# Patient Record
Sex: Male | Born: 1945 | Race: White | Hispanic: No | Marital: Married | Smoking: Never smoker
Health system: Southern US, Community
[De-identification: ages and names within clinical notes are randomized; demographics above are authoritative.]

## PROBLEM LIST (undated history)

## (undated) DIAGNOSIS — N486 Induration penis plastica: Secondary | ICD-10-CM

## (undated) DIAGNOSIS — Z8679 Personal history of other diseases of the circulatory system: Secondary | ICD-10-CM

## (undated) DIAGNOSIS — Z9889 Other specified postprocedural states: Secondary | ICD-10-CM

## (undated) DIAGNOSIS — Z79899 Other long term (current) drug therapy: Secondary | ICD-10-CM

## (undated) DIAGNOSIS — I4819 Other persistent atrial fibrillation: Secondary | ICD-10-CM

## (undated) DIAGNOSIS — M109 Gout, unspecified: Secondary | ICD-10-CM

## (undated) DIAGNOSIS — Z5181 Encounter for therapeutic drug level monitoring: Secondary | ICD-10-CM

## (undated) DIAGNOSIS — R079 Chest pain, unspecified: Secondary | ICD-10-CM

## (undated) DIAGNOSIS — E785 Hyperlipidemia, unspecified: Secondary | ICD-10-CM

## (undated) DIAGNOSIS — Z951 Presence of aortocoronary bypass graft: Secondary | ICD-10-CM

## (undated) HISTORY — DX: Hyperlipidemia, unspecified: E78.5

## (undated) HISTORY — DX: Chest pain, unspecified: R07.9

## (undated) HISTORY — PX: TOE SURGERY: SHX1073

## (undated) HISTORY — DX: Gout, unspecified: M10.9

## (undated) HISTORY — PX: OTHER SURGICAL HISTORY: SHX169

## (undated) HISTORY — DX: Induration penis plastica: N48.6

## (undated) HISTORY — DX: Other persistent atrial fibrillation: I48.19

---

## 2013-01-08 DIAGNOSIS — H2510 Age-related nuclear cataract, unspecified eye: Secondary | ICD-10-CM | POA: Diagnosis not present

## 2014-02-11 DIAGNOSIS — H2510 Age-related nuclear cataract, unspecified eye: Secondary | ICD-10-CM | POA: Diagnosis not present

## 2014-10-16 ENCOUNTER — Ambulatory Visit (INDEPENDENT_AMBULATORY_CARE_PROVIDER_SITE_OTHER): Payer: Medicare Other | Admitting: Urgent Care

## 2014-10-16 ENCOUNTER — Encounter: Payer: Self-pay | Admitting: Urgent Care

## 2014-10-16 VITALS — BP 136/60 | HR 59 | Temp 98.3°F | Ht 72.25 in | Wt 221.8 lb

## 2014-10-16 DIAGNOSIS — S70362A Insect bite (nonvenomous), left thigh, initial encounter: Secondary | ICD-10-CM | POA: Diagnosis not present

## 2014-10-16 DIAGNOSIS — W57XXXA Bitten or stung by nonvenomous insect and other nonvenomous arthropods, initial encounter: Secondary | ICD-10-CM

## 2014-10-16 NOTE — Progress Notes (Signed)
    MRN: [id number] DOB: 1945-12-30  Subjective:   Hector Clark is a 69 y.o. male presenting for chief complaint of Insect Bite  Reports tick bite he noticed last night. 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 Has not tried any medications for relief. Denies fevers, cough, conjunctivitis, rash over arms or legs, bulls eye rash, nausea, vomiting, abdominal pain, headache. Denies any other aggravating or relieving factors, no other questions or concerns.  Hector Clark currently has no medications in their medication list. He has No Known Allergies.  [name]  has no past medical history on file. Also  has no past surgical history on file.  ROS As in subjective.  Objective:   Vitals: BP 136/60 mmHg  Pulse 59  Temp(Src) 98.3 F (36.8 C) (Oral)  Ht 6' 0.25" (1.835 m)  Wt 221 lb 12.8 oz (100.608 kg)  BMI 29.88 kg/m2  SpO2 98%  Physical Exam  Constitutional: He appears well-developed and well-nourished.  HENT:  Mouth/Throat: Oropharynx is clear and moist.  Eyes: Conjunctivae are normal. Right eye exhibits no discharge. Left eye exhibits no discharge.  Cardiovascular: Normal rate, regular rhythm and intact distal pulses.  Exam reveals no gallop and no friction rub.   No murmur heard. Pulmonary/Chest: No respiratory distress. He has no wheezes. He has no rales. He exhibits no tenderness.  Lymphadenopathy:    He has no cervical adenopathy.  Neurological: He is alert.  Skin: Skin is warm and dry. No rash (except lesions as in diagram) noted. No erythema (except as in diagram). No pallor.     Psychiatric: He has a normal mood and affect.   Assessment and Plan :   1. Tick bite of left thigh, initial encounter - Stable, counseled patient on RMSF and Lyme Disease. Patient agreed to rtc if these symptoms developed. Also counseled patient on developing cellulitis secondary to break in skin from tick bite. Patient will call if this happens. Otherwise, f/u as  needed.  Jaynee Eagles, PA-C [facility] [phone] 10/16/2014 8:43 AM

## 2014-10-16 NOTE — Patient Instructions (Signed)
[place] Spotted Fever [place] Spotted Fever (RMSF) is the oldest known tick-borne disease of people in the [place]. This disease was named because it was first described among people in the [facility] area who had an illness characterized by a rash with red-purple-black spots. This disease is caused by a rickettsia (Rickettsia rickettsii), a bacteria carried by the tick. The [facility] wood tick and the American dog tick acquire and transmit the RMSF bacteria (pictures NOT actual size). When a larval, nymphal, or adult tick feeds on an infected rodent or larger animal, the tick can become infected. Infected adult ticks then feed on people who may then get RMSF. The tick transmits the disease to humans during a prolonged period of feeding that lasts many hours, days, or even a couple weeks. The bite is painless and frequently goes unnoticed. An infected male tick may also pass the rickettsial bacteria to her eggs that then may mature to be infected adult ticks. The rickettsia that causes RMSF can also get into a person's body through damaged skin. A tick bite is not necessary. People can get RMSF if they crush a tick and get its blood or body fluids on their skin through a small cut or sore.  DIAGNOSIS Diagnosis is made by laboratory tests.  TREATMENT Treatment is with antibiotics (medications that kill rickettsia and other bacteria). Immediate treatment usually prevents death. GEOGRAPHIC RANGE This disease was reported only in the [facility] until 1931. RMSF has more recently been described among individuals in all states except [place], [place], and [place]. The highest reported incidences of RMSF now occur among residents of [place], [place], [place], and the [place]. TIME OF YEAR  Most cases are diagnosed during late spring and summer when ticks are most active. However, especially in the warmer [place] states, a few cases occur during the winter. SYMPTOMS    Symptoms of RMSF begin from 2 to 14 days after a tick bite. The most common early symptoms are fever, muscle aches, and headache followed by nausea (feeling sick to your stomach) or vomiting.  The RMSF rash is typically delayed until 3 or more days after symptom onset, and eventually develops in 9 of 10 infected patients by the fifth day of illness. If the disease is not treated it can cause death. If you get a fever, headache, muscle aches, rash, nausea, or vomiting within 2 weeks of a possible tick bite or exposure, you should see your caregiver immediately. PREVENTION Ticks prefer to hide in shady, moist ground litter. They can often be found above the ground clinging to tall grass, brush, shrubs and low tree branches. They also inhabit lawns and gardens, especially at the edges of woodlands and around old stone walls. Within the areas where ticks generally live, no naturally vegetated area can be considered completely free of infected ticks. The best precaution against RMSF is to avoid contact with soil, leaf litter, and vegetation as much as possible in tick-infested areas. For those who enjoy gardening or walking in their yards, clear brush and mow tall grass around houses and at the edges of gardens. This may help reduce the tick population in the immediate area. Applications of chemical insecticides by a licensed professional in the spring (late May) and fall (September) will also control ticks, especially in heavily infested areas. Treatment will never get rid of all the ticks. Getting rid of small animal populations that host ticks will also decrease the tick population. When working in the garden, [employer]  shrubs, or handling soil and vegetation, wear light-colored protective clothing and gloves. Spot-check often to prevent ticks from reaching the skin. Ticks cannot jump or fly. They will not drop from an above-ground perch onto a passing animal. Once a tick gains access to human skin it climbs  upward until it reaches a more protected area. For example, the back of the knee, groin, navel, armpit, ears, or nape of the neck. It then begins the slow process of embedding itself in the skin. Campers, hikers, field workers, and others who spend time in wooded, brushy, or tall grassy areas can avoid exposure to ticks by using the following precautions:  Wear light-colored clothing with a tight weave to spot ticks more easily and prevent contact with the skin.  Wear long pants tucked into socks, long-sleeved shirts tucked into pants and enclosed shoes or boots along with insect repellent.  Spray clothes with insect repellent containing either DEET or Permethrin. Only DEET can be used on exposed skin. Follow the manufacturer's directions carefully.  Wear a hat and keep long hair pulled back.  Stay on cleared, well-worn trails whenever possible.  Spot-check yourself and others often for the presence of ticks on clothes. If you find one, there are likely to be others. Check thoroughly.  Remove clothes after leaving tick-infested areas. If possible, wash them to eliminate any unseen ticks. Check yourself, your children and any pets from head to toe for the presence of ticks.  Shower and shampoo. You can greatly reduce your chances of contracting RMSF if you remove attached ticks as soon as possible. Regular checks of the body, including all body sites covered by hair (head, armpits, genitals), allow removal of the tick before rickettsial transmission. To remove an attached tick, use a forceps or tweezers to detach the intact tick without leaving mouth parts in the skin. The tick bite wound should be cleansed after tick removal. Remember the most common symptoms of RMSF are fever, muscle aches, headache, and nausea or vomiting with a later onset of rash. If you get these symptoms after a tick bite and while living in an area where RMSF is found, RMSF should be suspected. If the disease is not  treated, it can cause death. See your caregiver immediately if you get these symptoms. Do this even if not aware of a tick bite. Document Released: 08/19/2000 Document Revised: 09/21/2013 Document Reviewed: 04/11/2009 [facility] Patient Information 2015 [place], [place]. This information is not intended to replace advice given to you by your health care provider. Make sure you discuss any questions you have with your health care provider.    Lyme Disease You may have been bitten by a tick and are to watch for the development of Lyme Disease. Lyme Disease is an infection that is caused by a bacteria The bacteria causing this disease is named Borreilia burgdorferi. If a tick is infected with this bacteria and then bites you, then Lyme Disease may occur. These ticks are carried by deer and rodents such as rabbits and mice and infest grassy as well as forested areas. Fortunately most tick bites do not cause Lyme Disease.  Lyme Disease is easier to prevent than to treat. First, covering your legs with clothing when walking in areas where ticks are possibly abundant will prevent their attachment because ticks tend to stay within inches of the ground. Second, using insecticides containing DEET can be applied on skin or clothing. Last, because it takes about 12 to 24 hours for the tick to  transmit the disease after attachment to the human host, you should inspect your body for ticks twice a day when you are in areas where Lyme Disease is common. You must look thoroughly when searching for ticks. The Ixodes tick that carries Lyme Disease is very small. It is around the size of a sesame seed (picture of tick is not actual size). Removal is best done by grasping the tick by the head and pulling it out. Do not to squeeze the body of the tick. This could inject the infecting bacteria into the bite site. Wash the area of the bite with an antiseptic solution after removal.  Lyme Disease is a disease that may affect many body  systems. Because of the small size of the biting tick, most people do not notice being bitten. The first sign of an infection is usually a round red rash that extends out from the center of the tick bite. The center of the lesion may be blood colored (hemorrhagic) or have tiny blisters (vesicular). Most lesions have bright red outer borders and partial central clearing. This rash may extend out many inches in diameter, and multiple lesions may be present. Other symptoms such as fatigue, headaches, chills and fever, general achiness and swelling of lymph glands may also occur. If this first stage of the disease is left untreated, these symptoms may gradually resolve by themselves, or progressive symptoms may occur because of spread of infection to other areas of the body.  Follow up with your caregiver to have testing and treatment if you have a tick bite and you develop any of the above complaints. Your caregiver may recommend preventative (prophylactic) medications which kill bacteria (antibiotics). Once a diagnosis of Lyme Disease is made, antibiotic treatment is highly likely to cure the disease. Effective treatment of late stage Lyme Disease may require longer courses of antibiotic therapy.  MAKE SURE YOU:   Understand these instructions.  Will watch your condition.  Will get help right away if you are not doing well or get worse. Document Released: 08/13/2000 Document Revised: 07/30/2011 Document Reviewed: 10/15/2008 [facility] Patient Information 2015 [place], [place]. This information is not intended to replace advice given to you by your health care provider. Make sure you discuss any questions you have with your health care provider.

## 2014-10-28 NOTE — Progress Notes (Signed)
  Medical screening examination/treatment/procedure(s) were performed by non-physician practitioner and as supervising physician I was immediately available for consultation/collaboration.     

## 2015-11-17 DIAGNOSIS — H5213 Myopia, bilateral: Secondary | ICD-10-CM | POA: Diagnosis not present

## 2015-11-17 DIAGNOSIS — H52223 Regular astigmatism, bilateral: Secondary | ICD-10-CM | POA: Diagnosis not present

## 2015-11-17 DIAGNOSIS — D4981 Neoplasm of unspecified behavior of retina and choroid: Secondary | ICD-10-CM | POA: Diagnosis not present

## 2015-11-17 DIAGNOSIS — H2513 Age-related nuclear cataract, bilateral: Secondary | ICD-10-CM | POA: Diagnosis not present

## 2015-11-17 DIAGNOSIS — H524 Presbyopia: Secondary | ICD-10-CM | POA: Diagnosis not present

## 2016-09-17 DIAGNOSIS — R05 Cough: Secondary | ICD-10-CM | POA: Diagnosis not present

## 2016-09-17 DIAGNOSIS — R21 Rash and other nonspecific skin eruption: Secondary | ICD-10-CM | POA: Diagnosis not present

## 2016-09-25 DIAGNOSIS — B370 Candidal stomatitis: Secondary | ICD-10-CM | POA: Diagnosis not present

## 2016-10-01 ENCOUNTER — Ambulatory Visit
Admission: RE | Admit: 2016-10-01 | Discharge: 2016-10-01 | Disposition: A | Discharge status: Home / Self Care | Payer: Medicare Other | Source: Ambulatory Visit | Attending: Internal Medicine | Admitting: Internal Medicine

## 2016-10-01 ENCOUNTER — Other Ambulatory Visit: Payer: Self-pay | Admitting: Internal Medicine

## 2016-10-01 DIAGNOSIS — R059 Cough, unspecified: Secondary | ICD-10-CM

## 2016-10-01 DIAGNOSIS — R05 Cough: Secondary | ICD-10-CM

## 2016-10-01 DIAGNOSIS — R938 Abnormal findings on diagnostic imaging of other specified body structures: Secondary | ICD-10-CM | POA: Diagnosis not present

## 2016-10-01 DIAGNOSIS — J189 Pneumonia, unspecified organism: Secondary | ICD-10-CM | POA: Diagnosis not present

## 2016-10-22 ENCOUNTER — Other Ambulatory Visit: Payer: Self-pay | Admitting: Internal Medicine

## 2016-10-22 ENCOUNTER — Ambulatory Visit
Admission: RE | Admit: 2016-10-22 | Discharge: 2016-10-22 | Disposition: A | Discharge status: Home / Self Care | Payer: Medicare Other | Source: Ambulatory Visit | Attending: Internal Medicine | Admitting: Internal Medicine

## 2016-10-22 DIAGNOSIS — J189 Pneumonia, unspecified organism: Secondary | ICD-10-CM

## 2016-11-09 DIAGNOSIS — R079 Chest pain, unspecified: Secondary | ICD-10-CM | POA: Diagnosis not present

## 2016-12-03 ENCOUNTER — Encounter: Payer: Self-pay | Admitting: *Deleted

## 2016-12-14 ENCOUNTER — Ambulatory Visit (INDEPENDENT_AMBULATORY_CARE_PROVIDER_SITE_OTHER): Payer: Medicare Other | Admitting: Internal Medicine

## 2016-12-14 ENCOUNTER — Encounter: Payer: Self-pay | Admitting: *Deleted

## 2016-12-14 VITALS — BP 118/62 | HR 125 | Ht 72.25 in | Wt 226.0 lb

## 2016-12-14 DIAGNOSIS — I480 Paroxysmal atrial fibrillation: Secondary | ICD-10-CM

## 2016-12-14 DIAGNOSIS — R0789 Other chest pain: Secondary | ICD-10-CM | POA: Diagnosis not present

## 2016-12-14 MED ORDER — METOPROLOL TARTRATE 25 MG PO TABS: ORAL_TABLET | ORAL | 1 refills | Status: DC

## 2016-12-14 NOTE — Progress Notes (Signed)
New Outpatient Visit Date: 12/14/2016  Referring Provider: Lavone Orn, MD [address] [place] [address], [place] [address]  Chief Complaint: Chest pain  HPI:  Hector Clark is a 71 y.o. male who is being seen today for the evaluation of chest pain at the request of Dr. Laurann Montana. He has a history of hyperlipidemia (not currently on therapy) and gout. For approximately 10 years, Hector Clark has had episodic chest pains. The first episode occurred while playing golf. He developed a cramping sensation in the chest rating to both arms and shoulders. The pain would come on spontaneously without relation to exertion or other activities. It is maximal intensity has been 4/10, with the pain typically resolving on its own after about 45 seconds. However, for the next 5-10 minutes, Hector Clark reports feeling "cruddy." Shortly after the symptoms began, he underwent myocardial perfusion stress test that was normal per his report. He also reports a left bundle branch block in the past. Since the symptoms started about 10 years ago, he has periodically experienced the same chest pain up to 2-3 times per year. Most recent episode occurred in June and was witnessed by his wife. She encouraged him to be evaluated by Dr. Laurann Montana, who has referred Hector Clark to [place]. At the time of Dr. Delene Clark assessment, evaluation was normal, including EKG demonstrating sinus bradycardia without significant abnormalities.  Over the last month, Hector Clark has not had any further chest pain. He also denies shortness of breath, palpitations, lightheadedness, edema, orthopnea, and PND. 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 At times, he has exertional dyspnea when climbing up stairs carrying 3 laptop's. Up until about 8-10 years ago, he was very active, including training for marathons and triathlons.  Hector Clark denies a history of arrhythmia, though he  notes in the past when exercising, his heart monitor would occasionally go "berserk," showing fast and irregular heartbeats.  --------------------------------------------------------------------------------------------------  Cardiovascular History & Procedures: Cardiovascular Problems:  Atypical chest pain  Atrial fibrillation  Risk Factors:  Male gender, hyperlipidemia, and age greater than 15  Cath/PCI:  None  CV Surgery:  None  EP Procedures and Devices:  None  Non-Invasive Evaluation(s):  Myocardial perfusion stress test approximately 10 years ago: Normal per patient report.  Recent CV Pertinent Labs: No results found for: CHOL, HDL, LDLCALC, LDLDIRECT, TRIG, CHOLHDL, INR, BNP, K, MG, BUN, CREATININE  --------------------------------------------------------------------------------------------------  Past Medical History:  Diagnosis Date  . Chest pain    with nagative cardiolite  . Gout    occational flare  . Hyperlipemia   . Peyronie disease     Past Surgical History:  Procedure Laterality Date  . bone graph    . TOE SURGERY Left    4th toe  . tophus      No outpatient prescriptions have been marked as taking for the 12/14/16 encounter (Office Visit) with Sherley Mckenney, Harrell Gave, MD.    Allergies: Patient has no known allergies.  Social History   Social History  . Marital status: Married    Spouse name: N/A  . Number of children: N/A  . Years of education: N/A   Occupational History  . Not on file.   Social History Main Topics  . Smoking status: Never Smoker  . Smokeless tobacco: Never Used  . Alcohol use Yes     Comment: 2-3 weekly  . Drug use: No  . Sexual activity: Not on file   Other  Topics Concern  . Not on file   Social History Narrative  . No narrative on file    Family History  Problem Relation Age of Onset  . Liver cancer Mother 42  . Esophageal cancer Father 53  . Healthy Daughter   . Heart disease Neg Hx     Review  of Systems: A 12-system review of systems was performed and was negative except as noted in the HPI.  --------------------------------------------------------------------------------------------------  Physical Exam: BP 118/62   Pulse (!) 125   Ht 6' 0.25" (1.835 m)   Wt 226 lb (102.5 kg)   SpO2 98%   BMI 30.44 kg/m   General:  Overweight man, seated comfortably in the exam room. HEENT: No conjunctival pallor or scleral icterus. Moist mucous membranes. OP clear. Neck: Supple without lymphadenopathy, thyromegaly, JVD, or HJR. No carotid bruit. Lungs: Normal work of breathing. Clear to auscultation bilaterally without wheezes or crackles. Heart: Tachycardic and irregularly irregular. No murmurs or rubs.. Non-displaced PMI. Abd: Bowel sounds present. Soft, NT/ND without hepatosplenomegaly Ext: No lower extremity edema. Radial, PT, and DP pulses are 2+ bilaterally Skin: Warm and dry without rash. Neuro: CNIII-XII intact. Strength and fine-touch sensation intact in upper and lower extremities bilaterally. Psych: Normal mood and affect.  EKG:  Atrial fibrillation with rapid ventricular response (ventricular rate 120 bpm). Compared with prior tracing from 11/09/16, atrial fibrillation has replaced sinus bradycardia (heart rate 57 bpm).  No results found for: WBC, HGB, HCT, MCV, PLT  No results found for: NA, K, CL, CO2, BUN, CREATININE, GLUCOSE, ALT  No results found for: CHOL, HDL, LDLCALC, LDLDIRECT, TRIG, CHOLHDL   --------------------------------------------------------------------------------------------------  ASSESSMENT AND PLAN: Atypical chest pain Chest pain has been long-standing and very infrequent, happening about 2-3 times a year. The fact that the pain typically occurs at rest and resolves within 45 seconds, argues against coronary insufficiency. However, Hector Clark has several cardiac risk factors, including age, male gender, and history of hyperlipidemia (though he  does not recall a history of elevated cholesterol). Given finding of new atrial fibrillation today, I would like to improve his heart rate control and obtain a transthoracic echocardiogram first. If his LVEF is normal, we will proceed with a myocardial perfusion stress test to exclude ischemia. If his LVEF is depressed, we will proceed with cardiac catheterization instead. I have asked Hector Clark to start taking aspirin 81 mg daily. We will also add low-dose metoprolol.  New onset atrial fibrillation Hector Clark does not have any symptoms despite being in atrial fibrillation with rapid ventricular response. This has not been documented in the past, though the report of his heart monitor going "berserk" in the past suggests that he may have had paroxysmal atrial fibrillation for years. We have agreed to start low-dose aspirin given his CHADSVASC score of 1 (age). We will also start metoprolol tartrate 12.5 mg twice a day for heart rate control. I will have him return to the clinic early next week for a repeat EKG to reassess his heart rate control. We will check a CBC, CMP, and TSH today.  Follow-up: Return to clinic in 1 month.  Nelva Bush, MD 12/14/2016 4:46 PM

## 2016-12-14 NOTE — Patient Instructions (Signed)
Medication Instructions:  Start aspirin 81 mg daily  Start metoprolol tartrate 12.5 mg two times a day This will be 1/2 of a 25 mg tablet two times a day  Labwork: CMET/CBCd/TSH/Magnesium level today  Testing/Procedures: Your physician has requested that you have an echocardiogram. Echocardiography is a painless test that uses sound waves to create images of your heart. It provides your doctor with information about the size and shape of your heart and how well your heart's chambers and valves are working. This procedure takes approximately one hour. There are no restrictions for this procedure.    Follow-Up: Your physician recommends that you schedule a follow-up appointment the first of the week for an EKG.  Your physician recommends that you schedule a follow-up appointment in: 1 month with Dr End.         If you need a refill on your cardiac medications before your next appointment, please call your pharmacy.

## 2016-12-15 DIAGNOSIS — I4819 Other persistent atrial fibrillation: Secondary | ICD-10-CM | POA: Insufficient documentation

## 2016-12-15 DIAGNOSIS — R0789 Other chest pain: Secondary | ICD-10-CM | POA: Insufficient documentation

## 2016-12-15 LAB — CBC WITH DIFFERENTIAL/PLATELET
BASOS ABS: 0.1 10*3/uL (ref 0.0–0.2)
Basos: 1 %
EOS (ABSOLUTE): 0.1 10*3/uL (ref 0.0–0.4)
Eos: 2 %
HEMOGLOBIN: 16.7 g/dL (ref 13.0–17.7)
Hematocrit: 50.2 % (ref 37.5–51.0)
Immature Grans (Abs): 0 10*3/uL (ref 0.0–0.1)
Immature Granulocytes: 0 %
LYMPHS ABS: 3.3 10*3/uL — AB (ref 0.7–3.1)
LYMPHS: 35 %
MCH: 30 pg (ref 26.6–33.0)
MCHC: 33.3 g/dL (ref 31.5–35.7)
MCV: 90 fL (ref 79–97)
Monocytes Absolute: 0.6 10*3/uL (ref 0.1–0.9)
Monocytes: 7 %
Neutrophils Absolute: 5.3 10*3/uL (ref 1.4–7.0)
Neutrophils: 55 %
PLATELETS: 211 10*3/uL (ref 150–379)
RBC: 5.57 x10E6/uL (ref 4.14–5.80)
RDW: 13.7 % (ref 12.3–15.4)
WBC: 9.4 10*3/uL (ref 3.4–10.8)

## 2016-12-15 LAB — COMPREHENSIVE METABOLIC PANEL
ALT: 23 IU/L (ref 0–44)
AST: 21 IU/L (ref 0–40)
Albumin/Globulin Ratio: 1.5 (ref 1.2–2.2)
Albumin: 4.7 g/dL (ref 3.6–4.8)
Alkaline Phosphatase: 119 IU/L — ABNORMAL HIGH (ref 39–117)
BUN/Creatinine Ratio: 15 (ref 10–24)
BUN: 14 mg/dL (ref 8–27)
Bilirubin Total: 0.5 mg/dL (ref 0.0–1.2)
CALCIUM: 9.8 mg/dL (ref 8.6–10.2)
CO2: 19 mmol/L — AB (ref 20–29)
CREATININE: 0.95 mg/dL (ref 0.76–1.27)
Chloride: 102 mmol/L (ref 96–106)
GFR calc Af Amer: 94 mL/min/{1.73_m2} (ref >59)
GFR, EST NON AFRICAN AMERICAN: 81 mL/min/{1.73_m2} (ref >59)
GLOBULIN, TOTAL: 3.1 g/dL (ref 1.5–4.5)
Glucose: 98 mg/dL (ref 65–99)
Potassium: 4.6 mmol/L (ref 3.5–5.2)
SODIUM: 141 mmol/L (ref 134–144)
Total Protein: 7.8 g/dL (ref 6.0–8.5)

## 2016-12-15 LAB — MAGNESIUM: MAGNESIUM: 2.1 mg/dL (ref 1.6–2.3)

## 2016-12-15 LAB — TSH: TSH: 2.85 u[IU]/mL (ref 0.450–4.500)

## 2016-12-20 ENCOUNTER — Ambulatory Visit (HOSPITAL_COMMUNITY): Payer: Medicare Other | Attending: Cardiovascular Disease

## 2016-12-20 ENCOUNTER — Other Ambulatory Visit: Payer: Self-pay

## 2016-12-20 ENCOUNTER — Ambulatory Visit (INDEPENDENT_AMBULATORY_CARE_PROVIDER_SITE_OTHER): Payer: Medicare Other | Admitting: *Deleted

## 2016-12-20 VITALS — BP 90/64 | HR 105

## 2016-12-20 DIAGNOSIS — I340 Nonrheumatic mitral (valve) insufficiency: Secondary | ICD-10-CM | POA: Diagnosis not present

## 2016-12-20 DIAGNOSIS — I480 Paroxysmal atrial fibrillation: Secondary | ICD-10-CM

## 2016-12-20 DIAGNOSIS — R0789 Other chest pain: Secondary | ICD-10-CM

## 2016-12-20 LAB — ECHOCARDIOGRAM COMPLETE

## 2016-12-20 MED ORDER — METOPROLOL TARTRATE 25 MG PO TABS: ORAL_TABLET | ORAL | 1 refills | Status: DC

## 2016-12-20 NOTE — Patient Instructions (Signed)
Pt to increased the Metoprolol medication to 25 mg twice a day. Pt has an appointment with the A-Fib clinic with [name] on August 9 th at 11:30 AM  A-Fib clinic phone [phone].

## 2016-12-20 NOTE — Progress Notes (Signed)
1.) Reason for visit: 12 lead EKG  2.) Name of MD requesting visit: Dr.Christopher End  3.) H&P: Pt has a history of chest pain.  4.) ROS related to problem:Atrial fibrillation  5.) Assessment and plan per MD:EKG done per CMA read per DR Marlou Porch MD. A-fib rate 107 beats/ minute, BP left arm 91/63. Dr Marlou Porch recommends to increased Metoprolol to 25 mg twice a day and to be F/U with [name] in the A-Fib clinic in  Week.. Pt has an appointment  At the A-Fib clinic on August 9 th at 11:30 AM. Pt is aware.Hector Clark

## 2016-12-24 ENCOUNTER — Telehealth: Payer: Self-pay | Admitting: *Deleted

## 2016-12-24 DIAGNOSIS — I480 Paroxysmal atrial fibrillation: Secondary | ICD-10-CM

## 2016-12-24 DIAGNOSIS — R0789 Other chest pain: Secondary | ICD-10-CM

## 2016-12-24 NOTE — Telephone Encounter (Signed)
Notes recorded by End, Harrell Gave, MD on 12/23/2016 at 8:35 PM EDT Please let Hector Clark know that his echo shows normal pumping function of his heart. Mild leakage of the mitral valve was noted, which is unlikely to explain his symptoms. I recommend that we proceed with a pharmacologic myocardial perfusion stress test. He should follow-up with the a-fib clinic and me as previously arranged. Thanks.

## 2016-12-27 ENCOUNTER — Ambulatory Visit (HOSPITAL_COMMUNITY)
Admission: RE | Admit: 2016-12-27 | Discharge: 2016-12-27 | Disposition: A | Discharge status: Home / Self Care | Payer: Medicare Other | Source: Ambulatory Visit | Attending: Nurse Practitioner | Admitting: Nurse Practitioner

## 2016-12-27 ENCOUNTER — Encounter (HOSPITAL_COMMUNITY): Payer: Self-pay | Admitting: Nurse Practitioner

## 2016-12-27 VITALS — BP 112/70 | HR 117 | Ht 72.25 in | Wt 226.2 lb

## 2016-12-27 DIAGNOSIS — I4819 Other persistent atrial fibrillation: Secondary | ICD-10-CM

## 2016-12-27 DIAGNOSIS — Z79899 Other long term (current) drug therapy: Secondary | ICD-10-CM | POA: Diagnosis not present

## 2016-12-27 DIAGNOSIS — Z7982 Long term (current) use of aspirin: Secondary | ICD-10-CM | POA: Diagnosis not present

## 2016-12-27 DIAGNOSIS — I4891 Unspecified atrial fibrillation: Secondary | ICD-10-CM | POA: Diagnosis present

## 2016-12-27 DIAGNOSIS — E785 Hyperlipidemia, unspecified: Secondary | ICD-10-CM | POA: Diagnosis not present

## 2016-12-27 DIAGNOSIS — I481 Persistent atrial fibrillation: Secondary | ICD-10-CM | POA: Diagnosis not present

## 2016-12-27 NOTE — Progress Notes (Signed)
22

## 2016-12-28 NOTE — Progress Notes (Addendum)
Primary Care Physician: Hector Orn, MD Referring Physician: Dr. Saunders Clark Cardiologist: Dr. Shirlyn Clark Hector Clark is a 71 y.o. male with a h/o hyperlipidemia that recently has been diagnosed with afib, although pt has been having episodes of irregular heart beat for years but short lived. Twice he can remember in the last year that he had fast heart beat following playing golf and drinking a beer. However, he is does not consume a lot of alcohol, caffeine. No tobacco. Does not think he snores. He has a chadsvasc score of , not on anticoagulation. He has noted smore fatigue and dyspnea with activities. HR is 117 today but misses am metoprolol. He is not watching BP or HR at home. He usually runs a low blood pressure. He has also reported to Dr. Saunders Clark that he has chest, neck, shoulder discomfort when he moves the yard and is relieved with rest. He is pending a atress test. Echo with normal EF, mild MR.   Today, he denies symptoms of palpitations, chest pain, shortness of breath, orthopnea, PND, lower extremity edema, dizziness, presyncope, syncope, or neurologic sequela. The patient is tolerating medications without difficulties and is otherwise without complaint today.   Past Medical History:  Diagnosis Date  . Chest pain    with nagative cardiolite  . Gout    occational flare  . Hyperlipemia   . Peyronie disease    Past Surgical History:  Procedure Laterality Date  . bone graph    . TOE SURGERY Left    4th toe  . tophus      Current Outpatient Prescriptions  Medication Sig Dispense Refill  . aspirin EC 81 MG tablet Take 1 tablet (81 mg total) by mouth daily.    . metoprolol tartrate (LOPRESSOR) 25 MG tablet 1 tablet  by mouth two times a day 90 tablet 1   No current facility-administered medications for this encounter.     No Known Allergies  Social History   Social History  . Marital status: Married    Spouse name: N/A  . Number of children: N/A  . Years of education: N/A    Occupational History  . Not on file.   Social History Main Topics  . Smoking status: Never Smoker  . Smokeless tobacco: Never Used  . Alcohol use Yes     Comment: 1 beer per month  . Drug use: No  . Sexual activity: Not on file   Other Topics Concern  . Not on file   Social History Narrative  . No narrative on file    Family History  Problem Relation Age of Onset  . Liver cancer Mother 45  . Esophageal cancer Father 74  . Healthy Daughter   . Heart disease Neg Hx     ROS- All systems are reviewed and negative except as per the HPI above  Physical Exam: Vitals:   12/27/16 1129  BP: 112/70  Pulse: (!) 117  Weight: 226 lb 3.2 oz (102.6 kg)  Height: 6' 0.25" (1.835 m)   Wt Readings from Last 3 Encounters:  12/27/16 226 lb 3.2 oz (102.6 kg)  12/14/16 226 lb (102.5 kg)  10/16/14 221 lb 12.8 oz (100.6 kg)    Labs: Lab Results  Component Value Date   NA 141 12/14/2016   K 4.6 12/14/2016   CL 102 12/14/2016   CO2 19 (L) 12/14/2016   GLUCOSE 98 12/14/2016   BUN 14 12/14/2016   CREATININE 0.95 12/14/2016   CALCIUM 9.8 12/14/2016  MG 2.1 12/14/2016   No results found for: INR No results found for: CHOL, HDL, LDLCALC, TRIG   GEN- The patient is well appearing, alert and oriented x 3 today.   Head- normocephalic, atraumatic Eyes-  Sclera clear, conjunctiva pink Ears- hearing intact Oropharynx- clear Neck- supple, no JVP Lymph- no cervical lymphadenopathy Lungs- Clear to ausculation bilaterally, normal work of breathing Heart- irregular rate and rhythm, no murmurs, rubs or gallops, PMI not laterally displaced GI- soft, NT, ND, + BS Extremities- no clubbing, cyanosis, or edema MS- no significant deformity or atrophy Skin- no rash or lesion Psych- euthymic mood, full affect Neuro- strength and sensation are intact  EKG- afib at 117 bpm, Qrs int 88 ms, qtc 460 Echo- Study Conclusions  - Left ventricle: The cavity size was normal. Wall thickness was    normal. Systolic function was normal. The estimated ejection   fraction was in the range of 50% to 55%. The study is not   technically sufficient to allow evaluation of LV diastolic   function. - Mitral valve: There was mild regurgitation. - Atrial septum: No defect or patent foramen ovale was identified.   Assessment and Plan: 1. New onset persistent afib General education re afib management Poorly rate controlled today but missed am metoprolol dose Pt would prefer not to increase med or add new med at this time He will check his HR and BP over the next few days and inform office if his HR is consistently over 100 bpm, for consideration for increase in metoprolol, if BP is adequate Chadsvasc score is 1, not on anticoagulation, on ASA Explained to pt since he is proving to be persistent, he would require anticoagulation with a DOAC for 3 weeks before cardioversion or AAD could be attempted He defers for now He would like to get thru stress test scheduled for next Friday and then he will see if he wants to  go on anticoagulation  F/u with Hector Clark, Hector Clark 8/31 afib clinic as needed   Hector Penny C. Mekai Clark, [facility] [address] [place], [place] [address] [phone]

## 2016-12-31 ENCOUNTER — Encounter: Payer: Self-pay | Admitting: Physician Assistant

## 2017-01-01 ENCOUNTER — Telehealth (HOSPITAL_COMMUNITY): Payer: Self-pay | Admitting: *Deleted

## 2017-01-01 NOTE — Telephone Encounter (Signed)
Patient given detailed instructions per Myocardial Perfusion Study Information Sheet for the test on 01/01/17 Patient notified to arrive 15 minutes early and that it is imperative to arrive on time for appointment to keep from having the test rescheduled.  If you need to cancel or reschedule your appointment, please call the office within 24 hours of your appointment. . Patient verbalized understanding. Kirstie Peri

## 2017-01-04 ENCOUNTER — Ambulatory Visit (HOSPITAL_COMMUNITY): Payer: Medicare Other | Attending: Internal Medicine

## 2017-01-04 DIAGNOSIS — I480 Paroxysmal atrial fibrillation: Secondary | ICD-10-CM | POA: Insufficient documentation

## 2017-01-04 DIAGNOSIS — R0789 Other chest pain: Secondary | ICD-10-CM | POA: Diagnosis not present

## 2017-01-04 MED ORDER — REGADENOSON 0.4 MG/5ML IV SOLN
0.4000 mg | Freq: Once | INTRAVENOUS | Status: AC
  Administered 2017-01-04: 0.4 mg via INTRAVENOUS

## 2017-01-04 MED ORDER — TECHNETIUM TC 99M TETROFOSMIN IV KIT
32.4000 | PACK | Freq: Once | INTRAVENOUS | Status: AC | PRN
  Administered 2017-01-04: 32.4 via INTRAVENOUS
  Filled 2017-01-04: qty 33

## 2017-01-04 MED ORDER — TECHNETIUM TC 99M TETROFOSMIN IV KIT
10.1000 | PACK | Freq: Once | INTRAVENOUS | Status: AC | PRN
  Administered 2017-01-04: 10.1 via INTRAVENOUS
  Filled 2017-01-04: qty 11

## 2017-01-07 LAB — MYOCARDIAL PERFUSION IMAGING
CHL CUP NUCLEAR SDS: 6
CHL CUP NUCLEAR SRS: 3
CHL CUP NUCLEAR SSS: 9
CSEPPHR: 111 {beats}/min
RATE: 0.31
Rest HR: 88 {beats}/min
TID: 1.16

## 2017-01-18 ENCOUNTER — Encounter: Payer: Self-pay | Admitting: Physician Assistant

## 2017-01-18 ENCOUNTER — Ambulatory Visit (INDEPENDENT_AMBULATORY_CARE_PROVIDER_SITE_OTHER): Payer: Medicare Other | Admitting: Physician Assistant

## 2017-01-18 VITALS — BP 98/60 | HR 98 | Ht 73.5 in | Wt 229.8 lb

## 2017-01-18 DIAGNOSIS — I481 Persistent atrial fibrillation: Secondary | ICD-10-CM | POA: Diagnosis not present

## 2017-01-18 DIAGNOSIS — R0789 Other chest pain: Secondary | ICD-10-CM

## 2017-01-18 DIAGNOSIS — I4819 Other persistent atrial fibrillation: Secondary | ICD-10-CM

## 2017-01-18 NOTE — Patient Instructions (Signed)
Medication Instructions:  Your physician recommends that you continue on your current medications as directed. Please refer to the Current Medication list given to you today.   Labwork: NONE ORDERED   Testing/Procedures: 1. Your physician has recommended that you wear a 24 HOUR holter monitor. Holter monitors are medical devices that record the heart's electrical activity. Doctors most often use these monitors to diagnose arrhythmias. Arrhythmias are problems with the speed or rhythm of the heartbeat. The monitor is a small, portable device. You can wear one while you do your normal daily activities. This is usually used to diagnose what is causing palpitations/syncope (passing out).    Follow-Up: DR. END IN 6-8 WEEKS  Any Other Special Instructions Will Be Listed Below (If Applicable).     If you need a refill on your cardiac medications before your next appointment, please call your pharmacy.

## 2017-01-18 NOTE — Progress Notes (Signed)
Cardiology Office Note:    Date:  01/18/2017   ID:  Hector Clark, DOB [birth year], MRN [id number]  PCP:  Lavone Orn, MD  Cardiologist:  Dr. Nelva Bush    Referring MD: Lavone Orn, MD   Chief Complaint  Patient presents with  . Atrial Fibrillation    follow up    History of Present Illness:    Hector Clark is a 71 y.o. male who was evaluated by Dr. Saunders Revel 12/14/16 for chest discomfort. At that visit, he was noted to be in AF with RVR.  CHADS2-VASc=1 (age).  He was placed on ASA and beta-blocker for rate control.   He was evaluated in the [facility] 12/27/16. Heart rate was still uncontrolled at that time. However, the patient deferred adjusting his rate controlling medications. He also deferred consideration for short-term anticoagulation and elective cardioversion. Echo 12/20/16 demonstrated normal LV function and mild MR. Stress testing 01/04/17 was low risk and negative for ischemia.  Hector Clark returns for follow-up. He is here alone. He notes a long history of dyspnea with exertion. This has been going on for about 4-5 years. He denies any significant change. He denies a significant change in his occasional chest discomfort. He denies syncope, orthopnea, PND or edema. He denies any bleeding issues.  Prior CV studies:   The following studies were reviewed today:  Nuclear stress test 01/04/17 Low risk stress nuclear study with apical thinning vs small prior infarct; no significant ischemia; study not gated due to atrial fibrillation.  Echo 12/20/16 EF 50-55, mild MR  Past Medical History:  Diagnosis Date  . Chest pain    with nagative cardiolite  . Gout    occational flare  . Hyperlipemia   . Peyronie disease     Past Surgical History:  Procedure Laterality Date  . bone graph    . TOE SURGERY Left    4th toe  . tophus      Current Medications: Current Meds  Medication Sig  . aspirin EC 81 MG tablet Take 1 tablet (81 mg total) by mouth  daily.  . metoprolol tartrate (LOPRESSOR) 25 MG tablet 1 tablet  by mouth two times a day     Allergies:   Patient has no known allergies.   Social History   Social History  . Marital status: Married    Spouse name: N/A  . Number of children: N/A  . Years of education: N/A   Social History Main Topics  . Smoking status: Never Smoker  . Smokeless tobacco: Never Used  . Alcohol use Yes     Comment: 1 beer per month  . Drug use: No  . Sexual activity: Not Asked   Other Topics Concern  . None   Social History Narrative  . None     Family Hx: The patient's family history includes Esophageal cancer (age of onset: 69) in his father; Healthy in his daughter; Liver cancer (age of onset: 33) in his mother. There is no history of Heart disease.  ROS:   Please see the history of present illness.    Review of Systems  Cardiovascular: Positive for irregular heartbeat.   All other systems reviewed and are negative.   EKGs/Labs/Other Test Reviewed:    EKG:  EKG is  ordered today.  The ekg ordered today demonstrates Atrial fibrillation, HR 99  Recent Labs: 12/14/2016: ALT 23; BUN 14; Creatinine, Ser 0.95; Hemoglobin 16.7; Magnesium 2.1; Platelets 211; Potassium 4.6; Sodium 141; TSH 2.850  Recent Lipid Panel No results found for: CHOL, TRIG, HDL, CHOLHDL, LDLCALC, LDLDIRECT  Physical Exam:    VS:  BP 98/60   Pulse 98   Ht 6' 1.5" (1.867 m)   Wt 229 lb 12.8 oz (104.2 kg)   SpO2 97%   BMI 29.91 kg/m     Wt Readings from Last 3 Encounters:  01/18/17 229 lb 12.8 oz (104.2 kg)  12/27/16 226 lb 3.2 oz (102.6 kg)  12/14/16 226 lb (102.5 kg)     Physical Exam  Constitutional: He is oriented to person, place, and time. He appears well-developed and well-nourished. No distress.  HENT:  Head: Normocephalic and atraumatic.  Eyes: No scleral icterus.  Neck: No JVD present.  Cardiovascular: Normal rate.  An irregularly irregular rhythm present.  No murmur  heard. Pulmonary/Chest: Effort normal. He has no rales.  Abdominal: Soft. There is no tenderness.  Musculoskeletal: He exhibits no edema.  Neurological: He is alert and oriented to person, place, and time.  Skin: Skin is warm and dry.  Psychiatric: He has a normal mood and affect.    ASSESSMENT:    1. Persistent atrial fibrillation ([place])   2. Atypical chest pain    PLAN:    In order of problems listed above:  1. Persistent atrial fibrillation [facility]) Mr. Golob remains in atrial fibrillation. Heart rate is somewhat better controlled today. He remains on Metoprolol 25 mg twice a day.  CHADS2-VASc=1.  He remains on aspirin. I question if his dyspnea with exertion is a symptom related to atrial fibrillation. He was in normal sinus rhythm in June when he saw his PCP. However, he has been in atrial fibrillation since that time. We had a long discussion regarding whether or not to proceed with elective cardioversion. He would require short-term anticoagulation around this. I also discussed the possibility of placing him on flecainide to maintain rhythm control. Overall, at this point, he would prefer to continue his current therapy and consider options. I have asked him to undergo 24-hour Holter monitor to assess heart rate control. If his heart rate is uncontrolled, we may need to consider proceeding with cardioversion sooner or adding something like digoxin. However, I would prefer to not use digoxin in someone with normal LV function. I will have him follow-up with Dr. Saunders Revel in 2 months.   2. Atypical chest pain Chronic occ chest pain. Recent Nuclear stress test low risk.    Total time spent with patient today 30 minutes. This includes reviewing records, evaluating the patient and coordinating care. Face-to-face time >50%.   Dispo:  Return in about 8 weeks (around 03/15/2017) for Routine Follow Up, w/ Dr. Saunders Revel.   Medication Adjustments/Labs and Tests Ordered: Current medicines are reviewed  at length with the patient today.  Concerns regarding medicines are outlined above.  Tests Ordered: Orders Placed This Encounter  Procedures  . Holter monitor - 24 hour  . EKG 12-Lead   Medication Changes: No orders of the defined types were placed in this encounter.   Signed, [name], PA-C  01/18/2017 9:50 AM    [facility] [place], [place], [place]  [address] Phone: [phone]; Fax: [phone]

## 2017-01-22 ENCOUNTER — Ambulatory Visit (INDEPENDENT_AMBULATORY_CARE_PROVIDER_SITE_OTHER): Payer: Medicare Other

## 2017-01-22 DIAGNOSIS — I481 Persistent atrial fibrillation: Secondary | ICD-10-CM

## 2017-01-22 DIAGNOSIS — I4819 Other persistent atrial fibrillation: Secondary | ICD-10-CM

## 2017-01-28 ENCOUNTER — Telehealth: Payer: Self-pay | Admitting: Physician Assistant

## 2017-01-28 NOTE — Telephone Encounter (Signed)
Please call patient.  Dr. Harrell Gave End reviewed his Holter monitor. Hector Clark has a lot of heart rate variability and some fast beats that may arise from his L ventricle. We think that we should try to go ahead and attempt cardioversion to get him back in rhythm. We will need to start anticoagulation and then see him back in 4 weeks to schedule the DCCV. Please set him up to see me in the next week or 2.  It would be good to put him on with me a day that Dr. Saunders Revel is in the office.  We can decide on which anticoagulant to take and discuss the procedure in detail at that visit. [name], PA-C    01/28/2017 4:14 PM

## 2017-01-29 NOTE — Telephone Encounter (Signed)
PER SCOTT W. PT TO BE SEEN SAME DAY DR. END IS IN THE OFFICE TO DISCUSS POSSIBLE DCCV, NEED TO SET UP ON ANTICOAG. PT AGREEABLE TO PLAN OF CARE.Marland KitchenMarland KitchenCMF

## 2017-01-29 NOTE — Telephone Encounter (Signed)
Follow Up:; ° ° °Returning your call. °

## 2017-02-08 ENCOUNTER — Encounter: Payer: Self-pay | Admitting: Physician Assistant

## 2017-02-08 ENCOUNTER — Encounter: Payer: Self-pay | Admitting: *Deleted

## 2017-02-08 ENCOUNTER — Ambulatory Visit (INDEPENDENT_AMBULATORY_CARE_PROVIDER_SITE_OTHER): Payer: Medicare Other | Admitting: Physician Assistant

## 2017-02-08 VITALS — BP 102/60 | HR 101 | Ht 73.5 in | Wt 226.8 lb

## 2017-02-08 DIAGNOSIS — I4819 Other persistent atrial fibrillation: Secondary | ICD-10-CM

## 2017-02-08 DIAGNOSIS — R0789 Other chest pain: Secondary | ICD-10-CM | POA: Diagnosis not present

## 2017-02-08 DIAGNOSIS — I481 Persistent atrial fibrillation: Secondary | ICD-10-CM | POA: Diagnosis not present

## 2017-02-08 MED ORDER — RIVAROXABAN 20 MG PO TABS: 20.0000 mg | ORAL_TABLET | Freq: Every day | ORAL | 6 refills | Status: DC

## 2017-02-08 MED ORDER — METOPROLOL SUCCINATE ER 25 MG PO TB24: 25.0000 mg | ORAL_TABLET | Freq: Every evening | ORAL | 3 refills | Status: DC

## 2017-02-08 NOTE — Progress Notes (Signed)
Cardiology Office Note:    Date:  02/08/2017   ID:  Hector Clark, DOB 09/23/1945, MRN [id number]  PCP:  Lavone Orn, MD  Cardiologist:  Dr. Nelva Bush    Referring MD: Lavone Orn, MD   Chief Complaint  Patient presents with  . Atrial Fibrillation    follow up    History of Present Illness:    Hector Clark is a 71 y.o. male with a hx of chest pain, persistent atrial fibrillation.  CHADS2-VASc=1 (age).  He has been maintained on beta blocker and aspirin only. He has deferred consideration of short-term anticoagulation and elective cardioversion. Echocardiogram in 8/18 demonstrated normal LV function with mild MR. Stress testing in 8/18 was negative for ischemia. Last seen 01/18/17. Holter monitor was arranged to assess for rate control. This demonstrated a lot of heart rate ability and wide-complex tachycardia (NSVT versus AF with aberrancy).  This was reviewed by Dr. Saunders Revel who felt that the patient would benefit from restoration of normal sinus rhythm.  Hector Clark returns to discuss anticoagulation and subsequent DCCV.  He is here with his wife.  His symptoms have remained the same.  He did have another episode of chest pain 2 weeks ago. This was prolonged.  He did not take anything for it.  He denies exertional chest pain. He notes dyspnea on exertion that is unchanged.  He denies synocope but did get lightheaded today when getting up from the exam table.  He denies paroxysmal nocturnal dyspnea, edema, bleeding issues.   Prior CV studies:   The following studies were reviewed today:  Holter monitor 01/22/17  The patient was monitored for 29 hours, 35 minutes.  The predominant rhythm was atrial fibrillation with an average rate of 94 bpm (range 51-164 bpm). The longest RR interval was 2.1 seconds.  Occasional PVCs versus aberrancy were identified. 132 wide-complex runs were noted, lasting up to 17 beats, with a maximal rate of 179 bpm.  Atrial fibrillation burden  was 100%.  Diary events corresponded to atrial fibrillation with ventricular rates in the 90s. Atrial fibrillation with episodes of PVCs and nonsustained ventricular tachycardia versus aberrancy.  Nuclear stress test 01/04/17 Low risk stress nuclear study with apical thinning vs small prior infarct; no significant ischemia; study not gated due to atrial fibrillation.  Echo 12/20/16 EF 50-55, mild MR  Past Medical History:  Diagnosis Date  . Chest pain    with nagative cardiolite  . Gout    occational flare  . Hyperlipemia   . Peyronie disease     Past Surgical History:  Procedure Laterality Date  . bone graph    . TOE SURGERY Left    4th toe  . tophus      Current Medications: Current Meds  Medication Sig  . [DISCONTINUED] aspirin EC 81 MG tablet Take 1 tablet (81 mg total) by mouth daily.  . [DISCONTINUED] metoprolol tartrate (LOPRESSOR) 25 MG tablet 1 tablet  by mouth two times a day     Allergies:   Patient has no known allergies.   Social History   Social History  . Marital status: Married    Spouse name: N/A  . Number of children: N/A  . Years of education: N/A   Social History Main Topics  . Smoking status: Never Smoker  . Smokeless tobacco: Never Used  . Alcohol use Yes     Comment: 1 beer per month  . Drug use: No  . Sexual activity: Not Asked   Other Topics Concern  .  None   Social History Narrative  . None     Family Hx: The patient's family history includes Esophageal cancer (age of onset: 54) in his father; Healthy in his daughter; Liver cancer (age of onset: 34) in his mother. There is no history of Heart disease.  ROS:   Please see the history of present illness.    Review of Systems  Cardiovascular: Positive for chest pain and dyspnea on exertion.   All other systems reviewed and are negative.   EKGs/Labs/Other Test Reviewed:    EKG:  EKG is  ordered today.  The ekg ordered today demonstrates Atrial fibrillation, HR 101  Recent  Labs: 12/14/2016: ALT 23; BUN 14; Creatinine, Ser 0.95; Hemoglobin 16.7; Magnesium 2.1; Platelets 211; Potassium 4.6; Sodium 141; TSH 2.850   Recent Lipid Panel No results found for: CHOL, TRIG, HDL, CHOLHDL, LDLCALC, LDLDIRECT  Physical Exam:    VS:  BP 102/60   Pulse (!) 101   Ht 6' 1.5" (1.867 m)   Wt 226 lb 12.8 oz (102.9 kg)   SpO2 97%   BMI 29.52 kg/m     Wt Readings from Last 3 Encounters:  02/08/17 226 lb 12.8 oz (102.9 kg)  01/18/17 229 lb 12.8 oz (104.2 kg)  12/27/16 226 lb 3.2 oz (102.6 kg)     Physical Exam  Constitutional: He is oriented to person, place, and time. He appears well-developed and well-nourished. No distress.  HENT:  Head: Normocephalic and atraumatic.  Eyes: No scleral icterus.  Neck: Normal range of motion. No JVD present.  Cardiovascular: Normal rate, S1 normal and S2 normal.  An irregularly irregular rhythm present.  No murmur heard. Pulmonary/Chest: Effort normal and breath sounds normal. He has no wheezes. He has no rhonchi. He has no rales.  Abdominal: Soft. There is no hepatomegaly.  Musculoskeletal: He exhibits no edema.  Neurological: He is alert and oriented to person, place, and time.  Skin: Skin is warm and dry.  Psychiatric: He has a normal mood and affect.    ASSESSMENT:    1. Persistent atrial fibrillation ([facility])   2. Other chest pain    PLAN:    In order of problems listed above:  1. Persistent atrial fibrillation ([facility])  CHADS2-VASc=1.  As noted, recent Holter monitor demonstrated wide variability in his heart rate as well as wide complex tachycardia suspicious for an SVT versus atrial fibrillation with aberrancy. Recommendation has been to proceed with cardioversion. The patient does not require long-term anticoagulation given his low risk factor profile. However, he will need short-term anticoagulation to proceed with cardioversion. Risks and benefits of the procedure were explained to the patient and his wife today. Risks  include but are not limited to high-grade heart block requiring pacemaker implantation, skin irritation and untoward side effects/reactions to anesthesia. He is willing to proceed. He has had some dizziness and his blood pressure tends to run low. I will change his metoprolol to metoprolol succinate 25 mg every afternoon. He will stop aspirin. I will start him on Xarelto 20 mg daily. We will arrange elective cardioversion in 3-4 weeks. A BMET will be obtained a week if his procedure. Follow-up will be arranged 2 weeks after his procedure.  2. Chest pain Chest pain remains atypical. He had a recent low risk Myoview. I have asked him to try antiacid therapy to see if this helps.   Dispo:  Return in about 2 weeks (around 02/22/2017) for Post Procedure Follow Up, w/ Dr. Saunders Revel.   Medication  Adjustments/Labs and Tests Ordered: Current medicines are reviewed at length with the patient today.  Concerns regarding medicines are outlined above.  Tests Ordered: Orders Placed This Encounter  Procedures  . Basic Metabolic Panel (BMET)  . EKG 12-Lead   Medication Changes: Meds ordered this encounter  Medications  . metoprolol succinate (TOPROL XL) 25 MG 24 hr tablet    Sig: Take 1 tablet (25 mg total) by mouth every evening.    Dispense:  90 tablet    Refill:  3  . rivaroxaban (XARELTO) 20 MG TABS tablet    Sig: Take 1 tablet (20 mg total) by mouth daily with supper.    Dispense:  30 tablet    Refill:  6    Signed, [name], PA-C  02/08/2017 2:31 PM    [facility] [place], [place], [place]  [address] Phone: [phone]; Fax: [phone]

## 2017-02-08 NOTE — Patient Instructions (Addendum)
Medication Instructions:  1. STOP ASPIRIN 2. STOP METOPROLOL TARTRATE (LOPRESSOR) 3. START XARELTO 20 MG 1 TABLET AT DINNER TIME; START TONIGHT 4. START TOPROL XL 25 MG 1 TABLET EVERY NIGHT  Labwork: BMET 03/06/17 TO BE DONE 2 DAYS BEFORE CARDIOVERSION  Testing/Procedures: Your physician has recommended that you have a Cardioversion (DCCV). Electrical Cardioversion uses a jolt of electricity to your heart either through paddles or wired patches attached to your chest. This is a controlled, usually prescheduled, procedure. Defibrillation is done under light anesthesia in the hospital, and you usually go home the day of the procedure. This is done to get your heart back into a normal rhythm. You are not awake for the procedure. Please see the instruction sheet given to you today. 03/08/17 @ 2 PM    Follow-Up: 03/25/17 @ 8:20 WITH DR. END ; POST CARDIOVERSION FOLLOW UP  Any Other Special Instructions Will Be Listed Below (If Applicable).  PER PA YOU CAN TRY SOME OTC MYLANTA OR PEPCID FOR CHEST PAIN THE NEXT TIME IT OCCURS    If you need a refill on your cardiac medications before your next appointment, please call your pharmacy.

## 2017-02-20 ENCOUNTER — Encounter: Payer: Self-pay | Admitting: Internal Medicine

## 2017-03-02 ENCOUNTER — Telehealth: Payer: Self-pay | Admitting: Physician Assistant

## 2017-03-02 NOTE — Telephone Encounter (Signed)
I would favor delaying the cardioversion unless he strongly wishes to have it done sooner (in that case he would need to undergo TEE prior to cardioversion).  Nelva Bush, MD [facility] Pager: [phone]

## 2017-03-02 NOTE — Telephone Encounter (Signed)
Hector Clark is scheduled for cardioversion 03/08/17.  He missed his dose of Xarelto last night. I asked him to resume it today. Will forward to Dr. Harrell Gave End to see if we need to postpone the cardioversion for 3 more weeks. If we do postpone, will need to change lab work this week to draw it the week of his procedure. [name], PA-C    03/02/2017 8:58 AM

## 2017-03-04 NOTE — Telephone Encounter (Signed)
Please call Mr. Hector Clark. I reviewed with Dr. Harrell Gave End.  Since he missed one dose of Xarelto over the weekend, we need to delay his cardioversion for 3 weeks to be safe.  If this is problematic for him, we can arrange a TEE guided cardioversion.  # If he is ok to wait, please reschedule cardioversion for 3 weeks from now and reschedule lab due this week to the week of his cardioversion.  # If he prefers to have the TEE guided cardioversion, let me know and I can call him to go over risks and benefits.  Hector Dopp, PA-C    03/04/2017 8:36 AM

## 2017-03-04 NOTE — Telephone Encounter (Signed)
Follow up      Patient returning call back to nurse from last week

## 2017-03-04 NOTE — Telephone Encounter (Signed)
S/w pt in regards to needing to reschedule DCCV in regards to a missed dose of Xarelto. Pt was given the option to reschedule 3 weeks from today or doa TEE guided DCCV. Pt has opted to move DCCV out 3 weeks from today which will be 03/25/17. Pt agreeable to this new date. We will move his lab appt to 03/20/17, move Dr. Saunders Revel appt to 04/08/17 @ 8:20. I advised pt I will call back once I speak with the hospital to find out what time we are rescheduling too. Pt thanked me for my call and our help today. Pt is now scheduled for DCCV 03/25/17 @ 10 am with Dr. Radford Pax, pt to arrive at [facility] by 8:30. Pt is aware and agreeable.

## 2017-03-04 NOTE — Progress Notes (Signed)
03/04/2017 -The patient called in after missing a dose of Xarelto 03/01/17.  His cardioversion has been rescheduled for March 25, 2017. [name], PA-C    03/04/2017 4:09 PM

## 2017-03-04 NOTE — Telephone Encounter (Signed)
I have left message for ptcb per [name], PA and Dr. Saunders Revel needing to reschedule DCCV due to pt has missed 1 dose over the weekend.   Kathlen Mody, Scott T, PA-C  You 3 hours ago (8:36 AM)      Please call Mr. Eckerman. I reviewed with Dr. Harrell Gave End.  Since he missed one dose of Xarelto over the weekend, we need to delay his cardioversion for 3 weeks to be safe.  If this is problematic for him, we can arrange a TEE guided cardioversion.  # If he is ok to wait, please reschedule cardioversion for 3 weeks from now and reschedule lab due this week to the week of his cardioversion.  # If he prefers to have the TEE guided cardioversion, let me know and I can call him to go over risks and benefits.  [name], PA-C    03/04/2017 8:36 AM

## 2017-03-04 NOTE — Telephone Encounter (Signed)
Follow up    Pt is returning call. He said that he is only going to be available until 2:30pm. And then after 4pm.

## 2017-03-05 NOTE — Telephone Encounter (Signed)
Thank you for there update.  Nelva Bush, MD [facility] Pager: [phone]

## 2017-03-06 ENCOUNTER — Other Ambulatory Visit: Payer: Medicare Other

## 2017-03-08 ENCOUNTER — Telehealth: Payer: Self-pay | Admitting: Physician Assistant

## 2017-03-08 NOTE — Telephone Encounter (Signed)
.  Note not Needed

## 2017-03-15 ENCOUNTER — Ambulatory Visit: Payer: Medicare Other | Admitting: Internal Medicine

## 2017-03-20 ENCOUNTER — Other Ambulatory Visit: Payer: Medicare Other | Admitting: *Deleted

## 2017-03-20 DIAGNOSIS — I4819 Other persistent atrial fibrillation: Secondary | ICD-10-CM

## 2017-03-20 DIAGNOSIS — I481 Persistent atrial fibrillation: Secondary | ICD-10-CM | POA: Diagnosis not present

## 2017-03-21 LAB — BASIC METABOLIC PANEL
BUN / CREAT RATIO: 13 (ref 10–24)
BUN: 13 mg/dL (ref 8–27)
CO2: 23 mmol/L (ref 20–29)
CREATININE: 1.01 mg/dL (ref 0.76–1.27)
Calcium: 9.5 mg/dL (ref 8.6–10.2)
Chloride: 104 mmol/L (ref 96–106)
GFR, EST AFRICAN AMERICAN: 87 mL/min/{1.73_m2} (ref >59)
GFR, EST NON AFRICAN AMERICAN: 76 mL/min/{1.73_m2} (ref >59)
Glucose: 125 mg/dL — ABNORMAL HIGH (ref 65–99)
Potassium: 4.4 mmol/L (ref 3.5–5.2)
Sodium: 142 mmol/L (ref 134–144)

## 2017-03-25 ENCOUNTER — Other Ambulatory Visit: Payer: Self-pay

## 2017-03-25 ENCOUNTER — Encounter (HOSPITAL_COMMUNITY)
Admission: RE | Disposition: A | Discharge status: Home / Self Care | Payer: Self-pay | Source: Ambulatory Visit | Attending: Cardiology

## 2017-03-25 ENCOUNTER — Ambulatory Visit (HOSPITAL_COMMUNITY): Payer: Medicare Other | Admitting: Anesthesiology

## 2017-03-25 ENCOUNTER — Ambulatory Visit (HOSPITAL_COMMUNITY)
Admission: RE | Admit: 2017-03-25 | Discharge: 2017-03-25 | Disposition: A | Discharge status: Home / Self Care | Payer: Medicare Other | Source: Ambulatory Visit | Attending: Cardiology | Admitting: Cardiology

## 2017-03-25 ENCOUNTER — Encounter (HOSPITAL_COMMUNITY): Payer: Self-pay | Admitting: *Deleted

## 2017-03-25 ENCOUNTER — Ambulatory Visit: Payer: Medicare Other | Admitting: Internal Medicine

## 2017-03-25 DIAGNOSIS — Z7901 Long term (current) use of anticoagulants: Secondary | ICD-10-CM | POA: Diagnosis not present

## 2017-03-25 DIAGNOSIS — E785 Hyperlipidemia, unspecified: Secondary | ICD-10-CM | POA: Insufficient documentation

## 2017-03-25 DIAGNOSIS — M109 Gout, unspecified: Secondary | ICD-10-CM | POA: Diagnosis not present

## 2017-03-25 DIAGNOSIS — I4819 Other persistent atrial fibrillation: Secondary | ICD-10-CM

## 2017-03-25 DIAGNOSIS — I481 Persistent atrial fibrillation: Secondary | ICD-10-CM | POA: Insufficient documentation

## 2017-03-25 DIAGNOSIS — R0789 Other chest pain: Secondary | ICD-10-CM | POA: Diagnosis not present

## 2017-03-25 HISTORY — PX: CARDIOVERSION: SHX1299

## 2017-03-25 SURGERY — CARDIOVERSION
Anesthesia: General

## 2017-03-25 MED ORDER — PROPOFOL 10 MG/ML IV BOLUS
INTRAVENOUS | Status: DC | PRN
  Administered 2017-03-25: 60 mg via INTRAVENOUS

## 2017-03-25 MED ORDER — SODIUM CHLORIDE 0.9% FLUSH: 3.0000 mL | Freq: Two times a day (BID) | INTRAVENOUS | Status: DC

## 2017-03-25 MED ORDER — SODIUM CHLORIDE 0.9 % IV SOLN: 250.0000 mL | INTRAVENOUS | Status: DC

## 2017-03-25 MED ORDER — SODIUM CHLORIDE 0.9 % IV SOLN
INTRAVENOUS | Status: DC
  Administered 2017-03-25: 09:00:00 via INTRAVENOUS

## 2017-03-25 MED ORDER — SODIUM CHLORIDE 0.9% FLUSH: 3.0000 mL | INTRAVENOUS | Status: DC | PRN

## 2017-03-25 MED ORDER — LIDOCAINE HCL (CARDIAC) 20 MG/ML IV SOLN
INTRAVENOUS | Status: DC | PRN
  Administered 2017-03-25: 60 mg via INTRAVENOUS

## 2017-03-25 NOTE — Anesthesia Postprocedure Evaluation (Signed)
Anesthesia Post Note  Patient: Hector Clark  Procedure(s) Performed: CARDIOVERSION (N/A )     Patient location during evaluation: PACU Anesthesia Type: General Level of consciousness: awake and alert and oriented Pain management: pain level controlled Vital Signs Assessment: post-procedure vital signs reviewed and stable Respiratory status: spontaneous breathing, nonlabored ventilation and respiratory function stable Cardiovascular status: blood pressure returned to baseline and stable Postop Assessment: no apparent nausea or vomiting Anesthetic complications: no    Last Vitals:  Vitals:   03/25/17 1040 03/25/17 1050  BP: 108/75 105/77  Pulse: 64 65  Resp: (!) 21 (!) 21  Temp:    SpO2: 98% 99%    Last Pain:  Vitals:   03/25/17 1017  TempSrc: Oral                 Shuronda Santino A.

## 2017-03-25 NOTE — CV Procedure (Signed)
   Electrical Cardioversion Procedure Note Hector Clark [id number] Mar 02, 1946  Procedure: Electrical Cardioversion Indications:  Atrial Fibrillation  Time Out: Verified patient identification, verified procedure,medications/allergies/relevent history reviewed, required imaging and test results available.  Performed  Procedure Details  The patient was NPO after midnight. Anesthesia was administered at the beside  by Dr.Foster with 60mg  of propofol and 60mg  of Lidocaine.  Cardioversion was done with synchronized biphasic defibrillation with AP pads with 150watts.  The patient failed to convert to normal sinus rhythm. Cardioversion was done with synchronized biphasic defibrillation with AP pads with 200watts.The patient successfully converted to NSR.  The patient tolerated the procedure well   IMPRESSION:  Successful cardioversion of atrial fibrillation/Flutter    Traci Turner 03/25/2017, 9:41 AM

## 2017-03-25 NOTE — H&P (Addendum)
Cardiology Admission History and Physical:   Patient ID: Hector Clark; MRN: [id number]; DOB: [birth year]   Admission date: 03/25/2017  Primary Care Provider: Lavone Orn, MD Primary Cardiologist: Dr. Saunders Revel Primary Electrophysiologist:  none  Chief Complaint:  Atrial fibrillation   History of Present Illness:   Hector Clark  is a 71 y.o. male with a hx of chest pain, persistent atrial fibrillation.  CHADS2-VASc=1 (age). He has been maintained on beta blocker and aspirin only. He  deferred consideration of short-term anticoagulation and elective cardioversion. Echocardiogram in 8/18 demonstrated normal LV function with mild MR. Stress testing in 8/18 was negative for ischemia. Last seen 01/18/17. Holter monitor was arranged to assess for rate control. This demonstrated a lot of heart rate ability and wide-complex tachycardia (NSVT versus AF with aberrancy).  This was reviewed by Dr. Saunders Revel who felt that the patient would benefit from restoration of normal sinus rhythm.  Hector Clark returned to discuss anticoagulation and subsequent DCCV with [name], PA on 02/08/2017.  He apparently missed a dose of Xarelto and his DCCV had to be rescheduled to today.  He is doing well today.  He denies any chest pain, pressure, SOB, DOE, LE edema, dizziness or syncope.    Past Medical History:  Diagnosis Date  . Chest pain    with nagative cardiolite  . Gout    occational flare  . Hyperlipemia   . Peyronie disease     Past Surgical History:  Procedure Laterality Date  . bone graph    . TOE SURGERY Left    4th toe  . tophus       Medications Prior to Admission: Prior to Admission medications   Medication Sig Start Date End Date Taking? Authorizing Provider  metoprolol succinate (TOPROL XL) 25 MG 24 hr tablet Take 1 tablet (25 mg total) by mouth every evening. 02/08/17  Yes Weaver, Scott T, PA-C  rivaroxaban (XARELTO) 20 MG TABS tablet Take 1 tablet (20 mg total) by mouth daily with  supper. 02/08/17  Yes Weaver, Scott T, PA-C  vitamin C (ASCORBIC ACID) 500 MG tablet Take 500 mg by mouth 2 (two) times daily.   Yes [provider]  vitamin E 400 UNIT capsule Take 400 Units by mouth daily.   Yes [provider]     Allergies:   No Known Allergies  Social History:   Social History   Socioeconomic History  . Marital status: Married    Spouse name: Not on file  . Number of children: Not on file  . Years of education: Not on file  . Highest education level: Not on file  Social Needs  . Financial resource strain: Not on file  . Food insecurity - worry: Not on file  . Food insecurity - inability: Not on file  . Transportation needs - medical: Not on file  . Transportation needs - non-medical: Not on file  Occupational History  . Not on file  Tobacco Use  . Smoking status: Never Smoker  . Smokeless tobacco: Never Used  Substance and Sexual Activity  . Alcohol use: Yes    Comment: 1 beer per month  . Drug use: No  . Sexual activity: Not on file  Other Topics Concern  . Not on file  Social History Narrative  . Not on file    Family History:   The patient's family history includes Esophageal cancer (age of onset: 1) in his father; Healthy in his daughter; Liver cancer (age of onset: 69)  in his mother. There is no history of Heart disease.    ROS:  Please see the history of present illness.  All other ROS reviewed and negative.     Physical Exam/Data:   Vitals:   03/25/17 0840  BP: (!) 112/91  Pulse: (!) 108  Resp: 16  Temp: 97.8 F (36.6 C)  TempSrc: Oral  SpO2: 99%  Weight: 226 lb (102.5 kg)  Height: 6' 1.5" (1.867 m)   No intake or output data in the 24 hours ending 03/25/17 0904 Filed Weights   03/25/17 0840  Weight: 226 lb (102.5 kg)   Body mass index is 29.41 kg/m.  General:  Well nourished, well developed, in no acute distress HEENT: normal Lymph: no adenopathy Neck: no2 JVD Endocrine:  No thryomegaly Vascular: No  carotid bruits; FA pulses 2+ bilaterally without bruits  Cardiac:  normal S1, S2; irregularly irregular; no murmur  Lungs:  clear to auscultation bilaterally, no wheezing, rhonchi or rales  Abd: soft, nontender, no hepatomegaly  Ext: no edema Musculoskeletal:  No deformities, BUE and BLE strength normal and equal Skin: warm and dry  Neuro:  CNs 2-12 intact, no focal abnormalities noted Psych:  Normal affect    EKG:  The ECG that was done and was personally reviewed and demonstrates atrial fibrillation  Relevant CV Studies: 2D echo Study Conclusions  - Left ventricle: The cavity size was normal. Wall thickness was   normal. Systolic function was normal. The estimated ejection   fraction was in the range of 50% to 55%. The study is not   technically sufficient to allow evaluation of LV diastolic   function. - Mitral valve: There was mild regurgitation. - Atrial septum: No defect or patent foramen ovale was identifie  Laboratory Data:  Chemistry Recent Labs  Lab 03/20/17 0749  NA 142  K 4.4  CL 104  CO2 23  GLUCOSE 125*  BUN 13  CREATININE 1.01  CALCIUM 9.5  GFRNONAA 76  GFRAA 87    No results for input(s): PROT, ALBUMIN, AST, ALT, ALKPHOS, BILITOT in the last 168 hours. HematologyNo results for input(s): WBC, RBC, HGB, HCT, MCV, MCH, MCHC, RDW, PLT in the last 168 hours. Cardiac EnzymesNo results for input(s): TROPONINI in the last 168 hours. No results for input(s): TROPIPOC in the last 168 hours.  BNPNo results for input(s): BNP, PROBNP in the last 168 hours.  DDimer No results for input(s): DDIMER in the last 168 hours.  Radiology/Studies:  No results found.  Assessment and Plan:   1. Persistent atrial fibrillation (HCC)  CHADS2-VASc=1.  As noted, recent Holter monitor demonstrated wide variability in his heart rate as well as wide complex tachycardia suspicious for an SVT versus atrial fibrillation with aberrancy. Recommendation has been to proceed with  cardioversion. The patient does not require long-term anticoagulation given his low risk factor profile. However, he will need short-term anticoagulation to proceed with cardioversion.  - he has been on Xarelto for 3 weeks with no missed doses - proceed with DCCV today   Severity of Illness: The appropriate patient status for this patient is OBSERVATION. Observation status is judged to be reasonable and necessary in order to provide the required intensity of service to ensure the patient's safety. The patient's presenting symptoms, physical exam findings, and initial radiographic and laboratory data in the context of their medical condition is felt to place them at decreased risk for further clinical deterioration. Furthermore, it is anticipated that the patient will be medically stable for  discharge from the hospital within 2 midnights of admission. The following factors support the patient status of observation.   " The patient's presenting symptoms include atrial fibrillation. " The physical exam findings include irregularly heart rate. " The initial radiographic and laboratory data are none.     For questions or updates, please contact [place] Please consult [email or web address] for contact info under Cardiology/STEMI.    Signed, Fransico Him, MD  03/25/2017 9:04 AM

## 2017-03-25 NOTE — Anesthesia Preprocedure Evaluation (Signed)
Anesthesia Evaluation  Patient identified by MRN, date of birth, ID band Patient awake    Reviewed: Allergy & Precautions, NPO status , Patient's Chart, lab work & pertinent test results, reviewed documented beta blocker date and time   Airway Mallampati: II  TM Distance: >3 FB Neck ROM: Full    Dental no notable dental hx. (+) Teeth Intact   Pulmonary neg pulmonary ROS,    Pulmonary exam normal breath sounds clear to auscultation       Cardiovascular Normal cardiovascular exam+ dysrhythmias Atrial Fibrillation  Rhythm:Regular Rate:Normal  Echo 12/20/16: Left ventricle: The cavity size was normal. Wall thickness was normal. Systolic function was normal. The estimated ejection fraction was in the range of 50% to 55%. The study is not technically sufficient to allow evaluation of LV diastolic function. - Mitral valve: There was mild regurgitation. - Atrial septum: No defect or patent foramen ovale was identified.   Neuro/Psych negative neurological ROS  negative psych ROS   GI/Hepatic negative GI ROS,   Endo/Other  Hyperlipidemia Gout  Renal/GU negative Renal ROS  negative genitourinary   Musculoskeletal   Abdominal   Peds  Hematology Xarelto- last dose 11/4   Anesthesia Other Findings   Reproductive/Obstetrics Peyronie's Disease                             Anesthesia Physical Anesthesia Plan  ASA: III  Anesthesia Plan: General   Post-op Pain Management:    Induction: Intravenous  PONV Risk Score and Plan: 2  Airway Management Planned: Natural Airway and Mask  Additional Equipment:   Intra-op Plan:   Post-operative Plan:   Informed Consent: I have reviewed the patients History and Physical, chart, labs and discussed the procedure including the risks, benefits and alternatives for the proposed anesthesia with the patient or authorized representative who has indicated his/her  understanding and acceptance.   Dental advisory given  Plan Discussed with: CRNA, Anesthesiologist and Surgeon  Anesthesia Plan Comments:         Anesthesia Quick Evaluation

## 2017-03-25 NOTE — Discharge Instructions (Signed)
Electrical Cardioversion, Care After °This sheet gives you information about how to care for yourself after your procedure. Your health care provider may also give you more specific instructions. If you have problems or questions, contact your health care provider. °What can I expect after the procedure? °After the procedure, it is common to have: °· Some redness on the skin where the shocks were given. ° °Follow these instructions at home: °· Do not drive for 24 hours if you were given a medicine to help you relax (sedative). °· Take over-the-counter and prescription medicines only as told by your health care provider. °· Ask your health care provider how to check your pulse. Check it often. °· Rest for 48 hours after the procedure or as told by your health care provider. °· Avoid or limit your caffeine use as told by your health care provider. °Contact a health care provider if: °· You feel like your heart is beating too quickly or your pulse is not regular. °· You have a serious muscle cramp that does not go away. °Get help right away if: °· You have discomfort in your chest. °· You are dizzy or you feel faint. °· You have trouble breathing or you are short of breath. °· Your speech is slurred. °· You have trouble moving an arm or leg on one side of your body. °· Your fingers or toes turn cold or blue. °This information is not intended to replace advice given to you by your health care provider. Make sure you discuss any questions you have with your health care provider. °Document Released: 02/25/2013 Document Revised: 12/09/2015 Document Reviewed: 11/11/2015 °Elsevier Interactive Patient Education © 2018 Elsevier Inc. ° °

## 2017-03-25 NOTE — Transfer of Care (Signed)
Immediate Anesthesia Transfer of Care Note  Patient: Hector Clark  Procedure(s) Performed: CARDIOVERSION (N/A )  Patient Location: Endoscopy Unit  Anesthesia Type:General  Level of Consciousness: awake, alert  and sedated  Airway & Oxygen Therapy: Patient connected to nasal cannula oxygen  Post-op Assessment: Post -op Vital signs reviewed and stable  Post vital signs: stable  Last Vitals:  Vitals:   03/25/17 0840  BP: (!) 112/91  Pulse: (!) 108  Resp: 16  Temp: 36.6 C  SpO2: 99%    Last Pain:  Vitals:   03/25/17 0840  TempSrc: Oral         Complications: No apparent anesthesia complications

## 2017-03-27 ENCOUNTER — Encounter: Payer: Self-pay | Admitting: Physician Assistant

## 2017-04-08 ENCOUNTER — Ambulatory Visit (INDEPENDENT_AMBULATORY_CARE_PROVIDER_SITE_OTHER): Payer: Medicare Other | Admitting: Internal Medicine

## 2017-04-08 ENCOUNTER — Encounter: Payer: Self-pay | Admitting: Internal Medicine

## 2017-04-08 VITALS — BP 100/64 | HR 67 | Ht 73.5 in | Wt 228.2 lb

## 2017-04-08 DIAGNOSIS — I481 Persistent atrial fibrillation: Secondary | ICD-10-CM

## 2017-04-08 DIAGNOSIS — I4819 Other persistent atrial fibrillation: Secondary | ICD-10-CM

## 2017-04-08 DIAGNOSIS — R0789 Other chest pain: Secondary | ICD-10-CM | POA: Diagnosis not present

## 2017-04-08 NOTE — Progress Notes (Signed)
Follow-up Outpatient Visit Date: 04/08/2017  Primary Care Provider: Lavone Orn, MD [place] [address] [place] [address]  Chief Complaint: Follow-up atrial fibrillation  HPI:  Mr. Bonfield is a 71 y.o. year-old male with history of persistent atrial fibrillation, hyperlipidemia, and gout, who presents for follow-up of atrial fibrillation. I met Mr. Handshoe in July for evaluation of chest pain. At the time of our visit, Mr. Crombie was found to be in atrial fibrillation with a rapid ventricular response. We subsequently attempted rate control, though Holter monitor showed variable heart rate response including some wide complex runs (question of aberrancy versus nonsustained ventricular tachycardia). After completing one month of anticoagulation with rivaroxaban, Mr. Dolley underwent successful cardioversion on 03/25/17. About 2 days after his cardioversion, Mr. Sequeira reports feeling his heart pounding; he suspected that he reverted back to atrial fibrillation at that time. He notes continued exertional dyspnea and fatigue, though better than at our initial visit in July. It is most noticeable when walking up a hill on his way to work.  Mr. Budney has not had any further episodes of chest pain. He also denies lightheadedness, palpitations, edema, and orthopnea. Mr. Lax is tolerating metoprolol and rivaroxaban well, though he would ideally like to come of all medications. He brings up the possibility antiarrhythmic therapy, as a friend of his has been treated with sotalol with good results.  --------------------------------------------------------------------------------------------------  Cardiovascular History & Procedures: Cardiovascular Problems:  Atypical chest pain  Atrial fibrillation  Risk Factors:  Male gender, hyperlipidemia, and age greater than 68  Cath/PCI:  None  CV Surgery:  None  EP Procedures and Devices:  DCCV  (03/25/17): Successful cardioversion to NSR  24-hour Holter monitor (01/22/17): Atrial fibrillation with episodes of PVCs and nonsustained ventricular tachycardia versus aberrancy.  Non-Invasive Evaluation(s):  Pharmacologic MPI (01/04/17): Low risk study with apical thinning versus small infarct. No ischemia. Study not gated due to atrial fibrillation.  TTE (12/20/16): Normal LV size and wall thickness. LVEF 50-55%. Unable to assess diastolic function. Mild mitral regurgitation. Normal RV size and function.  Myocardial perfusion stress test approximately 10 years ago: Normal per patient report.  Recent CV Pertinent Labs: Lab Results  Component Value Date   K 4.4 03/20/2017   MG 2.1 12/14/2016   BUN 13 03/20/2017   CREATININE 1.01 03/20/2017    Past medical and surgical history were reviewed and updated in EPIC.  Current Meds  Medication Sig  . metoprolol succinate (TOPROL XL) 25 MG 24 hr tablet Take 1 tablet (25 mg total) by mouth every evening.  . rivaroxaban (XARELTO) 20 MG TABS tablet Take 1 tablet (20 mg total) by mouth daily with supper.  . vitamin C (ASCORBIC ACID) 500 MG tablet Take 500 mg by mouth 2 (two) times daily.  . vitamin E 400 UNIT capsule Take 400 Units by mouth daily.    Allergies: Patient has no known allergies.  Social History   Socioeconomic History  . Marital status: Married    Spouse name: Not on file  . Number of children: Not on file  . Years of education: Not on file  . Highest education level: Not on file  Social Needs  . Financial resource strain: Not on file  . Food insecurity - worry: Not on file  . Food insecurity - inability: Not on file  . Transportation needs - medical: Not on file  . Transportation needs - non-medical: Not on file  Occupational History  . Not on file  Tobacco  Use  . Smoking status: Never Smoker  . Smokeless tobacco: Never Used  Substance and Sexual Activity  . Alcohol use: Yes    Comment: 1 beer per month  . Drug  use: No  . Sexual activity: Not on file  Other Topics Concern  . Not on file  Social History Narrative  . Not on file    Family History  Problem Relation Age of Onset  . Liver cancer Mother 10  . Esophageal cancer Father 42  . Healthy Daughter   . Heart disease Neg Hx     Review of Systems: A 12-system review of systems was performed and was negative except as noted in the HPI.  --------------------------------------------------------------------------------------------------  Physical Exam: BP 100/64   Pulse 67   Ht 6' 1.5" (1.867 m)   Wt 228 lb 3.2 oz (103.5 kg)   SpO2 97%   BMI 29.70 kg/m   General:  Overweight man, seated comfortably in the exam room. HEENT: No conjunctival pallor or scleral icterus. Moist mucous membranes.  OP clear. Neck: Supple without lymphadenopathy, thyromegaly, JVD, or HJR. No carotid bruit. Lungs: Normal work of breathing. Clear to auscultation bilaterally without wheezes or crackles. Heart: Irregularly irregular without murmurs or rubs. Non-displaced PMI. Abd: Bowel sounds present. Soft, NT/ND without hepatosplenomegaly Ext: No lower extremity edema. Radial, PT, and DP pulses are 2+ bilaterally. Skin: Warm and dry without rash.  EKG:  Atrial fibrillation (ventricular rate 97 bpm). Otherwise, no significant abnormalities.  Lab Results  Component Value Date   WBC 9.4 12/14/2016   HGB 16.7 12/14/2016   HCT 50.2 12/14/2016   MCV 90 12/14/2016   PLT 211 12/14/2016    Lab Results  Component Value Date   NA 142 03/20/2017   K 4.4 03/20/2017   CL 104 03/20/2017   CO2 23 03/20/2017   BUN 13 03/20/2017   CREATININE 1.01 03/20/2017   GLUCOSE 125 (H) 03/20/2017   ALT 23 12/14/2016    No results found for: CHOL, HDL, LDLCALC, LDLDIRECT, TRIG, CHOLHDL  --------------------------------------------------------------------------------------------------  ASSESSMENT AND PLAN: Persistent atrial fibrillation Mr. Zilka underwent  successful cardioversion earlier this month but unfortunately has reverted back to atrial fibrillation. Ventricular rate is reasonably well controlled and further uptitration of metoprolol is precluded by borderline low blood pressure. As Mr. Laufer continues to have mild symptoms (predominantly exertional dyspnea) and wishes to discuss options for rhythm control, we have agreed to refer him to Dr. Rayann Heman for further evaluation and management of his atrial fibrillation. We will continue his current doses of metoprolol and rivaroxaban (given DCCV within the last month). Long-term, low dose aspirin could be considered for stroke prophylaxis given CHADSVASC score of 1 (age > 35).  Chest pain No further episodes since his last visit in our office. Myocardial perfusion stress test was low risk without ischemia. No further workup at this time. I will defer continues primary prevention (including lipid monitoring) to Mr. Rupe's PCP.  Follow-up: Return to clinic in 6 months.  Nelva Bush, MD 04/08/2017 2:56 PM

## 2017-04-08 NOTE — Patient Instructions (Signed)
Medication Instructions:  Your physician recommends that you continue on your current medications as directed. Please refer to the Current Medication list given to you today.   Labwork: None  Testing/Procedures: None   Follow-Up: You have been referred to Dr Thompson Grayer for EP evaluation of atrial firbrillation  Your physician wants you to follow-up in: 6 months with Dr End. (May 2019).  You will receive a reminder letter in the mail two months in advance. If you don't receive a letter, please call our office to schedule the follow-up appointment.       If you need a refill on your cardiac medications before your next appointment, please call your pharmacy.

## 2017-04-09 ENCOUNTER — Encounter: Payer: Self-pay | Admitting: Internal Medicine

## 2017-04-10 ENCOUNTER — Telehealth: Payer: Self-pay

## 2017-04-10 ENCOUNTER — Encounter: Payer: Self-pay | Admitting: Internal Medicine

## 2017-04-10 NOTE — Telephone Encounter (Signed)
It is reasonable to investigate cost of rivaroxaban and dabigatran as alternatives to apixaban.  However, the patient's CHADSVASC score is 1.  Therefore, once he has completed 1 month of anticoagulation following his cardioversion (DCCV was on 03/25/17), it would also be acceptable to switch to aspirin 81 mg daily.  He can then readdress ongoing anticoagulation strategies with during his consultation with Dr. Rayann Heman next month.  Nelva Bush, MD [facility] Pager: [phone]

## 2017-04-10 NOTE — Telephone Encounter (Signed)
Patient states that with his [employer], his Xarelto 20mg  is $407.00 per 30 days.  States that he can not afford this and is unsure about Patient Assistance Program.  Would like to know if there are any alternatives to him taking Xarelto, and what would happen if he just stopped taking it?   We have set aside 2 weeks worth of Xarelto samples as requested per patient and states that he will come today to pick up.  Patient was supposed to check with his pharmacy and call us back after 30 minutes, it has been more than two hours now.  Please advise.

## 2017-04-10 NOTE — Telephone Encounter (Signed)
Pt states he found out there is a $400 deductible on his insurance plan  for a Tier 3 medication, Xarelto is a Tier 3. Pt is asking for alternatives, he does not want to consider warfarin but is going to check to see if Eliquis or Pradaxa would be a lower Tier and less expensive.  Pt is going to go ahead and pick up Xarelto samples this afternoon, will call me back the first of the week to let me know if Eliquis or Pradaxa would be a less expensive alternative to Xarelto.  I will forward to Dr End for review.

## 2017-04-15 NOTE — Telephone Encounter (Signed)
Pt states Eliquis, Pradaxa, and Xarelto are all Tier 3 medications so the cost would be the same for any one of them.   I discussed Dr Darnelle Bos recommendations with patient, he verbalized understanding. He will plan to pick up 10 more days of Xarelto, that will be enough to get him to his appointment with Dr Rayann Heman 05/06/17 when he will discuss ongoing anticoagulation.

## 2017-05-06 ENCOUNTER — Encounter: Payer: Self-pay | Admitting: Internal Medicine

## 2017-05-06 ENCOUNTER — Ambulatory Visit (INDEPENDENT_AMBULATORY_CARE_PROVIDER_SITE_OTHER): Payer: Medicare Other | Admitting: Internal Medicine

## 2017-05-06 VITALS — BP 92/66 | HR 139 | Ht 73.0 in | Wt 228.4 lb

## 2017-05-06 DIAGNOSIS — I481 Persistent atrial fibrillation: Secondary | ICD-10-CM

## 2017-05-06 DIAGNOSIS — I4819 Other persistent atrial fibrillation: Secondary | ICD-10-CM

## 2017-05-06 NOTE — Patient Instructions (Addendum)
Medication Instructions:  Your physician recommends that you continue on your current medications as directed. Please refer to the Current Medication list given to you today.   Labwork: None ordered   Testing/Procedures: None ordered   Follow-Up: Your physician wants you to follow-up in: 3rd week of January with [name], NP in the AFIB Clinic  Any Other Special Instructions Will Be Listed Below (If Applicable).     If you need a refill on your cardiac medications before your next appointment, please call your pharmacy.

## 2017-05-06 NOTE — Progress Notes (Signed)
Electrophysiology Office Note   Date:  05/06/2017   ID:  Hector Clark, DOB 04-07-1946, MRN [id number]  PCP:  Lavone Orn, MD  Cardiologist:  Dr End Primary Electrophysiologist: Thompson Grayer, MD    Chief Complaint  Patient presents with  . Atrial Fibrillation     History of Present Illness: Hector Clark is a 71 y.o. male who presents today for electrophysiology evaluation.   He is referred by Dr End for EP consultation regarding afib management options.  The patient was diagnosed with afib after presenting with RVR in July.  In retrospect, he thinks he has had symptoms of afib since 2009.  He has fatigue, palpitations, and atypical CP.  He also has SOB with limited activity when in afib.   He has recently undergone cardioversion which lasted only about 2days.  He did feel better with sinus rhythm. Unfortunately, he has not been compliant with xarelto due to costs.  Presently, he has decided to "avoid stress" and stop anticoagulation and thinks that with this approach he will do "just fine".  Today, he denies symptoms of chest pain,  orthopnea, PND, lower extremity edema, claudication, dizziness, presyncope, syncope, bleeding, or neurologic sequela. The patient is tolerating medications without difficulties and is otherwise without complaint today.    Past Medical History:  Diagnosis Date  . Chest pain    with nagative cardiolite  . Gout    occational flare  . Hyperlipemia   . Persistent atrial fibrillation ([place])   . Peyronie disease    Past Surgical History:  Procedure Laterality Date  . bone graph    . CARDIOVERSION N/A 03/25/2017   Procedure: CARDIOVERSION;  Surgeon: Sueanne Margarita, MD;  Location: [facility] ENDOSCOPY;  Service: Cardiovascular;  Laterality: N/A;  . 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 ECHINACEA PO Take by mouth as directed. As needed for cold symptoms    . ELDERBERRY PO  Take by mouth as directed. As needed for cold symptoms    . metoprolol succinate (TOPROL XL) 25 MG 24 hr tablet Take 1 tablet (25 mg total) by mouth every evening. 90 tablet 3  . NON FORMULARY Take by mouth as directed. Ivy Leaf as needed for cough     . rivaroxaban (XARELTO) 20 MG TABS tablet Take 1 tablet (20 mg total) by mouth daily with supper. 30 tablet 6  . vitamin C (ASCORBIC ACID) 500 MG tablet Take 500 mg by mouth 2 (two) times daily.    . vitamin E 400 UNIT capsule Take 400 Units by mouth daily.     No current facility-administered medications for this visit.     Allergies:   Patient has no known allergies.   Social History:  The patient  reports that  has never smoked. he has never used smokeless tobacco. He reports that he drinks alcohol. He reports that he does not use drugs.   Family History:  The patient's  family history includes Esophageal cancer (age of onset: 24) in his father; Healthy in his daughter; Liver cancer (age of onset: 79) in his mother.    ROS:  Please see the history of present illness.   All other systems are personally reviewed and negative.    PHYSICAL EXAM: VS:  BP 92/66   Pulse (!) 139   Ht 6\' 1"  (1.854 m)   Wt 228 lb 6.4 oz (103.6 kg)  SpO2 97%   BMI 30.13 kg/m  , BMI Body mass index is 30.13 kg/m. GEN: Well nourished, well developed, in no acute distress  HEENT: normal  Neck: no JVD, carotid bruits, or masses Cardiac: iRRR; no murmurs, rubs, or gallops,no edema  Respiratory:  clear to auscultation bilaterally, normal work of breathing GI: soft, nontender, nondistended, + BS MS: no deformity or atrophy  Skin: warm and dry  Neuro:  Strength and sensation are intact Psych: euthymic mood, full affect  EKG:  EKG is ordered today. The ekg ordered today is personally reviewed and shows afib, V rate 139 bpm, nonspecific ST/T changes   Recent Labs: 12/14/2016: ALT 23; Hemoglobin 16.7; Magnesium 2.1; Platelets 211; TSH 2.850 03/20/2017: BUN  13; Creatinine, Ser 1.01; Potassium 4.4; Sodium 142  personally reviewed   Lipid Panel  No results found for: CHOL, TRIG, HDL, CHOLHDL, VLDL, LDLCALC, LDLDIRECT personally reviewed   Wt Readings from Last 3 Encounters:  05/06/17 228 lb 6.4 oz (103.6 kg)  04/08/17 228 lb 3.2 oz (103.5 kg)  03/25/17 226 lb (102.5 kg)      Other studies personally reviewed: Additional studies/ records that were reviewed today include: Dr Darnelle Bos notes, recent echo  Review of the above records today demonstrates: as above, EF is preserved, no significant valvular disease, LA normal size   ASSESSMENT AND PLAN:  1.  Persistent afib The patient has symptomatic afib.  V rates are difficult to control due to low BP.  His chads2vasc score is 1.  He has not recently been compliant with xarelto due to cost concerns.   I had a long discussions today with the patient about his afib.  He wants to stop anticoagulation and not pursue sinus rhythm.  I have cautioned that I think that this is a very bad idea given our inability to control his V rates. Therapeutic strategies for afib including flecainide, tikosyn, sotalol, and amiodarone were discussed in detail with the patient today. Risk, benefits, and alternatives to each were discussed.  I have advised initiation of flecainide 100mg  BID after he has been appropriately anticoagulated for 3 weeks.  At this time, he says that he wishes to wait until after the 1st of the year to start xarelto as costs will be lower then. I have encouraged him to start xarelto the first of January and then follow-up in the AF clinic for initiation of flecainide once compliant with anticoagulation.  He will likely require cardioversion 3-5 days after initiation of flecainide. I would defer ablation for AAD therapy at this time as per guidelines.  Follow-up:  In the AF clinic in January as above  Current medicines are reviewed at length with the patient today.   The patient does not have  concerns regarding his medicines.  The following changes were made today:  none  Labs/ tests ordered today include:  Orders Placed This Encounter  Procedures  . EKG 12-Lead      Signed, Thompson Grayer, MD  05/06/2017 9:32 AM     [facility] [address] [place] [address] [phone] (office) [phone] (fax)

## 2017-05-10 DIAGNOSIS — I481 Persistent atrial fibrillation: Secondary | ICD-10-CM | POA: Diagnosis not present

## 2017-05-10 DIAGNOSIS — R739 Hyperglycemia, unspecified: Secondary | ICD-10-CM | POA: Diagnosis not present

## 2017-05-10 DIAGNOSIS — Z1389 Encounter for screening for other disorder: Secondary | ICD-10-CM | POA: Diagnosis not present

## 2017-05-10 DIAGNOSIS — Z136 Encounter for screening for cardiovascular disorders: Secondary | ICD-10-CM | POA: Diagnosis not present

## 2017-06-06 ENCOUNTER — Encounter (HOSPITAL_COMMUNITY): Payer: Self-pay | Admitting: Nurse Practitioner

## 2017-06-06 ENCOUNTER — Ambulatory Visit (HOSPITAL_COMMUNITY)
Admission: RE | Admit: 2017-06-06 | Discharge: 2017-06-06 | Disposition: A | Discharge status: Home / Self Care | Payer: Medicare Other | Source: Ambulatory Visit | Attending: Nurse Practitioner | Admitting: Nurse Practitioner

## 2017-06-06 VITALS — BP 94/62 | HR 119 | Ht 73.0 in | Wt 231.0 lb

## 2017-06-06 DIAGNOSIS — Z7901 Long term (current) use of anticoagulants: Secondary | ICD-10-CM | POA: Diagnosis not present

## 2017-06-06 DIAGNOSIS — I481 Persistent atrial fibrillation: Secondary | ICD-10-CM | POA: Diagnosis not present

## 2017-06-06 DIAGNOSIS — I4819 Other persistent atrial fibrillation: Secondary | ICD-10-CM

## 2017-06-06 MED ORDER — FLECAINIDE ACETATE 100 MG PO TABS: 100.0000 mg | ORAL_TABLET | Freq: Two times a day (BID) | ORAL | 1 refills | Status: DC

## 2017-06-06 NOTE — Patient Instructions (Signed)
Start Flecainide 100 mg, 1 tablet twice a day.    Come in to see Butch Penny Monday 06/10/17

## 2017-06-06 NOTE — Progress Notes (Signed)
Primary Care Physician: Lavone Orn, MD Referring Physician: Dr. Lerry Paterson Klecka is a 72 y.o. male with a h/o persistent afib that is in the afib clinic for f/u appointment with Dr. Rayann Heman 12/17 to discuss his options to restore SR. At that time it was decided that he would try flecainide but  he had been taking his DOAC very irregularly and would need to be on uninterrupted anticoagulation x 3 weeks to start flecainide. He has consistently taken since 12/26, which was 3 weeks yesterday.  He is ready to start flecainide and will start drug and see back early next week and set up for cardioversion.  Today, he denies symptoms of palpitations, chest pain, shortness of breath, orthopnea, PND, lower extremity edema, dizziness, presyncope, syncope, or neurologic sequela. The patient is tolerating medications without difficulties and is otherwise without complaint today.   Past Medical History:  Diagnosis Date  . Chest pain    with nagative cardiolite  . Gout    occational flare  . Hyperlipemia   . Persistent atrial fibrillation ([place])   . Peyronie disease    Past Surgical History:  Procedure Laterality Date  . bone graph    . CARDIOVERSION N/A 03/25/2017   Procedure: CARDIOVERSION;  Surgeon: Sueanne Margarita, MD;  Location: [facility] ENDOSCOPY;  Service: Cardiovascular;  Laterality: N/A;  . TOE SURGERY Left    4th toe  . tophus      Current Outpatient Medications  Medication Sig Dispense Refill  . ECHINACEA PO Take by mouth as directed. As needed for cold symptoms    . ELDERBERRY PO Take by mouth as directed. As needed for cold symptoms    . metoprolol succinate (TOPROL XL) 25 MG 24 hr tablet Take 1 tablet (25 mg total) by mouth every evening. (Patient taking differently: Take 25 mg by mouth daily. ) 90 tablet 3  . NON FORMULARY Take by mouth as directed. Ivy Leaf as needed for cough     . rivaroxaban (XARELTO) 20 MG TABS tablet Take 1 tablet (20 mg total) by mouth daily with  supper. 30 tablet 6  . vitamin C (ASCORBIC ACID) 500 MG tablet Take 500 mg by mouth 2 (two) times daily.    . vitamin E 400 UNIT capsule Take 400 Units by mouth daily.    . flecainide (TAMBOCOR) 100 MG tablet Take 1 tablet (100 mg total) by mouth 2 (two) times daily. 60 tablet 1   No current facility-administered medications for this encounter.     No Known Allergies  Social History   Socioeconomic History  . Marital status: Married    Spouse name: Not on file  . Number of children: Not on file  . Years of education: Not on file  . Highest education level: Not on file  Social Needs  . Financial resource strain: Not on file  . Food insecurity - worry: Not on file  . Food insecurity - inability: Not on file  . Transportation needs - medical: Not on file  . Transportation needs - non-medical: Not on file  Occupational History  . Not on file  Tobacco Use  . Smoking status: Never Smoker  . Smokeless tobacco: Never Used  Substance and Sexual Activity  . Alcohol use: Yes    Comment: 1 beer per month  . Drug use: No  . Sexual activity: Not on file  Other Topics Concern  . 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 Liver cancer Mother 37  . Esophageal cancer Father 79  . Healthy Daughter   . Heart disease Neg Hx     ROS- All systems are reviewed and negative except as per the HPI above  Physical Exam: Vitals:   06/06/17 0836  BP: 94/62  Pulse: (!) 119  Weight: 231 lb (104.8 kg)  Height: 6\' 1"  (1.854 m)   Wt Readings from Last 3 Encounters:  06/06/17 231 lb (104.8 kg)  05/06/17 228 lb 6.4 oz (103.6 kg)  04/08/17 228 lb 3.2 oz (103.5 kg)    Labs: Lab Results  Component Value Date   NA 142 03/20/2017   K 4.4 03/20/2017   CL 104 03/20/2017   CO2 23 03/20/2017   GLUCOSE 125 (H) 03/20/2017   BUN 13 03/20/2017   CREATININE 1.01 03/20/2017   CALCIUM  9.5 03/20/2017   MG 2.1 12/14/2016   No results found for: INR No results found for: CHOL, HDL, LDLCALC, TRIG   GEN- The patient is well appearing, alert and oriented x 3 today.   Head- normocephalic, atraumatic Eyes-  Sclera clear, conjunctiva pink Ears- hearing intact Oropharynx- clear Neck- supple, no JVP Lymph- no cervical lymphadenopathy Lungs- Clear to ausculation bilaterally, normal work of breathing Heart- irregular rate and rhythm, no murmurs, rubs or gallops, PMI not laterally displaced GI- soft, NT, ND, + BS Extremities- no clubbing, cyanosis, or edema MS- no significant deformity or atrophy Skin- no rash or lesion Psych- euthymic mood, full affect Neuro- strength and sensation are intact  EKG-afib at 119 bpm, qrs int 84 ms, qtc 438 ms Epic records reviewed Echo-------------------------------------------------------------------- Study Conclusions  - Left ventricle: The cavity size was normal. Wall thickness was   normal. Systolic function was normal. The estimated ejection   fraction was in the range of 50% to 55%. The study is not   technically sufficient to allow evaluation of LV diastolic   function. - Mitral valve: There was mild regurgitation. - Atrial septum: No defect or patent foramen ovale was identified. Imaging-Please let Mr. Aust know that his stress test is low risk without evidence of a significant blockage. He should continue his current medications and follow-up with [name] as planned later this month.   Assessment and Plan: 1. Persistent afib Difficult to control v rates 2/2 low BP Now on uninterrupted anticoagulation x 3 weeks Continue xarelto 20 mg daily for Chadsvasc score of 1(age) Continue metoprolol 25 mg daily   Will start flecainide 100 mg bid per Dr. Jackalyn Lombard note F/u on Monday on flecainide 100 mg bid and I will then get set up for cardioversion He will need a ETT on flecainide when in SR as he wishes to return to a  running program, he ran marathons in the past  Butch Penny C. Carroll, [facility] [address] [place], [place] [address] [phone]

## 2017-06-10 ENCOUNTER — Ambulatory Visit (HOSPITAL_COMMUNITY): Payer: Medicare Other | Admitting: Nurse Practitioner

## 2017-06-10 ENCOUNTER — Ambulatory Visit (HOSPITAL_COMMUNITY)
Admission: RE | Admit: 2017-06-10 | Discharge: 2017-06-10 | Disposition: A | Discharge status: Home / Self Care | Payer: Medicare Other | Source: Ambulatory Visit | Attending: Nurse Practitioner | Admitting: Nurse Practitioner

## 2017-06-10 ENCOUNTER — Encounter (HOSPITAL_COMMUNITY): Payer: Self-pay | Admitting: Nurse Practitioner

## 2017-06-10 VITALS — BP 106/62 | HR 67 | Ht 73.0 in | Wt 235.6 lb

## 2017-06-10 DIAGNOSIS — Z79899 Other long term (current) drug therapy: Secondary | ICD-10-CM | POA: Diagnosis not present

## 2017-06-10 DIAGNOSIS — I481 Persistent atrial fibrillation: Secondary | ICD-10-CM | POA: Diagnosis not present

## 2017-06-10 DIAGNOSIS — I493 Ventricular premature depolarization: Secondary | ICD-10-CM | POA: Diagnosis not present

## 2017-06-10 DIAGNOSIS — M109 Gout, unspecified: Secondary | ICD-10-CM | POA: Insufficient documentation

## 2017-06-10 DIAGNOSIS — Z7901 Long term (current) use of anticoagulants: Secondary | ICD-10-CM | POA: Diagnosis not present

## 2017-06-10 DIAGNOSIS — I4819 Other persistent atrial fibrillation: Secondary | ICD-10-CM

## 2017-06-10 DIAGNOSIS — E785 Hyperlipidemia, unspecified: Secondary | ICD-10-CM | POA: Diagnosis not present

## 2017-06-10 NOTE — Progress Notes (Signed)
Primary Care Physician: Hector Orn, MD Referring Physician: Dr. Lerry Paterson Clark is a 72 y.o. male with a h/o persistent afib that is in the afib clinic for f/u appointment with Dr. Rayann Clark 12/17 to discuss his options to restore SR. At that time it was decided that he would try flecainide but  he had been taking his DOAC very irregularly and would need to be on uninterrupted anticoagulation x 3 weeks to start flecainide. He has consistently taken since 12/26, which was 3 weeks yesterday.  He is ready to start flecainide and will start drug and see back early next week and set up for cardioversion.  F/u afib clinic 1/21.He started flecainide 100 mg bid and is in the clinic for f/u. He is in SR. He started feeling better Sunday Pm.   Today, he denies symptoms of palpitations, chest pain, shortness of breath, orthopnea, PND, lower extremity edema, dizziness, presyncope, syncope, or neurologic sequela. The patient is tolerating medications without difficulties and is otherwise without complaint today.   Past Medical History:  Diagnosis Date  . Chest pain    with nagative cardiolite  . Gout    occational flare  . Hyperlipemia   . Persistent atrial fibrillation ([place])   . Peyronie disease    Past Surgical History:  Procedure Laterality Date  . bone graph    . CARDIOVERSION N/A 03/25/2017   Procedure: CARDIOVERSION;  Surgeon: Hector Margarita, MD;  Location: [facility] ENDOSCOPY;  Service: Cardiovascular;  Laterality: N/A;  . TOE SURGERY Left    4th toe  . tophus      Current Outpatient Medications  Medication Sig Dispense Refill  . ECHINACEA PO Take by mouth as directed. As needed for cold symptoms    . ELDERBERRY PO Take by mouth as directed. As needed for cold symptoms    . flecainide (TAMBOCOR) 100 MG tablet Take 1 tablet (100 mg total) by mouth 2 (two) times daily. 60 tablet 1  . metoprolol succinate (TOPROL XL) 25 MG 24 hr tablet Take 1 tablet (25 mg total) by mouth every  evening. (Patient taking differently: Take 25 mg by mouth daily. ) 90 tablet 3  . NON FORMULARY Take by mouth as directed. Ivy Leaf as needed for cough     . rivaroxaban (XARELTO) 20 MG TABS tablet Take 1 tablet (20 mg total) by mouth daily with supper. 30 tablet 6  . vitamin C (ASCORBIC ACID) 500 MG tablet Take 500 mg by mouth 2 (two) times daily.    . vitamin E 400 UNIT capsule Take 400 Units by mouth daily.     No current facility-administered medications for this encounter.     No Known Allergies  Social History   Socioeconomic History  . Marital status: Married    Spouse name: Not on file  . Number of children: Not on file  . Years of education: Not on file  . Highest education level: Not on file  Social Needs  . Financial resource strain: Not on file  . Food insecurity - worry: Not on file  . Food insecurity - inability: Not on file  . Transportation needs - medical: Not on file  . Transportation needs - non-medical: Not on file  Occupational History  . Not on file  Tobacco Use  . Smoking status: Never Smoker  . Smokeless tobacco: Never Used  Substance and Sexual Activity  . Alcohol use: Yes    Comment: 1 beer per month  . Drug  use: No  . Sexual activity: Not on file  Other Topics Concern  . 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 Liver cancer Mother 60  . Esophageal cancer Father 55  . Healthy Daughter   . Heart disease Neg Hx     ROS- All systems are reviewed and negative except as per the HPI above  Physical Exam: Vitals:   06/10/17 1456  BP: 106/62  Pulse: 67  Weight: 235 lb 9.6 oz (106.9 kg)  Height: 6\' 1"  (1.854 m)   Wt Readings from Last 3 Encounters:  06/10/17 235 lb 9.6 oz (106.9 kg)  06/06/17 231 lb (104.8 kg)  05/06/17 228 lb 6.4 oz (103.6 kg)    Labs: Lab Results  Component Value Date   NA 142 03/20/2017   K 4.4  03/20/2017   CL 104 03/20/2017   CO2 23 03/20/2017   GLUCOSE 125 (H) 03/20/2017   BUN 13 03/20/2017   CREATININE 1.01 03/20/2017   CALCIUM 9.5 03/20/2017   MG 2.1 12/14/2016   No results found for: INR No results found for: CHOL, HDL, LDLCALC, TRIG   GEN- The patient is well appearing, alert and oriented x 3 today.   Head- normocephalic, atraumatic Eyes-  Sclera clear, conjunctiva pink Ears- hearing intact Oropharynx- clear Neck- supple, no JVP Lymph- no cervical lymphadenopathy Lungs- Clear to ausculation bilaterally, normal work of breathing Heart- regular rate and rhythm, no murmurs, rubs or gallops, PMI not laterally displaced GI- soft, NT, ND, + BS Extremities- no clubbing, cyanosis, or edema MS- no significant deformity or atrophy Skin- no rash or lesion Psych- euthymic mood, full affect Neuro- strength and sensation are intact  EKG-SR at 67 bpm with first degree AV block, pr int 226 ms, qrs int 102 ms, qtc 441 ms Epic records reviewed Echo-------------------------------------------------------------------- Study Conclusions  - Left ventricle: The cavity size was normal. Wall thickness was   normal. Systolic function was normal. The estimated ejection   fraction was in the range of 50% to 55%. The study is not   technically sufficient to allow evaluation of LV diastolic   function. - Mitral valve: There was mild regurgitation. - Atrial septum: No defect or patent foramen ovale was identified.  Imaging-Please let Hector Clark know that his stress test is low risk without evidence of a significant blockage. He should continue his current medications and follow-up with Hector Clark as planned later this month.   Assessment and Plan: 1. Persistent afib Difficult to control v rates 2/2 low BP Now on uninterrupted anticoagulation x 3 weeks Continue xarelto 20 mg daily for Chadsvasc score of 1(age) Continue metoprolol 25 mg daily   Now on  flecainide 100 mg bid  per Dr. Jackalyn Clark note and he converted to SR I will see back later in the week to make sure SR is holding then, he will need a ETT on flecainide when in SR, as he wishes to return to a running program, he ran marathons in the past  Hector Clark Penny C. Harrol Novello, [facility] [address] [place], [place] [address] [phone]

## 2017-06-13 ENCOUNTER — Ambulatory Visit (HOSPITAL_COMMUNITY)
Admission: RE | Admit: 2017-06-13 | Discharge: 2017-06-13 | Disposition: A | Discharge status: Home / Self Care | Payer: Medicare Other | Source: Ambulatory Visit | Attending: Nurse Practitioner | Admitting: Nurse Practitioner

## 2017-06-13 ENCOUNTER — Telehealth: Payer: Self-pay | Admitting: Pharmacist

## 2017-06-13 ENCOUNTER — Encounter (HOSPITAL_COMMUNITY): Payer: Self-pay | Admitting: Nurse Practitioner

## 2017-06-13 ENCOUNTER — Encounter (HOSPITAL_COMMUNITY): Payer: Medicare Other | Admitting: Nurse Practitioner

## 2017-06-13 DIAGNOSIS — I440 Atrioventricular block, first degree: Secondary | ICD-10-CM | POA: Diagnosis not present

## 2017-06-13 DIAGNOSIS — I4891 Unspecified atrial fibrillation: Secondary | ICD-10-CM | POA: Insufficient documentation

## 2017-06-13 DIAGNOSIS — I447 Left bundle-branch block, unspecified: Secondary | ICD-10-CM | POA: Diagnosis not present

## 2017-06-13 NOTE — Telephone Encounter (Signed)
Medication list reviewed in anticipation of upcoming Tikosyn initiation. Patient is not taking any contraindicated or QTc prolonging medications. He does take multiple herbal supplements on a prn basis and data is limited on these, however I cannot find any notable interactions.  Patient is anticoagulated on Xarelto 20mg  daily on the appropriate dose. Please ensure that patient has not missed any anticoagulation doses in the 3 weeks prior to Tikosyn initiation.   Patient will need to be counseled to avoid use of Benadryl while on Tikosyn and in the 2-3 days prior to Tikosyn initiation.

## 2017-06-13 NOTE — Progress Notes (Addendum)
Pt in for EKG, to be reviewed by [name], NP  Pt has now been on flecainide 100 mg bid now for less than one week. Even though pt is feeling better and flecainide has gotten pt back in SR, I do not like the interval widening on flecainide, now with first degree block and LBBB. Reviewed with Dr. Rayann Heman and he was in agreement to stop drug. Discussed with pt other options and he will look into the price of tikosyn vrs sotalol. Will tentatively set up to come into the hospital on Thursday to be admitted with Dr. Rayann Heman.

## 2017-06-13 NOTE — Patient Instructions (Signed)
Stop Flecainide.

## 2017-06-14 ENCOUNTER — Ambulatory Visit (HOSPITAL_COMMUNITY): Payer: Medicare Other | Admitting: Nurse Practitioner

## 2017-06-18 ENCOUNTER — Other Ambulatory Visit: Payer: Self-pay

## 2017-06-18 ENCOUNTER — Ambulatory Visit (HOSPITAL_COMMUNITY)
Admission: RE | Admit: 2017-06-18 | Discharge: 2017-06-18 | Disposition: A | Discharge status: Home / Self Care | Payer: Medicare Other | Source: Ambulatory Visit | Attending: Nurse Practitioner | Admitting: Nurse Practitioner

## 2017-06-18 ENCOUNTER — Inpatient Hospital Stay (HOSPITAL_COMMUNITY)
Admission: AD | Admit: 2017-06-18 | Discharge: 2017-06-21 | DRG: 310 | Disposition: A | Discharge status: Home / Self Care | Payer: Medicare Other | Source: Ambulatory Visit | Attending: Internal Medicine | Admitting: Internal Medicine

## 2017-06-18 ENCOUNTER — Encounter (HOSPITAL_COMMUNITY): Payer: Self-pay | Admitting: General Practice

## 2017-06-18 ENCOUNTER — Encounter (HOSPITAL_COMMUNITY): Payer: Self-pay | Admitting: Nurse Practitioner

## 2017-06-18 VITALS — BP 102/60 | HR 98 | Ht 73.0 in | Wt 231.4 lb

## 2017-06-18 DIAGNOSIS — Z5181 Encounter for therapeutic drug level monitoring: Secondary | ICD-10-CM

## 2017-06-18 DIAGNOSIS — E785 Hyperlipidemia, unspecified: Secondary | ICD-10-CM | POA: Diagnosis present

## 2017-06-18 DIAGNOSIS — Z7901 Long term (current) use of anticoagulants: Secondary | ICD-10-CM | POA: Diagnosis not present

## 2017-06-18 DIAGNOSIS — I481 Persistent atrial fibrillation: Secondary | ICD-10-CM | POA: Diagnosis not present

## 2017-06-18 DIAGNOSIS — I4819 Other persistent atrial fibrillation: Secondary | ICD-10-CM | POA: Diagnosis present

## 2017-06-18 DIAGNOSIS — M109 Gout, unspecified: Secondary | ICD-10-CM | POA: Diagnosis not present

## 2017-06-18 DIAGNOSIS — Z79899 Other long term (current) drug therapy: Secondary | ICD-10-CM

## 2017-06-18 HISTORY — DX: Encounter for therapeutic drug level monitoring: Z51.81

## 2017-06-18 HISTORY — DX: Other long term (current) drug therapy: Z79.899

## 2017-06-18 LAB — BASIC METABOLIC PANEL
ANION GAP: 9 (ref 5–15)
BUN: 8 mg/dL (ref 6–20)
CALCIUM: 9.3 mg/dL (ref 8.9–10.3)
CO2: 23 mmol/L (ref 22–32)
Chloride: 106 mmol/L (ref 101–111)
Creatinine, Ser: 0.93 mg/dL (ref 0.61–1.24)
GFR calc Af Amer: >60 mL/min (ref >60)
Glucose, Bld: 133 mg/dL — ABNORMAL HIGH (ref 65–99)
POTASSIUM: 4.3 mmol/L (ref 3.5–5.1)
Sodium: 138 mmol/L (ref 135–145)

## 2017-06-18 LAB — MAGNESIUM: MAGNESIUM: 1.8 mg/dL (ref 1.7–2.4)

## 2017-06-18 MED ORDER — SODIUM CHLORIDE 0.9 % IV SOLN: 250.0000 mL | INTRAVENOUS | Status: DC | PRN

## 2017-06-18 MED ORDER — DOFETILIDE 500 MCG PO CAPS
500.0000 ug | ORAL_CAPSULE | Freq: Two times a day (BID) | ORAL | Status: DC
  Administered 2017-06-18 – 2017-06-21 (×7): 500 ug via ORAL
  Filled 2017-06-18 (×7): qty 1

## 2017-06-18 MED ORDER — SODIUM CHLORIDE 0.9% FLUSH
3.0000 mL | Freq: Two times a day (BID) | INTRAVENOUS | Status: DC
  Administered 2017-06-18 – 2017-06-21 (×5): 3 mL via INTRAVENOUS

## 2017-06-18 MED ORDER — SODIUM CHLORIDE 0.9% FLUSH: 3.0000 mL | INTRAVENOUS | Status: DC | PRN

## 2017-06-18 MED ORDER — MAGNESIUM SULFATE 2 GM/50ML IV SOLN
2.0000 g | Freq: Once | INTRAVENOUS | Status: AC
  Administered 2017-06-18: 2 g via INTRAVENOUS
  Filled 2017-06-18: qty 50

## 2017-06-18 MED ORDER — RIVAROXABAN 20 MG PO TABS
20.0000 mg | ORAL_TABLET | Freq: Every day | ORAL | Status: DC
  Administered 2017-06-19 – 2017-06-21 (×3): 20 mg via ORAL
  Filled 2017-06-18 (×3): qty 1

## 2017-06-18 MED ORDER — METOPROLOL SUCCINATE ER 25 MG PO TB24
25.0000 mg | ORAL_TABLET | Freq: Every day | ORAL | Status: DC
  Administered 2017-06-19 – 2017-06-21 (×3): 25 mg via ORAL
  Filled 2017-06-18 (×3): qty 1

## 2017-06-18 NOTE — Progress Notes (Signed)
Patient had several runs of VT ranging 3-10 beats.  Chanetta Marshall, NP notified, stated to continue monitoring.

## 2017-06-18 NOTE — H&P (Signed)
Primary Care Physician: Lavone Orn, MD Referring Physician: Dr. Lerry Paterson Clear is a 72 y.o. male with a h/o persistent afib that is in the afib clinic for f/u appointment with Dr. Rayann Heman 05/06/17 to discuss his options to restore SR. At that time it was decided that he would try flecainide but  he had been taking his DOAC very irregularly and would need to be on uninterrupted anticoagulation x 3 weeks to start flecainide. He has consistently taken since 12/26, which was 3 weeks yesterday.  He  started flecainide and the drug returned him to SR, without cardioversion, but it had to be stopped 2/2 interval lengthening. He stopped flecainide last Thursday PM and was back into afib by Saturday pm. He had chest pain last Sunday associated with return of afib. He had a stress test last August that was low risk. He is not having chest pain today. He is rate controlled today. Aware of price of drug and wants to proceed to hospitalization for tikosyn loading.  Today, he denies symptoms of palpitations, chest pain, shortness of breath, orthopnea, PND, lower extremity edema, dizziness, presyncope, syncope, or neurologic sequela. The patient is tolerating medications without difficulties and is otherwise without complaint today.       Past Medical History:  Diagnosis Date  . Chest pain    with nagative cardiolite  . Gout    occational flare  . Hyperlipemia   . Persistent atrial fibrillation ([place])   . Peyronie disease         Past Surgical History:  Procedure Laterality Date  . bone graph    . CARDIOVERSION N/A 03/25/2017   Procedure: CARDIOVERSION;  Surgeon: Sueanne Margarita, MD;  [facility]: [facility];  Service: Cardiovascular;  Laterality: N/A;  . TOE SURGERY Left    4th toe  . tophus            Current Outpatient Medications  Medication Sig Dispense Refill  . metoprolol succinate (TOPROL XL) 25 MG 24 hr tablet Take 1 tablet (25 mg total) by mouth every  evening. (Patient taking differently: Take 25 mg by mouth daily. ) 90 tablet 3  . rivaroxaban (XARELTO) 20 MG TABS tablet Take 1 tablet (20 mg total) by mouth daily with supper. 30 tablet 6  . ECHINACEA PO Take by mouth as directed. As needed for cold symptoms    . ELDERBERRY PO Take by mouth as directed. As needed for cold symptoms    . NON FORMULARY Take by mouth as directed. Ivy Leaf as needed for cough     . vitamin C (ASCORBIC ACID) 500 MG tablet Take 500 mg by mouth 2 (two) times daily.    . vitamin E 400 UNIT capsule Take 400 Units by mouth daily.     No current facility-administered medications for this encounter.     No Known Allergies  Social History        Socioeconomic History  . Marital status: Married    Spouse name: Not on file  . Number of children: Not on file  . Years of education: Not on file  . Highest education level: Not on file  Social Needs  . Financial resource strain: Not on file  . Food insecurity - worry: Not on file  . Food insecurity - inability: Not on file  . Transportation needs - medical: Not on file  . Transportation needs - non-medical: Not on file  Occupational History  . Not on file  Tobacco Use  .  Smoking status: Never Smoker  . Smokeless tobacco: Never Used  Substance and Sexual Activity  . Alcohol use: Yes    Comment: 1 beer per month  . Drug use: No  . Sexual activity: Not on file  Other Topics Concern  . 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 Liver cancer Mother 6  . Esophageal cancer Father 55  . Healthy Daughter   . Heart disease Neg Hx     ROS- All systems are reviewed and negative except as per the HPI above  Physical Exam: Vitals:   06/18/17 0953  BP: 102/60  Pulse: 98  Weight: 231 lb 6.4 oz (105 kg)  Height: 6\' 1"  (1.854 m)      Wt Readings from Last 3  Encounters:  06/18/17 231 lb 6.4 oz (105 kg)  06/10/17 235 lb 9.6 oz (106.9 kg)  06/06/17 231 lb (104.8 kg)    Labs: RecentLabs       Lab Results  Component Value Date   NA 138 06/18/2017   K 4.3 06/18/2017   CL 106 06/18/2017   CO2 23 06/18/2017   GLUCOSE 133 (H) 06/18/2017   BUN 8 06/18/2017   CREATININE 0.93 06/18/2017   CALCIUM 9.3 06/18/2017   MG 1.8 06/18/2017     RecentLabs  No results found for: INR   RecentLabs  No results found for: CHOL, HDL, LDLCALC, TRIG     GEN- The patient is well appearing, alert and oriented x 3 today.   Head- normocephalic, atraumatic Eyes-  Sclera clear, conjunctiva pink Ears- hearing intact Oropharynx- clear Neck- supple, no JVP Lymph- no cervical lymphadenopathy Lungs- Clear to ausculation bilaterally, normal work of breathing Heart- irregular rate and rhythm, no murmurs, rubs or gallops, PMI not laterally displaced GI- soft, NT, ND, + BS Extremities- no clubbing, cyanosis, or edema MS- no significant deformity or atrophy Skin- no rash or lesion Psych- euthymic mood, full affect Neuro- strength and sensation are intact  EKG-afib at 98 bpm, qrs int 82 ms, qtc 400 ms Epic records reviewed Echo-------------------------------------------------------------------- Study Conclusions  - Left ventricle: The cavity size was normal. Wall thickness was normal. Systolic function was normal. The estimated ejection fraction was in the range of 50% to 55%. The study is not technically sufficient to allow evaluation of LV diastolic function. - Mitral valve: There was mild regurgitation. - Atrial septum: No defect or patent foramen ovale was identified.  Imaging- 12/2016-   Study Highlights     There was no ST segment deviation noted during stress.  Defect 1: There is a small defect of severe severity present in the apex location.  This is a low risk study.  Low risk stress nuclear study with  apical thinning vs small prior infarct; no significant ischemia; study not gated due to atrial fibrillation.     Assessment and Plan: 1. Persistent afib Difficult to control v rates 2/2 low BP Flecainide itself restored SR but drug stopped  2/2 lengthening of pr/qrs intervals  Off flecainide since last Thursday pm No benadryl or other supplements  Continue xarelto 20 mg daily for Chadsvasc score of 1(age), no missed doses x 3 weeks Continue metoprolol 25 mg daily   Aware of cost of drug Bmet/mag today with k+ at 4.3 and mag at 1.8, acceptable for admission. crcl  cal at 111.10  Pt to admitted to 6E, Chanetta Marshall, NP aware     Geroge Baseman. [place], [facility] [address] [place], [place] [address] [phone]             I have seen, examined the patient, and reviewed the above assessment and plan.  Changes to above are made where necessary.  On exam, iRRR.  I have reviewed ekg and qt is stable for tikosyn.  Reports compliance with anticoagulation without interruption.  Co Sign: Thompson Grayer, MD 06/18/2017 3:45 PM

## 2017-06-18 NOTE — Progress Notes (Signed)
Primary Care Physician: Lavone Orn, MD Referring Physician: Dr. Lerry Paterson Hector Clark is a 72 y.o. male with a h/o persistent afib that is in the afib clinic for f/u appointment with Dr. Rayann Heman 05/06/17 to discuss his options to restore SR. At that time it was decided that he would try flecainide but  he had been taking his DOAC very irregularly and would need to be on uninterrupted anticoagulation x 3 weeks to start flecainide. He has consistently taken since 12/26, which was 3 weeks yesterday.  He  started flecainide and the drug returned him to SR, without cardioversion, but it had to be stopped 2/2 interval lengthening. He stopped flecainide last Thursday PM and was back into afib by Saturday pm. He had chest pain last Sunday associated with return of afib. He had a stress test last August that was low risk. He is not having chest pain today. He is rate controlled today. Aware of price of drug and wants to proceed to hospitalization for tikosyn loading.  Today, he denies symptoms of palpitations, chest pain, shortness of breath, orthopnea, PND, lower extremity edema, dizziness, presyncope, syncope, or neurologic sequela. The patient is tolerating medications without difficulties and is otherwise without complaint today.   Past Medical History:  Diagnosis Date  . Chest pain    with nagative cardiolite  . Gout    occational flare  . Hyperlipemia   . Persistent atrial fibrillation ([place])   . Peyronie disease    Past Surgical History:  Procedure Laterality Date  . bone graph    . CARDIOVERSION N/A 03/25/2017   Procedure: CARDIOVERSION;  Surgeon: Sueanne Margarita, MD;  [facility];  Service: Cardiovascular;  Laterality: N/A;  . TOE SURGERY Left    4th toe  . tophus      Current Outpatient Medications  Medication Sig Dispense Refill  . metoprolol succinate (TOPROL XL) 25 MG 24 hr tablet Take 1 tablet (25 mg total) by mouth every evening. (Patient taking differently:  Take 25 mg by mouth daily. ) 90 tablet 3  . rivaroxaban (XARELTO) 20 MG TABS tablet Take 1 tablet (20 mg total) by mouth daily with supper. 30 tablet 6  . ECHINACEA PO Take by mouth as directed. As needed for cold symptoms    . ELDERBERRY PO Take by mouth as directed. As needed for cold symptoms    . NON FORMULARY Take by mouth as directed. Ivy Leaf as needed for cough     . vitamin C (ASCORBIC ACID) 500 MG tablet Take 500 mg by mouth 2 (two) times daily.    . vitamin E 400 UNIT capsule Take 400 Units by mouth daily.     No current facility-administered medications for this encounter.     No Known Allergies  Social History   Socioeconomic History  . Marital status: Married    Spouse name: Not on file  . Number of children: Not on file  . Years of education: Not on file  . Highest education level: Not on file  Social Needs  . Financial resource strain: Not on file  . Food insecurity - worry: Not on file  . Food insecurity - inability: Not on file  . Transportation needs - medical: Not on file  . Transportation needs - non-medical: Not on file  Occupational History  . Not on file  Tobacco Use  . Smoking status: Never Smoker  . Smokeless tobacco: Never Used  Substance and Sexual Activity  . Alcohol  use: Yes    Comment: 1 beer per month  . Drug use: No  . Sexual activity: Not on file  Other Topics Concern  . 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 Liver cancer Mother 25  . Esophageal cancer Father 30  . Healthy Daughter   . Heart disease Neg Hx     ROS- All systems are reviewed and negative except as per the HPI above  Physical Exam: Vitals:   06/18/17 0953  BP: 102/60  Pulse: 98  Weight: 231 lb 6.4 oz (105 kg)  Height: 6\' 1"  (1.854 m)   Wt Readings from Last 3 Encounters:  06/18/17 231 lb 6.4 oz (105 kg)  06/10/17 235 lb 9.6 oz (106.9 kg)    06/06/17 231 lb (104.8 kg)    Labs: Lab Results  Component Value Date   NA 138 06/18/2017   K 4.3 06/18/2017   CL 106 06/18/2017   CO2 23 06/18/2017   GLUCOSE 133 (H) 06/18/2017   BUN 8 06/18/2017   CREATININE 0.93 06/18/2017   CALCIUM 9.3 06/18/2017   MG 1.8 06/18/2017   No results found for: INR No results found for: CHOL, HDL, LDLCALC, TRIG   GEN- The patient is well appearing, alert and oriented x 3 today.   Head- normocephalic, atraumatic Eyes-  Sclera clear, conjunctiva pink Ears- hearing intact Oropharynx- clear Neck- supple, no JVP Lymph- no cervical lymphadenopathy Lungs- Clear to ausculation bilaterally, normal work of breathing Heart- irregular rate and rhythm, no murmurs, rubs or gallops, PMI not laterally displaced GI- soft, NT, ND, + BS Extremities- no clubbing, cyanosis, or edema MS- no significant deformity or atrophy Skin- no rash or lesion Psych- euthymic mood, full affect Neuro- strength and sensation are intact  EKG-afib at 98 bpm, qrs int 82 ms, qtc 400 ms Epic records reviewed Echo-------------------------------------------------------------------- Study Conclusions  - Left ventricle: The cavity size was normal. Wall thickness was   normal. Systolic function was normal. The estimated ejection   fraction was in the range of 50% to 55%. The study is not   technically sufficient to allow evaluation of LV diastolic   function. - Mitral valve: There was mild regurgitation. - Atrial septum: No defect or patent foramen ovale was identified.  Imaging- 12/2016-   Study Highlights     There was no ST segment deviation noted during stress.  Defect 1: There is a small defect of severe severity present in the apex location.  This is a low risk study.   Low risk stress nuclear study with apical thinning vs small prior infarct; no significant ischemia; study not gated due to atrial fibrillation.     Assessment and Plan: 1. Persistent  afib Difficult to control v rates 2/2 low BP Flecainide itself restored SR but drug stopped  2/2 lengthening of pr/qrs intervals  Off flecainide since last Thursday pm No benadryl or other supplements  Continue xarelto 20 mg daily for Chadsvasc score of 1(age), no missed doses x 3 weeks Continue metoprolol 25 mg daily   Aware of cost of drug Bmet/mag today with k+ at 4.3 and mag at 1.8, acceptable for admission. crcl cal at 111.10  Pt to admitted to 6E, Chanetta Marshall, NP aware     Hector Clark. [facility] [address] [place], [place] [address]  [phone]   

## 2017-06-18 NOTE — Progress Notes (Signed)
Patient converted to NSR, ekg obtained to confirm.  Chanetta Marshall, NP notified.

## 2017-06-18 NOTE — Care Management Note (Addendum)
Case Management Note  Patient Details  Name: Hector Clark MRN: [id number] Date of Birth: [birth year]  Subjective/Objective:  Pt presented for Tikosyn Load: Benefits Check in process and will make pt aware of cost once completed. PTA independent from home.                   Action/Plan: Pt will need a Rx for 7 day supply without refills and then the original Rx with refills. CM will continue to monitor for additional needs.   Expected Discharge Date:                  Expected Discharge Plan:  Home/Self Care  In-House Referral:  NA  Discharge planning Services  CM Consult  Post Acute Care Choice:  NA Choice offered to:  NA  DME Arranged:  N/A DME Agency:  NA  HH Arranged:  NA HH Agency:  NA  Status of Service:  Completed, signed off  If discussed at [place] of Stay Meetings, dates discussed:    Additional Comments: 1238 06-19-17 Jacqlyn Krauss, RN,BSN [phone] CM did speak with pt and he uses [facility]- Medication can be ordered. No further needs from CM at this time.    1018 06-19-17 Jacqlyn Krauss, RN,BSN [phone]   [facility] @ [place] [facility] # [phone]    1. TIKOSYN CAPSULE 125 MCG, 250 MCG, 500 MCG BID  COVER- NOT [place] # [phone]   2. DOFETILIDE CAPSULE 125 MCG BID  COVER- YES  CO-PAY- $ 115.89  TIER- 4 DRUG  PRIOR APPROVAL- NO   3. DOFETILIDE CAPSULE 250 MCG BID  COVER- YES  CO-PAY- $ 109.39  TIER- 4 DRUG  PRIOR APPROVAL- NO   4. DOFETILIDE CAPSULE 500 MCG BD  COVER- YES  CO-PAY- $ 101.40  TIER- 4 DRUG  PRIOR APPROVAL- NO    PREFERRED PHARMACY : Mammie Russian, RN 06/18/2017, 1:49 PM

## 2017-06-18 NOTE — Progress Notes (Signed)
Pharmacy Review for Dofetilide (Tikosyn) Initiation  Admit Complaint: 72 y.o. male admitted 06/18/2017 with atrial fibrillation to be initiated on dofetilide.   Assessment:  Patient Exclusion Criteria: If any screening criteria checked as "Yes", then  patient  should NOT receive dofetilide until criteria item is corrected. If "Yes" please indicate correction plan.  YES  NO Patient  Exclusion Criteria Correction Plan  []  [x]  Baseline QTc interval is greater than or equal to 440 msec. IF above YES box checked dofetilide contraindicated unless patient has ICD; then may proceed if QTc 500-550 msec or with known ventricular conduction abnormalities may proceed with QTc 550-600 msec. QTc =  400   []  [x]  Magnesium level is less than 1.8 mEq/l : Last magnesium:  Lab Results  Component Value Date   MG 1.8 06/18/2017       Mag 2 gm IV x 1 to be given  []  [x]  Potassium level is less than 4 mEq/l : Last potassium:  Lab Results  Component Value Date   K 4.3 06/18/2017         []  [x]  Patient is known or suspected to have a digoxin level greater than 2 ng/ml: No results found for: DIGOXIN    []  [x]  Creatinine clearance less than 20 ml/min (calculated using Cockcroft-Gault, actual body weight and serum creatinine): Estimated Creatinine Clearance: 95.3 mL/min (by C-G formula based on SCr of 0.93 mg/dL).    []  [x]  Patient has received drugs known to prolong the QT intervals within the last 48 hours (phenothiazines, tricyclics or tetracyclic antidepressants, erythromycin, H-1 antihistamines, cisapride, fluoroquinolones, azithromycin). Drugs not listed above may have an, as yet, undetected potential to prolong the QT interval, updated information on QT prolonging agents is available at this website:QT prolonging agents   []  [x]  Patient received a dose of hydrochlorothiazide (Oretic) alone or in any combination including triamterene (Dyazide, Maxzide) in the last 48 hours.   []  [x]  Patient received a  medication known to increase dofetilide plasma concentrations prior to initial dofetilide dose:  . Trimethoprim (Primsol, Proloprim) in the last 36 hours . Verapamil (Calan, Verelan) in the last 36 hours or a sustained release dose in the last 72 hours . Megestrol (Megace) in the last 5 days  . Cimetidine (Tagamet) in the last 6 hours . Ketoconazole (Nizoral) in the last 24 hours . Itraconazole (Sporanox) in the last 48 hours  . Prochlorperazine (Compazine) in the last 36 hours    []  [x]  Patient is known to have a history of torsades de pointes; congenital or acquired long QT syndromes.   []  [x]  Patient has received a Class 1 antiarrhythmic with less than 2 half-lives since last dose. (Disopyramide, Quinidine, Procainamide, Lidocaine, Mexiletine, Flecainide, Propafenone) Last Flecainide 06/13/17  []  [x]  Patient has received amiodarone therapy in the past 3 months or amiodarone level is greater than 0.3 ng/ml.    Patient has been appropriately anticoagulated with Xarelto.  Ordering provider was confirmed at [email or web address] if they are not listed on the [place] Prescribers list.  Goal of Therapy: Follow renal function, electrolytes, potential drug interactions, and dose adjustment. Provide education and 1 week supply at discharge.  Plan:  [x]   Physician selected initial dose within range recommended for patients level of renal function - will monitor for response.  []   Physician selected initial dose outside of range recommended for patients level of renal function - will discuss if the dose should be altered at this time.   Select One Calculated CrCl  Dose q12h  [x]  > 60 ml/min 500 mcg  []  40-60 ml/min 250 mcg  []  20-40 ml/min 125 mcg   2. Follow up QTc after the first 5 doses, renal function, electrolytes (K & Mg) daily x 3     days, dose adjustment, success of initiation and facilitate 1 week discharge supply as     clinically indicated.  3. Initiate Tikosyn education  video (Call [phone] and ask for Tikosyn Video # 116)..  4. First dose of Tikosyn now, next dose at midnight tonight. Subsequent doses to be given ~q11 hrs til 8a/8p dosing times reached.  Discussed with patient and wife and RN.  5. Takes Xarelto and Toprol with breakfast.  Took doses at home this morning.  Arty Baumgartner, [place] Pager: [phone], or [phone]  06/18/2017 1:35 PM

## 2017-06-19 LAB — BASIC METABOLIC PANEL
Anion gap: 9 (ref 5–15)
BUN: 12 mg/dL (ref 6–20)
CHLORIDE: 105 mmol/L (ref 101–111)
CO2: 24 mmol/L (ref 22–32)
CREATININE: 1.05 mg/dL (ref 0.61–1.24)
Calcium: 8.9 mg/dL (ref 8.9–10.3)
GFR calc Af Amer: >60 mL/min (ref >60)
GFR calc non Af Amer: >60 mL/min (ref >60)
Glucose, Bld: 100 mg/dL — ABNORMAL HIGH (ref 65–99)
Potassium: 4.3 mmol/L (ref 3.5–5.1)
SODIUM: 138 mmol/L (ref 135–145)

## 2017-06-19 LAB — MAGNESIUM: Magnesium: 2.1 mg/dL (ref 1.7–2.4)

## 2017-06-19 NOTE — Progress Notes (Signed)
   Electrophysiology Rounding Note  Patient Name: Hector Clark Date of Encounter: 06/19/2017  Electrophysiologist: Allred   Subjective   The patient is doing well today.  At this time, the patient denies chest pain, shortness of breath, or any new concerns. He is symptomatically improved in SR.   Inpatient Medications    Scheduled Meds: . dofetilide  500 mcg Oral BID  . metoprolol succinate  25 mg Oral Q breakfast  . rivaroxaban  20 mg Oral Q breakfast  . sodium chloride flush  3 mL Intravenous Q12H   Continuous Infusions: . sodium chloride     PRN Meds: sodium chloride, sodium chloride flush   Vital Signs    Vitals:   06/18/17 1358 06/18/17 2100 06/19/17 0000 06/19/17 0400  BP: 115/75 (!) 92/56 118/63 (!) 112/58  Pulse: 65 (!) 55  (!) 54  Resp:  18  18  Temp: 97.9 F (36.6 C) 97.8 F (36.6 C)  97.8 F (36.6 C)  TempSrc: Oral Oral  Oral  SpO2: 100% 96%  96%  Weight:  231 lb 4.2 oz (104.9 kg)    Height:  6\' 1"  (1.854 m)      Intake/Output Summary (Last 24 hours) at 06/19/2017 5462 Last data filed at 06/18/2017 2100 Gross per 24 hour  Intake 420 ml  Output -  Net 420 ml   Filed Weights   06/18/17 2100  Weight: 231 lb 4.2 oz (104.9 kg)    Physical Exam    GEN- The patient is well appearing, alert and oriented x 3 today.   Head- normocephalic, atraumatic Eyes-  Sclera clear, conjunctiva pink Ears- hearing intact Oropharynx- clear Neck- supple Lungs- Clear to ausculation bilaterally, normal work of breathing Heart- Regular rate and rhythm  GI- soft, NT, ND, + BS Extremities- no clubbing, cyanosis, or edema Skin- no rash or lesion Psych- euthymic mood, full affect Neuro- strength and sensation are intact  Labs    Basic Metabolic Panel Recent Labs    06/18/17 0942 06/19/17 0401  NA 138 138  K 4.3 4.3  CL 106 105  CO2 23 24  GLUCOSE 133* 100*  BUN 8 12  CREATININE 0.93 1.05  CALCIUM 9.3 8.9  MG 1.8 2.1     Telemetry    Sinus  rhythm (personally reviewed)  Radiology    No results found.   Patient Profile     Hector Clark is a 72 y.o. male admitted for Tikosyn load   Assessment & Plan    1.  Persistent atrial fibrillation Admitted for Tikosyn load Converted after 1st dose BMET, Mg, QTc stable Continue [place] for CHADS2VASC of 1   Signed, Chanetta Marshall, NP  06/19/2017, 9:22 AM   EP Attending  Patient seen and examined. ECG  Reviewed. He has reverted to NSR and feels better. We will follow his QT interval and plan for continued dofetilide at the current dose. If QT prolongs much more, his dose will need to be reduced.  Mikle Bosworth.D

## 2017-06-20 ENCOUNTER — Ambulatory Visit (HOSPITAL_COMMUNITY): Payer: Medicare Other | Admitting: Nurse Practitioner

## 2017-06-20 LAB — BASIC METABOLIC PANEL
Anion gap: 8 (ref 5–15)
BUN: 12 mg/dL (ref 6–20)
CHLORIDE: 106 mmol/L (ref 101–111)
CO2: 23 mmol/L (ref 22–32)
Calcium: 9 mg/dL (ref 8.9–10.3)
Creatinine, Ser: 0.84 mg/dL (ref 0.61–1.24)
GFR calc Af Amer: >60 mL/min (ref >60)
GFR calc non Af Amer: >60 mL/min (ref >60)
Glucose, Bld: 104 mg/dL — ABNORMAL HIGH (ref 65–99)
POTASSIUM: 4.5 mmol/L (ref 3.5–5.1)
SODIUM: 137 mmol/L (ref 135–145)

## 2017-06-20 LAB — MAGNESIUM: Magnesium: 2 mg/dL (ref 1.7–2.4)

## 2017-06-20 NOTE — Consult Note (Signed)
[facility] CM Primary Care Navigator  06/20/2017  Hector Clark 01/03/1946 [id number]  Met with patientat the bedside to identify possible discharge needs. Patientreports having"persistent rapid, irregular heart beats "thatresultedto this admission (for Tikosyn load/ monitoring).  Patientendorses Dr.John Griffin with [facility] [place] as his primary care provider.   [facility] [address] [facility] without difficulty.   Patientreports that hehas been managing his ownmedications at home using "pill box"systemfilled once a week.  Patientmentioned thathe was driving prior to admission but wife (Jean) can providetransportation to hisdoctors'appointments after discharge if needed.  Patientverbalized that his wife will serve as the primary caregiver at home.  Anticipated discharge plan ishome according to patient.  Patientexpressed understanding to call primary care provider's officewhen he returns homefor a post discharge follow-up appointment within1-2weeksor sooner if needs arise.Patient letter (with PCP's contact number) wasprovided as a reminder.  Explained to patientabout [facility] CM services available for healthmanagement at homebuthe deniesany current needs or concernsfor now. Patient voiced understandingto seekreferralfrom primary care providerto [facility] care management if deemed necessary andappropriate for any services in the future.  [facility] care management information provided for future needs thathe may have.   Patienthowever,opted and verbally agreed for EMMI calls to follow-up hisrecovery at home.   Referral madeforEMMI General calls after discharge.   For questions, please contact:  Lorraine Ajel, BSN, RN- [facility] Primary Care Navigator  Telephone: [phone] [facility] 

## 2017-06-20 NOTE — Progress Notes (Signed)
Please see paper chart for EKG post administration of 4th dose of Tikosyn. Reads: sinus bradycardia at 55bpm. QT/QTc: 490/468. Unfortunately the EKG file is not transmitting, at this time, to the EMR.

## 2017-06-20 NOTE — Progress Notes (Signed)
   Progress Note  Patient Name: Hector Clark Date of Encounter: 06/20/2017  Primary Cardiologist: Nelva Bush, MD   Subjective   No chest pain, sob, or palpitations.   Inpatient Medications    Scheduled Meds: . dofetilide  500 mcg Oral BID  . metoprolol succinate  25 mg Oral Q breakfast  . rivaroxaban  20 mg Oral Q breakfast  . sodium chloride flush  3 mL Intravenous Q12H   Continuous Infusions: . sodium chloride     PRN Meds: sodium chloride, sodium chloride flush   Vital Signs    Vitals:   06/19/17 1358 06/19/17 2154 06/20/17 0537 06/20/17 1345  BP: (!) 99/53 129/65 113/70 115/61  Pulse: (!) 59 (!) 55 (!) 56 (!) 57  Resp:  17 18   Temp: 97.7 F (36.5 C) 97.6 F (36.4 C) 98.1 F (36.7 C) 97.8 F (36.6 C)  TempSrc: Oral Oral Oral Oral  SpO2: 100% 97% 95%   Weight:   227 lb 1.6 oz (103 kg)   Height:        Intake/Output Summary (Last 24 hours) at 06/20/2017 2035 Last data filed at 06/20/2017 1700 Gross per 24 hour  Intake 240 ml  Output -  Net 240 ml   Filed Weights   06/18/17 2100 06/20/17 0537  Weight: 231 lb 4.2 oz (104.9 kg) 227 lb 1.6 oz (103 kg)    Telemetry    NSR - Personally Reviewed  ECG    NSR with acceptable QTC - Personally Reviewed  Physical Exam   GEN: No acute distress.   Neck: 6 cm JVD Cardiac: RRR, no murmurs, rubs, or gallops.  Respiratory: Clear to auscultation bilaterally. GI: Soft, nontender, non-distended  MS: No edema; No deformity. Neuro:  Nonfocal  Psych: Normal affect   Labs    Chemistry Recent Labs  Lab 06/18/17 0942 06/19/17 0401 06/20/17 0516  NA 138 138 137  K 4.3 4.3 4.5  CL 106 105 106  CO2 23 24 23   GLUCOSE 133* 100* 104*  BUN 8 12 12   CREATININE 0.93 1.05 0.84  CALCIUM 9.3 8.9 9.0  GFRNONAA >60 >60 >60  GFRAA >60 >60 >60  ANIONGAP 9 9 8      HematologyNo results for input(s): WBC, RBC, HGB, HCT, MCV, MCH, MCHC, RDW, PLT in the last 168 hours.  Cardiac EnzymesNo results for  input(s): TROPONINI in the last 168 hours. No results for input(s): TROPIPOC in the last 168 hours.   BNPNo results for input(s): BNP, PROBNP in the last 168 hours.   DDimer No results for input(s): DDIMER in the last 168 hours.   Radiology    No results found.  Cardiac Studies   none  Patient Profile     72 y.o. male admitted for initiation of dofetilide. He is currently doing well.  Assessment & Plan    1. Persistent atrial fib - He will continue dofetilide and follow QT. He is expected to be discharged home tomorrow with the usual followup. 2. Coags - he will continue systemic anti-coagulation with no missed doses.  For questions or updates, please contact [place] Please consult [email or web address] for contact info under Cardiology/STEMI.      Signed, Cristopher Peru, MD  06/20/2017, 8:35 PM  Patient ID: Hector Clark, male   DOB: 06/24/1945, 72 y.o.   MRN: [id number]

## 2017-06-21 LAB — BASIC METABOLIC PANEL
ANION GAP: 7 (ref 5–15)
BUN: 14 mg/dL (ref 6–20)
CO2: 25 mmol/L (ref 22–32)
Calcium: 9.2 mg/dL (ref 8.9–10.3)
Chloride: 105 mmol/L (ref 101–111)
Creatinine, Ser: 1.02 mg/dL (ref 0.61–1.24)
GFR calc Af Amer: >60 mL/min (ref >60)
Glucose, Bld: 120 mg/dL — ABNORMAL HIGH (ref 65–99)
POTASSIUM: 4.2 mmol/L (ref 3.5–5.1)
SODIUM: 137 mmol/L (ref 135–145)

## 2017-06-21 LAB — MAGNESIUM: MAGNESIUM: 2 mg/dL (ref 1.7–2.4)

## 2017-06-21 MED ORDER — FENTANYL CITRATE (PF) 100 MCG/2ML IJ SOLN
INTRAMUSCULAR | Status: AC
  Filled 2017-06-21: qty 2

## 2017-06-21 MED ORDER — DOFETILIDE 500 MCG PO CAPS: 500.0000 ug | ORAL_CAPSULE | Freq: Two times a day (BID) | ORAL | 0 refills | Status: DC

## 2017-06-21 MED ORDER — MIDAZOLAM HCL 2 MG/2ML IJ SOLN
INTRAMUSCULAR | Status: AC
  Filled 2017-06-21: qty 2

## 2017-06-21 MED ORDER — DOFETILIDE 500 MCG PO CAPS: 500.0000 ug | ORAL_CAPSULE | Freq: Two times a day (BID) | ORAL | 11 refills | Status: DC

## 2017-06-21 MED ORDER — MIDAZOLAM HCL 2 MG/2ML IJ SOLN
2.0000 mg | INTRAMUSCULAR | Status: AC
  Administered 2017-06-21: 2 mg via INTRAVENOUS

## 2017-06-21 MED ORDER — METOPROLOL SUCCINATE ER 25 MG PO TB24: 25.0000 mg | ORAL_TABLET | Freq: Every day | ORAL | 11 refills | Status: DC

## 2017-06-21 MED ORDER — FENTANYL CITRATE (PF) 100 MCG/2ML IJ SOLN
50.0000 ug | INTRAMUSCULAR | Status: AC
  Administered 2017-06-21: 50 ug via INTRAVENOUS

## 2017-06-21 NOTE — Discharge Summary (Signed)
Discharge Summary    Patient ID: Zane Pellecchia,  MRN: [id number], DOB/AGE: 1946-01-06 72 y.o.  Admit date: 06/18/2017 Discharge date: 06/21/2017  Primary Care Provider: Lavone Orn Primary Cardiologist: Nelva Bush, MD  Electrophysiologist: Dr Rayann Heman   Discharge Diagnoses    Active Problems:   Persistent atrial fibrillation ([place])   Allergies No Known Allergies  Diagnostic Studies/Procedures    None _____________   History of Present Illness     Manu Rubey a 72 y.o.malewith a h/o persistent afib that is in the afib clinic for f/u appointment with Dr. Rayann Heman 12/17/18to discuss his options to restore SR. At that time it was decided that he would try flecainide but he had been taking his DOAC very irregularly and would need to be on uninterrupted anticoagulation x 3 weeks to start flecainide. He has consistently taken since 12/26, which was 3 weeks yesterday.  He startedflecainide and the drug returned him to SR, without cardioversion, but it had to be stopped 2/2 interval lengthening. He stopped flecainide last Thursday PM and was back into afib by Saturday pm. He had chest pain last Sunday associated with return of afib. He had a stress test last August that was low risk. He is not having chest pain today. He is rate controlled today. Aware of price of drug and wants to proceed to hospitalization for tikosyn loading.  Hospital Course     Consultants: None   Mr Oldaker was started on Tikosyn, the dose was calculated to be 500 mcg twice daily.  His labs and QTC were followed closely on this.  He had been compliant with his Xarelto prior to admission and this was continued.  He went back into sinus rhythm after the first dose.   However, he had an episode of atrial fibrillation on the night of 06/20/2017. Initially, it was felt that they would have to cardiovert him, but he converted spontaneously after they gave him the sedation.  He woke from  sedation and was still maintaining sinus rhythm.  He had no further episodes of atrial fibrillation.  On 06/21/2017, He was seen by Dr. Lovena Le and all data were reviewed.  His labs are stable and no further workup needs to be done.  He was advised that it is okay to take an extra 1/2 tablet of metoprolol when he gets atrial fib and wait for a little while to see if it resolves.  If it does not resolve, he will have to come in,   As he is very symptomatic with it.     Mr. Nawrot is considered stable for discharge, to follow-up as an outpatient.  _____________  Discharge Vitals Blood pressure 115/73, pulse (!) 58, temperature 97.7 F (36.5 C), temperature source Oral, resp. rate 18, height 6\' 1"  (1.854 m), weight 227 lb 1.2 oz (103 kg), SpO2 95 %.  Filed Weights   06/18/17 2100 06/20/17 0537 06/21/17 0438  Weight: 231 lb 4.2 oz (104.9 kg) 227 lb 1.6 oz (103 kg) 227 lb 1.2 oz (103 kg)  General: Well developed, well nourished, male in no acute distress Head: Eyes PERRLA, No xanthomas.   Normocephalic and atraumatic  Lungs: Clear bilaterally to auscultation. Heart: HRRR S1 S2, without MRG.  Pulses are 2+ & equal. No carotid bruit. No JVD. Abdomen: Bowel sounds are present, abdomen soft and non-tender without masses or  hernias noted. Msk: Normal strength and tone for age. Extremities: No clubbing, cyanosis or edema.    Skin:  No rashes or  lesions noted. Neuro: Alert and oriented X 3. Psych:  Good affect, responds appropriately  Labs & Radiologic Studies    CBC No results for input(s): WBC, NEUTROABS, HGB, HCT, MCV, PLT in the last 72 hours. Basic Metabolic Panel Recent Labs    06/20/17 0516 06/21/17 0353  NA 137 137  K 4.5 4.2  CL 106 105  CO2 23 25  GLUCOSE 104* 120*  BUN 12 14  CREATININE 0.84 1.02  CALCIUM 9.0 9.2  MG 2.0 2.0   Liver Function Tests No results for input(s): AST, ALT, ALKPHOS, BILITOT, PROT, ALBUMIN in the last 72 hours. No results for input(s): LIPASE,  AMYLASE in the last 72 hours. Cardiac Enzymes No results for input(s): CKTOTAL, CKMB, CKMBINDEX, TROPONINI in the last 72 hours. BNP Invalid input(s): POCBNP D-Dimer No results for input(s): DDIMER in the last 72 hours. Hemoglobin A1C No results for input(s): HGBA1C in the last 72 hours. Fasting Lipid Panel No results for input(s): CHOL, HDL, LDLCALC, TRIG, CHOLHDL, LDLDIRECT in the last 72 hours. Thyroid Function Tests No results for input(s): TSH, T4TOTAL, T3FREE, THYROIDAB in the last 72 hours.  Invalid input(s): FREET3 _____________  No results found. Disposition   Pt is being discharged home today in good condition.  Follow-up Plans & Appointments     Discharge Instructions    Diet - low sodium heart healthy   Complete by:  As directed    Increase activity slowly   Complete by:  As directed       Discharge Medications   Allergies as of 06/21/2017   No Known Allergies     Medication List    TAKE these medications   dofetilide 500 MCG capsule Commonly known as:  TIKOSYN Take 1 capsule (500 mcg total) by mouth 2 (two) times daily.   ECHINACEA PO Take 1 capsule by mouth 2 (two) times daily as needed (for cold symptoms).   ELDERBERRY PO Take 15 mLs by mouth daily as needed (for cold symptoms).   metoprolol succinate 25 MG 24 hr tablet Commonly known as:  TOPROL XL Take 1 tablet (25 mg total) by mouth daily. Ok to take an extra 1/2 tablet prn for afib What changed:    when to take this  additional instructions   NON FORMULARY [place] liquid: Take 5 ml's by mouth once a day as needed for coughing   rivaroxaban 20 MG Tabs tablet Commonly known as:  XARELTO Take 1 tablet (20 mg total) by mouth daily with supper. What changed:  when to take this   VITAMIN B-12 PO Take 1 tablet by mouth 2 (two) times a week.   vitamin C 500 MG tablet Commonly known as:  ASCORBIC ACID Take 500 mg by mouth 2 (two) times a week.   vitamin E 400 UNIT capsule Take 400  Units by mouth 2 (two) times a week.         Outstanding Labs/Studies   None   Duration of Discharge Encounter   Greater than 30 minutes including physician time.  Alcario Drought Barrett NP 06/21/2017, 11:54 AM  EP Attending  Patient seen and examined. Agree with above. He is stable after initiation of dofetilide. [place] for DC with usual followup.  Mikle Bosworth.D.

## 2017-06-21 NOTE — Progress Notes (Addendum)
0058: this nurse notified by Tele that Mr. [name] was in wide complex tachycardia vs AFIB with RVR. Rate 100-120's. Pt. Previously NSR 60's. Upon assessment patient found to be diaphoretic and in visual distress with c/o chest tightness. Patient stated "this is how my body always feels when my heart flips into afib or anything different". Vitals obtained: Temp: 18F; BP: 121/81(91); HR: 116; RR: 18; SpO2: 98% RA.   0100: Multiple EKGS obtained with the following results:  1) Atrial fibrillation with premature ventricular or aberrantly conducted complexes 2) Atrial fibrillation with rapid ventricular response with premature ventricular or aberrantly conducted complexes; Left bundle branch block 3) ** Critical Test Result: Arrhythmia; Undetermined rhythm   0100: Notified Dr. Hassell Done with cards of the following via page:  "6E07:[name],R:Tikosyn patient, received 5th dose. notified by tele patient in wide complex qrs in the 120's. patient with intermittent CP"  0105: Dr. Hassell Done called back and on the way to bedside.   0115: Dr. Hassell Done paged Dr. Lovena Le for further advice.   0125[name] Rough (Rapid response nurse) called to bedside for impending cardioversion advised by Dr. Lovena Le. Respiratory also called to beside. [place], suction, crash cart in room.   0132: 50mg  Fentanyl IV and 1mg  versed IV administered. HR: 116 BP: 152/109(122). O2: 100% on 3L [place].  0134: additional 1mg  Versed IV administered. HR: 118 wide complex tachycardia  BP: 148/115(126); o2 100% on 3L [place].    0136: patient converted back to NSR 60's without any additional intervention.   7902: Additional EKG obtained with following result:  1) Normal sinus rhythm  Patient now resting in bed without complaints. A&Ox4, POSS of 2.  0515: UPDATE: patient now fully awake post conscious sedation. HR in the 60's NSR. No complaints to offer. Ambulated to restroom independently under supervision. HR maintained in the 60's with  activity and SpO2 high 90's.

## 2017-06-21 NOTE — Significant Event (Addendum)
Rapid Response Event Note  Overview: Call Time 0118 Arrival Time 0120  Initial Focused Assessment: Called by RN about patient having wide complex tachycardia in the 120s. RN called Cards MD on call. When I arrived, Cards MD was the bedside.  Patient was alert, moaning occassionally, diaphoretic, clammy, stated he has some chest pressure. SBP in the 140-150s. Sats maintaining 99% on RA, not in acute respiratory distress.   Interventions: - Emergent Cardioversion with Conscious Sedation.  Procedure Note: RT came to bedside, patient was given 3L oxygen via [place] for preoxygenation. MD was at the bedside. Timeout was performed. Conscious Sedation administered under continuous telemetry and oxygen saturation monitoring. Patient was given Fentanyl 33mcg IV and Versed 2mg  IV, after sedation, rhythm converted back into SR, EKG confirmed SR. HR now in the 60s, SBP still maintaining in the 140s, saturations maintaining greater than 98%. Patient was not cardioverted. Will monitor.   Plan of Care (if not transferred): - Keep defib pads on patient - Keep oxygen on patient as needed and monitor respiratory status since sedation was given - Keep ambu bag at bedside. - Monitor neuro exam.  Event Summary:   at    End Time [place], [place]

## 2017-06-21 NOTE — Progress Notes (Signed)
[name] to be D/C'd home per MD order.  Discussed with the patient and all questions fully answered.  VSS, Skin clean, dry and intact without evidence of skin break down, no evidence of skin tears noted. IV catheter discontinued intact. Site without signs and symptoms of complications. Dressing and pressure applied.  An After Visit Summary was printed and given to the patient. Patient received prescription.  D/c education completed with patient/family including follow up instructions, medication list, d/c activities limitations if indicated, with other d/c instructions as indicated by MD - patient able to verbalize understanding, all questions fully answered.   Patient instructed to return to ED, call 911, or call MD for any changes in condition.   Patient escorted via [place], and D/C home via private auto.  Milas Hock 06/21/2017 1:58 PM

## 2017-06-24 ENCOUNTER — Inpatient Hospital Stay (HOSPITAL_COMMUNITY)
Admission: EM | Admit: 2017-06-24 | Discharge: 2017-06-28 | DRG: 228 | Disposition: A | Discharge status: Home / Self Care | Payer: Medicare Other | Attending: Thoracic Surgery (Cardiothoracic Vascular Surgery) | Admitting: Thoracic Surgery (Cardiothoracic Vascular Surgery)

## 2017-06-24 ENCOUNTER — Emergency Department (HOSPITAL_COMMUNITY): Payer: Medicare Other | Admitting: Certified Registered"

## 2017-06-24 ENCOUNTER — Other Ambulatory Visit: Payer: Self-pay

## 2017-06-24 ENCOUNTER — Other Ambulatory Visit: Payer: Self-pay | Admitting: *Deleted

## 2017-06-24 ENCOUNTER — Inpatient Hospital Stay (HOSPITAL_COMMUNITY): Payer: Medicare Other

## 2017-06-24 ENCOUNTER — Emergency Department (HOSPITAL_COMMUNITY): Payer: Medicare Other

## 2017-06-24 ENCOUNTER — Encounter (HOSPITAL_COMMUNITY)
Admission: EM | Disposition: A | Discharge status: Home / Self Care | Payer: Self-pay | Source: Home / Self Care | Attending: Thoracic Surgery (Cardiothoracic Vascular Surgery)

## 2017-06-24 ENCOUNTER — Telehealth: Payer: Self-pay | Admitting: Cardiology

## 2017-06-24 ENCOUNTER — Other Ambulatory Visit (HOSPITAL_COMMUNITY): Payer: Self-pay

## 2017-06-24 ENCOUNTER — Ambulatory Visit (HOSPITAL_COMMUNITY)
Admission: RE | Admit: 2017-06-24 | Discharge: 2017-06-24 | Disposition: A | Discharge status: Home / Self Care | Payer: Medicare Other | Source: Ambulatory Visit | Attending: Nurse Practitioner | Admitting: Nurse Practitioner

## 2017-06-24 ENCOUNTER — Encounter (HOSPITAL_COMMUNITY): Payer: Self-pay | Admitting: *Deleted

## 2017-06-24 ENCOUNTER — Inpatient Hospital Stay (HOSPITAL_COMMUNITY)
Admission: EM | Disposition: A | Discharge status: Home / Self Care | Payer: Self-pay | Source: Home / Self Care | Attending: Thoracic Surgery (Cardiothoracic Vascular Surgery)

## 2017-06-24 DIAGNOSIS — Z800 Family history of malignant neoplasm of digestive organs: Secondary | ICD-10-CM

## 2017-06-24 DIAGNOSIS — R0602 Shortness of breath: Secondary | ICD-10-CM | POA: Diagnosis not present

## 2017-06-24 DIAGNOSIS — Z9889 Other specified postprocedural states: Secondary | ICD-10-CM

## 2017-06-24 DIAGNOSIS — D72829 Elevated white blood cell count, unspecified: Secondary | ICD-10-CM | POA: Diagnosis not present

## 2017-06-24 DIAGNOSIS — R570 Cardiogenic shock: Secondary | ICD-10-CM | POA: Diagnosis present

## 2017-06-24 DIAGNOSIS — Z951 Presence of aortocoronary bypass graft: Secondary | ICD-10-CM

## 2017-06-24 DIAGNOSIS — E785 Hyperlipidemia, unspecified: Secondary | ICD-10-CM | POA: Diagnosis present

## 2017-06-24 DIAGNOSIS — J9811 Atelectasis: Secondary | ICD-10-CM

## 2017-06-24 DIAGNOSIS — I214 Non-ST elevation (NSTEMI) myocardial infarction: Principal | ICD-10-CM | POA: Diagnosis present

## 2017-06-24 DIAGNOSIS — I25810 Atherosclerosis of coronary artery bypass graft(s) without angina pectoris: Secondary | ICD-10-CM | POA: Diagnosis not present

## 2017-06-24 DIAGNOSIS — I4819 Other persistent atrial fibrillation: Secondary | ICD-10-CM | POA: Diagnosis present

## 2017-06-24 DIAGNOSIS — D6959 Other secondary thrombocytopenia: Secondary | ICD-10-CM | POA: Diagnosis not present

## 2017-06-24 DIAGNOSIS — I481 Persistent atrial fibrillation: Secondary | ICD-10-CM | POA: Diagnosis not present

## 2017-06-24 DIAGNOSIS — I2510 Atherosclerotic heart disease of native coronary artery without angina pectoris: Secondary | ICD-10-CM | POA: Diagnosis present

## 2017-06-24 DIAGNOSIS — I472 Ventricular tachycardia: Secondary | ICD-10-CM | POA: Diagnosis present

## 2017-06-24 DIAGNOSIS — R072 Precordial pain: Secondary | ICD-10-CM

## 2017-06-24 DIAGNOSIS — I213 ST elevation (STEMI) myocardial infarction of unspecified site: Secondary | ICD-10-CM

## 2017-06-24 DIAGNOSIS — R001 Bradycardia, unspecified: Secondary | ICD-10-CM | POA: Diagnosis present

## 2017-06-24 DIAGNOSIS — D62 Acute posthemorrhagic anemia: Secondary | ICD-10-CM | POA: Diagnosis not present

## 2017-06-24 DIAGNOSIS — M109 Gout, unspecified: Secondary | ICD-10-CM | POA: Diagnosis present

## 2017-06-24 DIAGNOSIS — I25110 Atherosclerotic heart disease of native coronary artery with unstable angina pectoris: Secondary | ICD-10-CM | POA: Diagnosis not present

## 2017-06-24 DIAGNOSIS — Z7901 Long term (current) use of anticoagulants: Secondary | ICD-10-CM

## 2017-06-24 DIAGNOSIS — Z8679 Personal history of other diseases of the circulatory system: Secondary | ICD-10-CM

## 2017-06-24 DIAGNOSIS — R079 Chest pain, unspecified: Secondary | ICD-10-CM | POA: Diagnosis not present

## 2017-06-24 DIAGNOSIS — J90 Pleural effusion, not elsewhere classified: Secondary | ICD-10-CM | POA: Diagnosis not present

## 2017-06-24 DIAGNOSIS — I959 Hypotension, unspecified: Secondary | ICD-10-CM | POA: Diagnosis present

## 2017-06-24 HISTORY — DX: Presence of aortocoronary bypass graft: Z95.1

## 2017-06-24 HISTORY — DX: Personal history of other diseases of the circulatory system: Z86.79

## 2017-06-24 HISTORY — PX: MAZE: SHX5063

## 2017-06-24 HISTORY — PX: CLIPPING OF ATRIAL APPENDAGE: SHX5773

## 2017-06-24 HISTORY — PX: TEE WITHOUT CARDIOVERSION: SHX5443

## 2017-06-24 HISTORY — PX: LEFT HEART CATH AND CORONARY ANGIOGRAPHY: CATH118249

## 2017-06-24 HISTORY — DX: Personal history of other diseases of the circulatory system: Z98.890

## 2017-06-24 HISTORY — PX: CORONARY ARTERY BYPASS GRAFT: SHX141

## 2017-06-24 HISTORY — DX: Other specified postprocedural states: Z98.890

## 2017-06-24 HISTORY — PX: IABP INSERTION: CATH118242

## 2017-06-24 LAB — POCT I-STAT, CHEM 8
BUN: 15 mg/dL (ref 6–20)
BUN: 12 mg/dL (ref 6–20)
BUN: 14 mg/dL (ref 6–20)
BUN: 13 mg/dL (ref 6–20)
BUN: 13 mg/dL (ref 6–20)
CALCIUM ION: 1.03 mmol/L — AB (ref 1.15–1.40)
CALCIUM ION: 1.16 mmol/L (ref 1.15–1.40)
CALCIUM ION: 1.05 mmol/L — AB (ref 1.15–1.40)
CALCIUM ION: 1.06 mmol/L — AB (ref 1.15–1.40)
CHLORIDE: 107 mmol/L (ref 101–111)
CHLORIDE: 108 mmol/L (ref 101–111)
CHLORIDE: 99 mmol/L — AB (ref 101–111)
CHLORIDE: 104 mmol/L (ref 101–111)
CREATININE: 0.6 mg/dL — AB (ref 0.61–1.24)
CREATININE: 0.5 mg/dL — AB (ref 0.61–1.24)
CREATININE: 0.6 mg/dL — AB (ref 0.61–1.24)
CREATININE: 0.6 mg/dL — AB (ref 0.61–1.24)
Calcium, Ion: 1.21 mmol/L (ref 1.15–1.40)
Chloride: 102 mmol/L (ref 101–111)
Creatinine, Ser: 0.6 mg/dL — ABNORMAL LOW (ref 0.61–1.24)
GLUCOSE: 160 mg/dL — AB (ref 65–99)
GLUCOSE: 145 mg/dL — AB (ref 65–99)
GLUCOSE: 169 mg/dL — AB (ref 65–99)
GLUCOSE: 158 mg/dL — AB (ref 65–99)
GLUCOSE: 169 mg/dL — AB (ref 65–99)
HCT: 32 % — ABNORMAL LOW (ref 39.0–52.0)
HCT: 42 % (ref 39.0–52.0)
HCT: 33 % — ABNORMAL LOW (ref 39.0–52.0)
HCT: 32 % — ABNORMAL LOW (ref 39.0–52.0)
HEMATOCRIT: 41 % (ref 39.0–52.0)
HEMOGLOBIN: 13.9 g/dL (ref 13.0–17.0)
Hemoglobin: 10.9 g/dL — ABNORMAL LOW (ref 13.0–17.0)
Hemoglobin: 14.3 g/dL (ref 13.0–17.0)
Hemoglobin: 11.2 g/dL — ABNORMAL LOW (ref 13.0–17.0)
Hemoglobin: 10.9 g/dL — ABNORMAL LOW (ref 13.0–17.0)
POTASSIUM: 4.4 mmol/L (ref 3.5–5.1)
POTASSIUM: 4.2 mmol/L (ref 3.5–5.1)
POTASSIUM: 4.4 mmol/L (ref 3.5–5.1)
Potassium: 5.5 mmol/L — ABNORMAL HIGH (ref 3.5–5.1)
Potassium: 5.3 mmol/L — ABNORMAL HIGH (ref 3.5–5.1)
SODIUM: 139 mmol/L (ref 135–145)
Sodium: 139 mmol/L (ref 135–145)
Sodium: 134 mmol/L — ABNORMAL LOW (ref 135–145)
Sodium: 136 mmol/L (ref 135–145)
Sodium: 135 mmol/L (ref 135–145)
TCO2: 21 mmol/L — ABNORMAL LOW (ref 22–32)
TCO2: 21 mmol/L — ABNORMAL LOW (ref 22–32)
TCO2: 23 mmol/L (ref 22–32)
TCO2: 26 mmol/L (ref 22–32)
TCO2: 24 mmol/L (ref 22–32)

## 2017-06-24 LAB — PLATELET COUNT: Platelets: 128 10*3/uL — ABNORMAL LOW (ref 150–400)

## 2017-06-24 LAB — MAGNESIUM: Magnesium: 2.2 mg/dL (ref 1.7–2.4)

## 2017-06-24 LAB — POCT I-STAT TROPONIN I: Troponin i, poc: 0.2 ng/mL (ref 0.00–0.08)

## 2017-06-24 LAB — GLUCOSE, CAPILLARY
GLUCOSE-CAPILLARY: 118 mg/dL — AB (ref 65–99)
Glucose-Capillary: 164 mg/dL — ABNORMAL HIGH (ref 65–99)
Glucose-Capillary: 119 mg/dL — ABNORMAL HIGH (ref 65–99)
Glucose-Capillary: 121 mg/dL — ABNORMAL HIGH (ref 65–99)
Glucose-Capillary: 100 mg/dL — ABNORMAL HIGH (ref 65–99)

## 2017-06-24 LAB — COMPREHENSIVE METABOLIC PANEL
ALK PHOS: 122 U/L (ref 38–126)
ALT: 25 U/L (ref 17–63)
AST: 41 U/L (ref 15–41)
Albumin: 4 g/dL (ref 3.5–5.0)
Anion gap: 14 (ref 5–15)
BUN: 16 mg/dL (ref 6–20)
CALCIUM: 9.4 mg/dL (ref 8.9–10.3)
CO2: 19 mmol/L — ABNORMAL LOW (ref 22–32)
CREATININE: 0.99 mg/dL (ref 0.61–1.24)
Chloride: 104 mmol/L (ref 101–111)
Glucose, Bld: 172 mg/dL — ABNORMAL HIGH (ref 65–99)
Potassium: 4.5 mmol/L (ref 3.5–5.1)
Sodium: 137 mmol/L (ref 135–145)
Total Bilirubin: 1.1 mg/dL (ref 0.3–1.2)
Total Protein: 7.9 g/dL (ref 6.5–8.1)

## 2017-06-24 LAB — POCT I-STAT 3, ART BLOOD GAS (G3+)
ACID-BASE DEFICIT: 5 mmol/L — AB (ref 0.0–2.0)
Acid-base deficit: 6 mmol/L — ABNORMAL HIGH (ref 0.0–2.0)
BICARBONATE: 19.5 mmol/L — AB (ref 20.0–28.0)
BICARBONATE: 20.7 mmol/L (ref 20.0–28.0)
O2 SAT: 100 %
O2 SAT: 97 %
PCO2 ART: 39.1 mmHg (ref 32.0–48.0)
PH ART: 7.306 — AB (ref 7.350–7.450)
PO2 ART: 101 mmHg (ref 83.0–108.0)
Patient temperature: 36.6
TCO2: 21 mmol/L — AB (ref 22–32)
TCO2: 22 mmol/L (ref 22–32)
pCO2 arterial: 40.9 mmHg (ref 32.0–48.0)
pH, Arterial: 7.31 — ABNORMAL LOW (ref 7.350–7.450)
pO2, Arterial: 319 mmHg — ABNORMAL HIGH (ref 83.0–108.0)

## 2017-06-24 LAB — CBC WITH DIFFERENTIAL/PLATELET
BASOS ABS: 0 10*3/uL (ref 0.0–0.1)
Basophils Relative: 0 %
Eosinophils Absolute: 0 10*3/uL (ref 0.0–0.7)
Eosinophils Relative: 0 %
HCT: 48.5 % (ref 39.0–52.0)
HEMOGLOBIN: 16.8 g/dL (ref 13.0–17.0)
LYMPHS ABS: 1.2 10*3/uL (ref 0.7–4.0)
LYMPHS PCT: 10 %
MCH: 31.9 pg (ref 26.0–34.0)
MCHC: 34.6 g/dL (ref 30.0–36.0)
MCV: 92 fL (ref 78.0–100.0)
Monocytes Absolute: 0.3 10*3/uL (ref 0.1–1.0)
Monocytes Relative: 3 %
NEUTROS ABS: 10.3 10*3/uL — AB (ref 1.7–7.7)
NEUTROS PCT: 87 %
Platelets: 213 10*3/uL (ref 150–400)
RBC: 5.27 MIL/uL (ref 4.22–5.81)
RDW: 13.2 % (ref 11.5–15.5)
WBC: 11.9 10*3/uL — AB (ref 4.0–10.5)

## 2017-06-24 LAB — HEMOGLOBIN AND HEMATOCRIT, BLOOD
HEMATOCRIT: 34.2 % — AB (ref 39.0–52.0)
Hemoglobin: 11.6 g/dL — ABNORMAL LOW (ref 13.0–17.0)

## 2017-06-24 LAB — CBC
HCT: 45.9 % (ref 39.0–52.0)
HEMOGLOBIN: 15.7 g/dL (ref 13.0–17.0)
MCH: 30.9 pg (ref 26.0–34.0)
MCHC: 34.2 g/dL (ref 30.0–36.0)
MCV: 90.4 fL (ref 78.0–100.0)
Platelets: 126 10*3/uL — ABNORMAL LOW (ref 150–400)
RBC: 5.08 MIL/uL (ref 4.22–5.81)
RDW: 12.8 % (ref 11.5–15.5)
WBC: 29 10*3/uL — AB (ref 4.0–10.5)

## 2017-06-24 LAB — ECHO TEE

## 2017-06-24 LAB — APTT: APTT: 29 s (ref 24–36)

## 2017-06-24 LAB — COOXEMETRY PANEL
CARBOXYHEMOGLOBIN: 1.1 % (ref 0.5–1.5)
METHEMOGLOBIN: 0.8 % (ref 0.0–1.5)
O2 Saturation: 62.9 %
TOTAL HEMOGLOBIN: 15.3 g/dL (ref 12.0–16.0)

## 2017-06-24 LAB — PROTIME-INR
INR: 1.11
INR: 1.32
PROTHROMBIN TIME: 14.2 s (ref 11.4–15.2)
Prothrombin Time: 16.3 seconds — ABNORMAL HIGH (ref 11.4–15.2)

## 2017-06-24 LAB — POCT I-STAT 4, (NA,K, GLUC, HGB,HCT)
Glucose, Bld: 158 mg/dL — ABNORMAL HIGH (ref 65–99)
HCT: 44 % (ref 39.0–52.0)
HEMOGLOBIN: 15 g/dL (ref 13.0–17.0)
POTASSIUM: 4.5 mmol/L (ref 3.5–5.1)
SODIUM: 140 mmol/L (ref 135–145)

## 2017-06-24 LAB — ABO/RH: ABO/RH(D): O POS

## 2017-06-24 LAB — PREPARE RBC (CROSSMATCH)

## 2017-06-24 SURGERY — CORONARY ARTERY BYPASS GRAFTING (CABG)
Anesthesia: General | Site: Chest

## 2017-06-24 SURGERY — LEFT HEART CATH AND CORONARY ANGIOGRAPHY
Anesthesia: LOCAL

## 2017-06-24 MED ORDER — HEPARIN (PORCINE) IN NACL 2-0.9 UNIT/ML-% IJ SOLN
INTRAMUSCULAR | Status: AC
  Filled 2017-06-24: qty 500

## 2017-06-24 MED ORDER — FENTANYL CITRATE (PF) 250 MCG/5ML IJ SOLN
INTRAMUSCULAR | Status: AC
  Filled 2017-06-24: qty 5

## 2017-06-24 MED ORDER — HEPARIN SODIUM (PORCINE) 1000 UNIT/ML IJ SOLN
INTRAMUSCULAR | Status: AC
  Filled 2017-06-24: qty 1

## 2017-06-24 MED ORDER — TRANEXAMIC ACID 1000 MG/10ML IV SOLN
1.5000 mg/kg/h | INTRAVENOUS | Status: DC
  Administered 2017-06-24: 1.5 mg/kg/h via INTRAVENOUS
  Filled 2017-06-24: qty 25

## 2017-06-24 MED ORDER — ACETAMINOPHEN 650 MG RE SUPP
650.0000 mg | Freq: Once | RECTAL | Status: AC
  Administered 2017-06-24: 650 mg via RECTAL
  Filled 2017-06-24: qty 1

## 2017-06-24 MED ORDER — DEXTROSE 5 % IV SOLN
1.5000 g | Freq: Two times a day (BID) | INTRAVENOUS | Status: AC
  Administered 2017-06-25 – 2017-06-26 (×4): 1.5 g via INTRAVENOUS
  Filled 2017-06-24 (×4): qty 1.5

## 2017-06-24 MED ORDER — LACTATED RINGERS IV SOLN: 500.0000 mL | Freq: Once | INTRAVENOUS | Status: DC | PRN

## 2017-06-24 MED ORDER — DOFETILIDE 500 MCG PO CAPS
500.0000 ug | ORAL_CAPSULE | Freq: Two times a day (BID) | ORAL | Status: DC
  Administered 2017-06-25 – 2017-06-28 (×7): 500 ug via ORAL
  Filled 2017-06-24 (×8): qty 1

## 2017-06-24 MED ORDER — PLASMA-LYTE 148 IV SOLN
INTRAVENOUS | Status: DC
  Filled 2017-06-24: qty 2.5

## 2017-06-24 MED ORDER — SODIUM CHLORIDE 0.9 % IV SOLN
INTRAVENOUS | Status: DC | PRN
Start: 2017-06-24 — End: 2017-06-24
  Administered 2017-06-24: 15:00:00 via INTRAVENOUS

## 2017-06-24 MED ORDER — ASPIRIN 325 MG PO TABS
ORAL_TABLET | ORAL | Status: AC
  Filled 2017-06-24: qty 1

## 2017-06-24 MED ORDER — MAGNESIUM SULFATE 4 GM/100ML IV SOLN
4.0000 g | Freq: Once | INTRAVENOUS | Status: AC
  Administered 2017-06-24: 4 g via INTRAVENOUS
  Filled 2017-06-24: qty 100

## 2017-06-24 MED ORDER — SODIUM CHLORIDE 0.9 % IV SOLN: 250.0000 mL | INTRAVENOUS | Status: DC

## 2017-06-24 MED ORDER — VANCOMYCIN HCL 1000 MG IV SOLR
INTRAVENOUS | Status: DC
  Filled 2017-06-24: qty 1000

## 2017-06-24 MED ORDER — SODIUM CHLORIDE 0.9 % IV SOLN
30.0000 ug/min | INTRAVENOUS | Status: DC
  Filled 2017-06-24: qty 2

## 2017-06-24 MED ORDER — ALBUMIN HUMAN 5 % IV SOLN
250.0000 mL | INTRAVENOUS | Status: AC | PRN
  Administered 2017-06-24 – 2017-06-25 (×4): 250 mL via INTRAVENOUS
  Filled 2017-06-24 (×2): qty 250

## 2017-06-24 MED ORDER — SODIUM CHLORIDE 0.9 % IV SOLN
INTRAVENOUS | Status: DC
  Administered 2017-06-24: 17:00:00 via INTRAVENOUS

## 2017-06-24 MED ORDER — TRANEXAMIC ACID (OHS) BOLUS VIA INFUSION
15.0000 mg/kg | INTRAVENOUS | Status: DC
  Filled 2017-06-24: qty 1545

## 2017-06-24 MED ORDER — DEXAMETHASONE SODIUM PHOSPHATE 10 MG/ML IJ SOLN
INTRAMUSCULAR | Status: DC | PRN
  Administered 2017-06-24: 10 mg via INTRAVENOUS

## 2017-06-24 MED ORDER — NITROGLYCERIN IN D5W 200-5 MCG/ML-% IV SOLN
2.0000 ug/min | INTRAVENOUS | Status: DC
  Filled 2017-06-24: qty 250

## 2017-06-24 MED ORDER — FAMOTIDINE IN NACL 20-0.9 MG/50ML-% IV SOLN
20.0000 mg | Freq: Two times a day (BID) | INTRAVENOUS | Status: AC
  Administered 2017-06-24 – 2017-06-25 (×2): 20 mg via INTRAVENOUS
  Filled 2017-06-24 (×2): qty 50

## 2017-06-24 MED ORDER — VANCOMYCIN HCL IN DEXTROSE 1-5 GM/200ML-% IV SOLN
1000.0000 mg | Freq: Once | INTRAVENOUS | Status: AC
  Administered 2017-06-25: 1000 mg via INTRAVENOUS
  Filled 2017-06-24: qty 200

## 2017-06-24 MED ORDER — LIDOCAINE HCL (PF) 1 % IJ SOLN
INTRAMUSCULAR | Status: AC
  Filled 2017-06-24: qty 30

## 2017-06-24 MED ORDER — FENTANYL CITRATE (PF) 250 MCG/5ML IJ SOLN
INTRAMUSCULAR | Status: DC | PRN
  Administered 2017-06-24: 100 ug via INTRAVENOUS
  Administered 2017-06-24 (×5): 250 ug via INTRAVENOUS
  Administered 2017-06-24: 150 ug via INTRAVENOUS
  Administered 2017-06-24: 250 ug via INTRAVENOUS

## 2017-06-24 MED ORDER — VANCOMYCIN HCL 10 G IV SOLR
1500.0000 mg | INTRAVENOUS | Status: DC
  Administered 2017-06-24: 1500 mg via INTRAVENOUS
  Filled 2017-06-24: qty 1500

## 2017-06-24 MED ORDER — MIDAZOLAM HCL 2 MG/2ML IJ SOLN
INTRAMUSCULAR | Status: DC | PRN
  Administered 2017-06-24: 2 mg via INTRAVENOUS

## 2017-06-24 MED ORDER — DEXTROSE 5 % IV SOLN
750.0000 mg | INTRAVENOUS | Status: DC
  Filled 2017-06-24: qty 750

## 2017-06-24 MED ORDER — TRANEXAMIC ACID (OHS) PUMP PRIME SOLUTION
2.0000 mg/kg | INTRAVENOUS | Status: DC
  Filled 2017-06-24: qty 2.06

## 2017-06-24 MED ORDER — PROPOFOL 10 MG/ML IV BOLUS
INTRAVENOUS | Status: AC
  Filled 2017-06-24: qty 20

## 2017-06-24 MED ORDER — DOPAMINE-DEXTROSE 3.2-5 MG/ML-% IV SOLN
0.0000 ug/kg/min | INTRAVENOUS | Status: DC
  Filled 2017-06-24: qty 250

## 2017-06-24 MED ORDER — GELATIN ABSORBABLE MT POWD
OROMUCOSAL | Status: DC | PRN
  Administered 2017-06-24 (×3): 4 mL via TOPICAL

## 2017-06-24 MED ORDER — PANTOPRAZOLE SODIUM 40 MG PO TBEC
40.0000 mg | DELAYED_RELEASE_TABLET | Freq: Every day | ORAL | Status: DC
  Administered 2017-06-26 – 2017-06-28 (×3): 40 mg via ORAL
  Filled 2017-06-24 (×3): qty 1

## 2017-06-24 MED ORDER — SODIUM CHLORIDE 0.9 % IV SOLN
INTRAVENOUS | Status: DC
  Filled 2017-06-24: qty 30

## 2017-06-24 MED ORDER — ORAL CARE MOUTH RINSE
15.0000 mL | Freq: Two times a day (BID) | OROMUCOSAL | Status: DC
  Administered 2017-06-25 (×2): 15 mL via OROMUCOSAL

## 2017-06-24 MED ORDER — LACTATED RINGERS IV SOLN
INTRAVENOUS | Status: DC | PRN
  Administered 2017-06-24: 11:00:00 via INTRAVENOUS

## 2017-06-24 MED ORDER — SODIUM CHLORIDE 0.9 % IV SOLN
INTRAVENOUS | Status: AC
  Administered 2017-06-24: 1000 mL
  Filled 2017-06-24: qty 1000

## 2017-06-24 MED ORDER — HEPARIN SODIUM (PORCINE) 1000 UNIT/ML IJ SOLN
4000.0000 [IU] | Freq: Once | INTRAMUSCULAR | Status: DC
  Filled 2017-06-24: qty 4

## 2017-06-24 MED ORDER — TRANEXAMIC ACID (OHS) BOLUS VIA INFUSION
15.0000 mg/kg | INTRAVENOUS | Status: DC
  Administered 2017-06-24: 1545 mg via INTRAVENOUS
  Filled 2017-06-24: qty 1545

## 2017-06-24 MED ORDER — INSULIN REGULAR BOLUS VIA INFUSION
0.0000 [IU] | Freq: Three times a day (TID) | INTRAVENOUS | Status: DC
  Filled 2017-06-24: qty 10

## 2017-06-24 MED ORDER — PLASMA-LYTE 148 IV SOLN
INTRAVENOUS | Status: AC
  Administered 2017-06-24: 500 mL
  Filled 2017-06-24: qty 2.5

## 2017-06-24 MED ORDER — HEPARIN (PORCINE) IN NACL 2-0.9 UNIT/ML-% IJ SOLN
INTRAMUSCULAR | Status: AC | PRN
  Administered 2017-06-24: 1000 mL via INTRA_ARTERIAL

## 2017-06-24 MED ORDER — SODIUM CHLORIDE 0.9 % IV SOLN
30.0000 ug/min | INTRAVENOUS | Status: DC
  Administered 2017-06-24: 50 ug/min via INTRAVENOUS
  Filled 2017-06-24: qty 2

## 2017-06-24 MED ORDER — SODIUM CHLORIDE 0.9 % IV SOLN
INTRAVENOUS | Status: DC
  Administered 2017-06-24: 18:00:00 via INTRAVENOUS

## 2017-06-24 MED ORDER — FENTANYL CITRATE (PF) 100 MCG/2ML IJ SOLN
INTRAMUSCULAR | Status: AC
  Filled 2017-06-24: qty 2

## 2017-06-24 MED ORDER — HEPARIN SODIUM (PORCINE) 5000 UNIT/ML IJ SOLN
INTRAMUSCULAR | Status: AC
  Filled 2017-06-24: qty 1

## 2017-06-24 MED ORDER — ROCURONIUM BROMIDE 100 MG/10ML IV SOLN
INTRAVENOUS | Status: DC | PRN
  Administered 2017-06-24: 60 mg via INTRAVENOUS
  Administered 2017-06-24: 40 mg via INTRAVENOUS
  Administered 2017-06-24: 50 mg via INTRAVENOUS

## 2017-06-24 MED ORDER — VERAPAMIL HCL 2.5 MG/ML IV SOLN
INTRAVENOUS | Status: AC
  Filled 2017-06-24: qty 2

## 2017-06-24 MED ORDER — LIDOCAINE HCL (PF) 1 % IJ SOLN
INTRAMUSCULAR | Status: DC | PRN
  Administered 2017-06-24: 2 mL

## 2017-06-24 MED ORDER — HEPARIN SODIUM (PORCINE) 1000 UNIT/ML IJ SOLN
INTRAMUSCULAR | Status: DC | PRN
  Administered 2017-06-24: 27000 [IU] via INTRAVENOUS
  Administered 2017-06-24: 3000 [IU] via INTRAVENOUS

## 2017-06-24 MED ORDER — IOPAMIDOL (ISOVUE-370) INJECTION 76%
INTRAVENOUS | Status: DC | PRN
Start: 2017-06-24 — End: 2017-06-24
  Administered 2017-06-24: 80 mL via INTRA_ARTERIAL

## 2017-06-24 MED ORDER — CHLORHEXIDINE GLUCONATE 0.12% ORAL RINSE (MEDLINE KIT)
15.0000 mL | Freq: Two times a day (BID) | OROMUCOSAL | Status: DC
  Administered 2017-06-24: 15 mL via OROMUCOSAL

## 2017-06-24 MED ORDER — PROPOFOL 10 MG/ML IV BOLUS
INTRAVENOUS | Status: DC | PRN
  Administered 2017-06-24: 100 mg via INTRAVENOUS

## 2017-06-24 MED ORDER — METOPROLOL TARTRATE 5 MG/5ML IV SOLN: 2.5000 mg | INTRAVENOUS | Status: DC | PRN

## 2017-06-24 MED ORDER — EPINEPHRINE PF 1 MG/ML IJ SOLN
0.0000 ug/min | INTRAVENOUS | Status: DC
  Filled 2017-06-24: qty 4

## 2017-06-24 MED ORDER — SODIUM CHLORIDE 0.9 % IV SOLN
INTRAVENOUS | Status: DC
  Administered 2017-06-24: 3.7 [IU]/h via INTRAVENOUS
  Filled 2017-06-24: qty 1

## 2017-06-24 MED ORDER — SODIUM CHLORIDE 0.9 % IV SOLN
0.0000 ug/min | INTRAVENOUS | Status: DC
  Administered 2017-06-25: 25 ug/min via INTRAVENOUS
  Filled 2017-06-24: qty 2

## 2017-06-24 MED ORDER — ASPIRIN 81 MG PO CHEW: 324.0000 mg | CHEWABLE_TABLET | Freq: Every day | ORAL | Status: DC

## 2017-06-24 MED ORDER — SODIUM CHLORIDE 0.9 % IV SOLN
INTRAVENOUS | Status: DC
  Filled 2017-06-24: qty 1

## 2017-06-24 MED ORDER — MIDAZOLAM HCL 2 MG/2ML IJ SOLN: 2.0000 mg | INTRAMUSCULAR | Status: DC | PRN

## 2017-06-24 MED ORDER — ACETAMINOPHEN 500 MG PO TABS
1000.0000 mg | ORAL_TABLET | Freq: Four times a day (QID) | ORAL | Status: DC
  Administered 2017-06-24 – 2017-06-28 (×15): 1000 mg via ORAL
  Filled 2017-06-24 (×14): qty 2

## 2017-06-24 MED ORDER — PHENYLEPHRINE 40 MCG/ML (10ML) SYRINGE FOR IV PUSH (FOR BLOOD PRESSURE SUPPORT)
PREFILLED_SYRINGE | INTRAVENOUS | Status: DC | PRN
  Administered 2017-06-24: 80 ug via INTRAVENOUS

## 2017-06-24 MED ORDER — POTASSIUM CHLORIDE 10 MEQ/50ML IV SOLN: 10.0000 meq | INTRAVENOUS | Status: AC

## 2017-06-24 MED ORDER — MORPHINE SULFATE (PF) 2 MG/ML IV SOLN: 1.0000 mg | INTRAVENOUS | Status: DC | PRN

## 2017-06-24 MED ORDER — MILRINONE LACTATE IN DEXTROSE 20-5 MG/100ML-% IV SOLN
0.1250 ug/kg/min | INTRAVENOUS | Status: DC
  Filled 2017-06-24: qty 100

## 2017-06-24 MED ORDER — LIDOCAINE 2% (20 MG/ML) 5 ML SYRINGE
INTRAMUSCULAR | Status: DC | PRN
  Administered 2017-06-24: 80 mg via INTRAVENOUS

## 2017-06-24 MED ORDER — LACTATED RINGERS IV SOLN
INTRAVENOUS | Status: DC
  Administered 2017-06-25: 03:00:00 via INTRAVENOUS

## 2017-06-24 MED ORDER — VERAPAMIL HCL 2.5 MG/ML IV SOLN
INTRAVENOUS | Status: DC | PRN
  Administered 2017-06-24: 10:00:00 via INTRA_ARTERIAL

## 2017-06-24 MED ORDER — EPHEDRINE SULFATE 50 MG/ML IJ SOLN
INTRAMUSCULAR | Status: DC | PRN
  Administered 2017-06-24 (×4): 10 mg via INTRAVENOUS

## 2017-06-24 MED ORDER — SODIUM CHLORIDE 0.9% FLUSH: 3.0000 mL | INTRAVENOUS | Status: DC | PRN

## 2017-06-24 MED ORDER — FENTANYL CITRATE (PF) 100 MCG/2ML IJ SOLN
INTRAMUSCULAR | Status: DC | PRN
  Administered 2017-06-24: 25 ug via INTRAVENOUS

## 2017-06-24 MED ORDER — ACETAMINOPHEN 160 MG/5ML PO SOLN: 1000.0000 mg | Freq: Four times a day (QID) | ORAL | Status: DC

## 2017-06-24 MED ORDER — MAGNESIUM SULFATE 50 % IJ SOLN
40.0000 meq | INTRAMUSCULAR | Status: DC
  Filled 2017-06-24: qty 9.85

## 2017-06-24 MED ORDER — SODIUM CHLORIDE 0.9 % IV SOLN
0.0000 ug/kg/h | INTRAVENOUS | Status: DC
  Administered 2017-06-24: 0.7 ug/kg/h via INTRAVENOUS
  Administered 2017-06-24: 0.5 ug/kg/h via INTRAVENOUS
  Filled 2017-06-24: qty 2

## 2017-06-24 MED ORDER — TRANEXAMIC ACID 1000 MG/10ML IV SOLN
1.5000 mg/kg/h | INTRAVENOUS | Status: DC
  Filled 2017-06-24: qty 25

## 2017-06-24 MED ORDER — ONDANSETRON HCL 4 MG/2ML IJ SOLN
INTRAMUSCULAR | Status: DC | PRN
  Administered 2017-06-24: 4 mg via INTRAVENOUS

## 2017-06-24 MED ORDER — MIDAZOLAM HCL 10 MG/2ML IJ SOLN
INTRAMUSCULAR | Status: AC
  Filled 2017-06-24: qty 2

## 2017-06-24 MED ORDER — METOPROLOL TARTRATE 25 MG/10 ML ORAL SUSPENSION: 12.5000 mg | Freq: Two times a day (BID) | ORAL | Status: DC

## 2017-06-24 MED ORDER — NITROGLYCERIN IN D5W 200-5 MCG/ML-% IV SOLN: 0.0000 ug/min | INTRAVENOUS | Status: DC

## 2017-06-24 MED ORDER — IOPAMIDOL (ISOVUE-370) INJECTION 76%
INTRAVENOUS | Status: AC
  Filled 2017-06-24: qty 100

## 2017-06-24 MED ORDER — DEXMEDETOMIDINE HCL IN NACL 400 MCG/100ML IV SOLN
0.1000 ug/kg/h | INTRAVENOUS | Status: DC
  Filled 2017-06-24: qty 100

## 2017-06-24 MED ORDER — ASPIRIN EC 325 MG PO TBEC: 325.0000 mg | DELAYED_RELEASE_TABLET | Freq: Every day | ORAL | Status: DC

## 2017-06-24 MED ORDER — ONDANSETRON HCL 4 MG/2ML IJ SOLN
4.0000 mg | Freq: Four times a day (QID) | INTRAMUSCULAR | Status: DC | PRN
  Administered 2017-06-25: 4 mg via INTRAVENOUS
  Filled 2017-06-24: qty 2

## 2017-06-24 MED ORDER — SODIUM CHLORIDE 0.45 % IV SOLN
INTRAVENOUS | Status: DC | PRN
  Administered 2017-06-24: 17:00:00 via INTRAVENOUS

## 2017-06-24 MED ORDER — SODIUM CHLORIDE 0.9 % IV SOLN
INTRAVENOUS | Status: DC
  Administered 2017-06-24: 2 [IU]/h via INTRAVENOUS
  Filled 2017-06-24: qty 1

## 2017-06-24 MED ORDER — POTASSIUM CHLORIDE 2 MEQ/ML IV SOLN
80.0000 meq | INTRAVENOUS | Status: DC
  Filled 2017-06-24: qty 40

## 2017-06-24 MED ORDER — PHENYLEPHRINE HCL 10 MG/ML IJ SOLN
INTRAVENOUS | Status: DC | PRN
  Administered 2017-06-24: 25 ug/min via INTRAVENOUS

## 2017-06-24 MED ORDER — TRAMADOL HCL 50 MG PO TABS
50.0000 mg | ORAL_TABLET | ORAL | Status: DC | PRN
  Administered 2017-06-27: 100 mg via ORAL
  Filled 2017-06-24: qty 2

## 2017-06-24 MED ORDER — LACTATED RINGERS IV SOLN
INTRAVENOUS | Status: DC | PRN
  Administered 2017-06-24: 12:00:00 via INTRAVENOUS

## 2017-06-24 MED ORDER — DEXMEDETOMIDINE HCL IN NACL 400 MCG/100ML IV SOLN
0.1000 ug/kg/h | INTRAVENOUS | Status: DC
  Administered 2017-06-24: .5 ug/kg/h via INTRAVENOUS
  Filled 2017-06-24: qty 100

## 2017-06-24 MED ORDER — MIDAZOLAM HCL 2 MG/2ML IJ SOLN
INTRAMUSCULAR | Status: AC
  Filled 2017-06-24: qty 2

## 2017-06-24 MED ORDER — DEXTROSE 5 % IV SOLN
1.5000 g | INTRAVENOUS | Status: DC
  Administered 2017-06-24: 1.5 g via INTRAVENOUS
  Administered 2017-06-24: .75 g via INTRAVENOUS
  Filled 2017-06-24: qty 1.5

## 2017-06-24 MED ORDER — CHLORHEXIDINE GLUCONATE 0.12 % MT SOLN
15.0000 mL | OROMUCOSAL | Status: AC
  Administered 2017-06-24: 15 mL via OROMUCOSAL

## 2017-06-24 MED ORDER — MIDAZOLAM HCL 5 MG/5ML IJ SOLN
INTRAMUSCULAR | Status: DC | PRN
  Administered 2017-06-24: 3 mg via INTRAVENOUS

## 2017-06-24 MED ORDER — MORPHINE SULFATE (PF) 4 MG/ML IV SOLN
1.0000 mg | INTRAVENOUS | Status: DC | PRN
  Administered 2017-06-24 – 2017-06-25 (×7): 2 mg via INTRAVENOUS
  Filled 2017-06-24 (×7): qty 1

## 2017-06-24 MED ORDER — PROTAMINE SULFATE 10 MG/ML IV SOLN
INTRAVENOUS | Status: DC | PRN
  Administered 2017-06-24: 30 mg via INTRAVENOUS

## 2017-06-24 MED ORDER — MORPHINE SULFATE (PF) 4 MG/ML IV SOLN
1.0000 mg | INTRAVENOUS | Status: DC | PRN
  Administered 2017-06-24: 2 mg via INTRAVENOUS
  Filled 2017-06-24: qty 1

## 2017-06-24 MED ORDER — ORAL CARE MOUTH RINSE: 15.0000 mL | Freq: Four times a day (QID) | OROMUCOSAL | Status: DC

## 2017-06-24 MED ORDER — OXYCODONE HCL 5 MG PO TABS
5.0000 mg | ORAL_TABLET | ORAL | Status: DC | PRN
  Administered 2017-06-25 (×3): 10 mg via ORAL
  Filled 2017-06-24 (×3): qty 2

## 2017-06-24 MED ORDER — SODIUM CHLORIDE 0.9% FLUSH
3.0000 mL | Freq: Two times a day (BID) | INTRAVENOUS | Status: DC
  Administered 2017-06-25 (×2): 3 mL via INTRAVENOUS
  Administered 2017-06-26: 6 mL via INTRAVENOUS
  Administered 2017-06-26 – 2017-06-27 (×2): 3 mL via INTRAVENOUS
  Administered 2017-06-27: 6 mL via INTRAVENOUS
  Administered 2017-06-28: 3 mL via INTRAVENOUS

## 2017-06-24 MED ORDER — BISACODYL 5 MG PO TBEC
10.0000 mg | DELAYED_RELEASE_TABLET | Freq: Every day | ORAL | Status: DC
  Administered 2017-06-25 – 2017-06-27 (×3): 10 mg via ORAL
  Filled 2017-06-24 (×4): qty 2

## 2017-06-24 MED ORDER — DOCUSATE SODIUM 100 MG PO CAPS
200.0000 mg | ORAL_CAPSULE | Freq: Every day | ORAL | Status: DC
  Administered 2017-06-25 – 2017-06-28 (×4): 200 mg via ORAL
  Filled 2017-06-24 (×4): qty 2

## 2017-06-24 MED ORDER — PROTAMINE SULFATE 10 MG/ML IV SOLN
INTRAVENOUS | Status: AC
  Filled 2017-06-24: qty 25

## 2017-06-24 MED ORDER — ASPIRIN 325 MG PO TABS
ORAL_TABLET | ORAL | Status: DC | PRN
  Administered 2017-06-24: 325 mg via ORAL

## 2017-06-24 MED ORDER — BISACODYL 10 MG RE SUPP: 10.0000 mg | Freq: Every day | RECTAL | Status: DC

## 2017-06-24 MED ORDER — FENTANYL CITRATE (PF) 250 MCG/5ML IJ SOLN
INTRAMUSCULAR | Status: AC
Start: 2017-06-24 — End: ?
  Filled 2017-06-24: qty 5

## 2017-06-24 MED ORDER — VANCOMYCIN HCL 10 G IV SOLR
1500.0000 mg | INTRAVENOUS | Status: DC
  Filled 2017-06-24: qty 1500

## 2017-06-24 MED ORDER — ARTIFICIAL TEARS OPHTHALMIC OINT
TOPICAL_OINTMENT | OPHTHALMIC | Status: DC | PRN
  Administered 2017-06-24: 1 via OPHTHALMIC

## 2017-06-24 MED ORDER — METOPROLOL TARTRATE 12.5 MG HALF TABLET: 12.5000 mg | ORAL_TABLET | Freq: Two times a day (BID) | ORAL | Status: DC

## 2017-06-24 MED ORDER — DEXTROSE 5 % IV SOLN
1.5000 g | INTRAVENOUS | Status: DC
  Filled 2017-06-24: qty 1.5

## 2017-06-24 MED ORDER — LACTATED RINGERS IV SOLN: INTRAVENOUS | Status: DC

## 2017-06-24 MED ORDER — ACETAMINOPHEN 160 MG/5ML PO SOLN: 650.0000 mg | Freq: Once | ORAL | Status: AC

## 2017-06-24 SURGICAL SUPPLY — 120 items
ADAPTER CARDIO PERF ANTE/RETRO (ADAPTER) ×3 IMPLANT
ARTICLIP LAA PROCLIP II 40 (Clip) ×3 IMPLANT
BAG DECANTER FOR FLEXI CONT (MISCELLANEOUS) ×6 IMPLANT
BANDAGE ACE 4X5 VEL STRL LF (GAUZE/BANDAGES/DRESSINGS) ×3 IMPLANT
BANDAGE ACE 6X5 VEL STRL LF (GAUZE/BANDAGES/DRESSINGS) ×3 IMPLANT
BASKET HEART (ORDER IN 25'S) (MISCELLANEOUS) ×1
BASKET HEART (ORDER IN 25S) (MISCELLANEOUS) ×2 IMPLANT
BLADE CLIPPER SURG (BLADE) IMPLANT
BLADE MINI RND TIP GREEN BEAV (BLADE) ×3 IMPLANT
BLADE NEEDLE 3 SS STRL (BLADE) ×3 IMPLANT
BLADE STERNUM SYSTEM 6 (BLADE) ×6 IMPLANT
BLADE SURG 11 STRL SS (BLADE) ×3 IMPLANT
BNDG GAUZE ELAST 4 BULKY (GAUZE/BANDAGES/DRESSINGS) ×3 IMPLANT
CANISTER SUCT 3000ML PPV (MISCELLANEOUS) ×3 IMPLANT
CANNULA EZ GLIDE AORTIC 21FR (CANNULA) ×6 IMPLANT
CANNULA GUNDRY RCSP 15FR (MISCELLANEOUS) ×3 IMPLANT
CANNULA SUMP PERICARDIAL (CANNULA) ×3 IMPLANT
CANNULA VENNOUS METAL TIP 20FR (CANNULA) ×3 IMPLANT
CATH CPB KIT OWEN (MISCELLANEOUS) ×3 IMPLANT
CATH THORACIC 36FR (CATHETERS) ×3 IMPLANT
CLAMP OLL ABLATION (MISCELLANEOUS) ×3 IMPLANT
CLIP RETRACTION 3.0MM CORONARY (MISCELLANEOUS) ×3 IMPLANT
CLIP VESOCCLUDE MED 24/CT (CLIP) IMPLANT
CLIP VESOCCLUDE SM WIDE 24/CT (CLIP) ×6 IMPLANT
CONN 3/8X3/8 GISH STERILE (MISCELLANEOUS) ×6 IMPLANT
CONN ST 1/4X3/8  BEN (MISCELLANEOUS) ×3
CONN ST 1/4X3/8 BEN (MISCELLANEOUS) ×6 IMPLANT
CONNECTOR 1/2X3/8X1/2 3 WAY (MISCELLANEOUS) ×1
CONNECTOR 1/2X3/8X1/2 3WAY (MISCELLANEOUS) ×2 IMPLANT
CONT SPEC 4OZ CLIKSEAL STRL BL (MISCELLANEOUS) ×9 IMPLANT
COVER PROBE W GEL 5X96 (DRAPES) ×3 IMPLANT
CRADLE DONUT ADULT HEAD (MISCELLANEOUS) ×3 IMPLANT
DERMABOND ADVANCED (GAUZE/BANDAGES/DRESSINGS) ×1
DERMABOND ADVANCED .7 DNX12 (GAUZE/BANDAGES/DRESSINGS) ×2 IMPLANT
DEVICE ATRICLIP LAA PRCLPII 40 (Clip) ×2 IMPLANT
DRAIN CHANNEL 32F RND 10.7 FF (WOUND CARE) ×9 IMPLANT
DRAPE CARDIOVASCULAR INCISE (DRAPES) ×1
DRAPE INCISE IOBAN 66X45 STRL (DRAPES) ×6 IMPLANT
DRAPE SLUSH/WARMER DISC (DRAPES) ×3 IMPLANT
DRAPE SRG 135X102X78XABS (DRAPES) ×2 IMPLANT
DRSG AQUACEL AG ADV 3.5X14 (GAUZE/BANDAGES/DRESSINGS) ×3 IMPLANT
DRSG COVADERM 4X14 (GAUZE/BANDAGES/DRESSINGS) ×3 IMPLANT
ELECT BLADE 4.0 EZ CLEAN MEGAD (MISCELLANEOUS) ×3
ELECT REM PT RETURN 9FT ADLT (ELECTROSURGICAL) ×6
ELECTRODE BLDE 4.0 EZ CLN MEGD (MISCELLANEOUS) ×2 IMPLANT
ELECTRODE REM PT RTRN 9FT ADLT (ELECTROSURGICAL) ×4 IMPLANT
FELT TEFLON 1X6 (MISCELLANEOUS) ×9 IMPLANT
GAUZE SPONGE 4X4 12PLY STRL (GAUZE/BANDAGES/DRESSINGS) ×6 IMPLANT
GAUZE SPONGE 4X4 12PLY STRL LF (GAUZE/BANDAGES/DRESSINGS) ×6 IMPLANT
GLOVE INDICATOR 6.5 STRL GRN (GLOVE) ×30 IMPLANT
GLOVE ORTHO TXT STRL SZ7.5 (GLOVE) ×15 IMPLANT
GOWN STRL REUS W/ TWL LRG LVL3 (GOWN DISPOSABLE) ×8 IMPLANT
GOWN STRL REUS W/TWL LRG LVL3 (GOWN DISPOSABLE) ×4
HEMOSTAT POWDER SURGIFOAM 1G (HEMOSTASIS) ×9 IMPLANT
INSERT FOGARTY XLG (MISCELLANEOUS) ×3 IMPLANT
KIT BASIN OR (CUSTOM PROCEDURE TRAY) ×3 IMPLANT
KIT DILATOR VASC 18G NDL (KITS) ×3 IMPLANT
KIT DRAINAGE VACCUM ASSIST (KITS) ×3 IMPLANT
KIT ROOM TURNOVER OR (KITS) ×3 IMPLANT
KIT SUCTION CATH 14FR (SUCTIONS) ×18 IMPLANT
KIT VASOVIEW HEMOPRO VH 3000 (KITS) ×3 IMPLANT
LEAD PACING MYOCARDI (MISCELLANEOUS) ×3 IMPLANT
LINE VENT (MISCELLANEOUS) ×6 IMPLANT
LOOP VESSEL SUPERMAXI WHITE (MISCELLANEOUS) ×3 IMPLANT
MARKER GRAFT CORONARY BYPASS (MISCELLANEOUS) ×12 IMPLANT
NS IRRIG 1000ML POUR BTL (IV SOLUTION) ×15 IMPLANT
PACK E OPEN HEART (SUTURE) ×3 IMPLANT
PACK OPEN HEART (CUSTOM PROCEDURE TRAY) ×3 IMPLANT
PAD ARMBOARD 7.5X6 YLW CONV (MISCELLANEOUS) ×6 IMPLANT
PAD CARDIAC INSULATION (MISCELLANEOUS) ×3 IMPLANT
PAD ELECT DEFIB RADIOL ZOLL (MISCELLANEOUS) ×3 IMPLANT
PENCIL BUTTON HOLSTER BLD 10FT (ELECTRODE) ×3 IMPLANT
PROBE CRYO2-ABLATION MALLABLE (MISCELLANEOUS) ×3 IMPLANT
PUNCH AORTIC ROT 4.0MM RCL 40 (MISCELLANEOUS) ×3 IMPLANT
PUNCH AORTIC ROTATE 4.0MM (MISCELLANEOUS) IMPLANT
PUNCH AORTIC ROTATE 4.5MM 8IN (MISCELLANEOUS) IMPLANT
PUNCH AORTIC ROTATE 5MM 8IN (MISCELLANEOUS) IMPLANT
SET CARDIOPLEGIA MPS 5001102 (MISCELLANEOUS) ×3 IMPLANT
SOLUTION ANTI FOG 6CC (MISCELLANEOUS) IMPLANT
SPONGE LAP 18X18 X RAY DECT (DISPOSABLE) ×3 IMPLANT
SPONGE LAP 4X18 X RAY DECT (DISPOSABLE) ×3 IMPLANT
SUT BONE WAX W31G (SUTURE) ×3 IMPLANT
SUT ETHIBOND X763 2 0 SH 1 (SUTURE) ×6 IMPLANT
SUT MNCRL AB 3-0 PS2 18 (SUTURE) ×6 IMPLANT
SUT MNCRL AB 4-0 PS2 18 (SUTURE) ×3 IMPLANT
SUT PDS AB 1 CTX 36 (SUTURE) ×6 IMPLANT
SUT PROLENE 2 0 SH DA (SUTURE) IMPLANT
SUT PROLENE 3 0 SH DA (SUTURE) ×9 IMPLANT
SUT PROLENE 3 0 SH1 36 (SUTURE) ×3 IMPLANT
SUT PROLENE 4 0 RB 1 (SUTURE)
SUT PROLENE 4 0 SH DA (SUTURE) ×3 IMPLANT
SUT PROLENE 4-0 RB1 .5 CRCL 36 (SUTURE) IMPLANT
SUT PROLENE 5 0 C 1 36 (SUTURE) IMPLANT
SUT PROLENE 6 0 C 1 30 (SUTURE) IMPLANT
SUT PROLENE 7.0 RB 3 (SUTURE) ×9 IMPLANT
SUT PROLENE 8 0 BV175 6 (SUTURE) IMPLANT
SUT PROLENE BLUE 7 0 (SUTURE) ×6 IMPLANT
SUT PROLENE POLY MONO (SUTURE) IMPLANT
SUT SILK  1 MH (SUTURE) ×5
SUT SILK 1 MH (SUTURE) ×10 IMPLANT
SUT STEEL 6MS V (SUTURE) IMPLANT
SUT STEEL STERNAL CCS#1 18IN (SUTURE) IMPLANT
SUT STEEL SZ 6 DBL 3X14 BALL (SUTURE) IMPLANT
SUT VIC AB 1 CTX 36 (SUTURE) ×1
SUT VIC AB 1 CTX36XBRD ANBCTR (SUTURE) ×2 IMPLANT
SUT VIC AB 2-0 CT1 27 (SUTURE) ×1
SUT VIC AB 2-0 CT1 TAPERPNT 27 (SUTURE) ×2 IMPLANT
SUT VIC AB 2-0 CTX 27 (SUTURE) IMPLANT
SUT VIC AB 3-0 SH 27 (SUTURE)
SUT VIC AB 3-0 SH 27X BRD (SUTURE) IMPLANT
SUT VIC AB 3-0 X1 27 (SUTURE) IMPLANT
SUT VICRYL 4-0 PS2 18IN ABS (SUTURE) IMPLANT
SYSTEM SAHARA CHEST DRAIN ATS (WOUND CARE) ×3 IMPLANT
TAPE CLOTH SURG 6X10 WHT LF (GAUZE/BANDAGES/DRESSINGS) ×3 IMPLANT
TOWEL GREEN STERILE (TOWEL DISPOSABLE) ×3 IMPLANT
TOWEL GREEN STERILE FF (TOWEL DISPOSABLE) ×3 IMPLANT
TRAY FOLEY SILVER 16FR TEMP (SET/KITS/TRAYS/PACK) ×3 IMPLANT
TUBING INSUFFLATION (TUBING) ×3 IMPLANT
UNDERPAD 30X30 (UNDERPADS AND DIAPERS) ×3 IMPLANT
WATER STERILE IRR 1000ML POUR (IV SOLUTION) ×6 IMPLANT

## 2017-06-24 SURGICAL SUPPLY — 18 items
BALLN IABP SENSA PLUS 7.5F 40C (BALLOONS) ×2
BALLOON IABP SENS PLUS 7.5F40C (BALLOONS) ×1 IMPLANT
CATH 5FR JL3.5 JR4 ANG PIG MP (CATHETERS) ×2 IMPLANT
CATH LAUNCHER 5F 3DRIGHT (CATHETERS) ×1 IMPLANT
CATHETER LAUNCHER 5F 3DRIGHT (CATHETERS) ×2
COVER PRB 48X5XTLSCP FOLD TPE (BAG) ×2 IMPLANT
COVER PROBE 5X48 (BAG) ×2
DEVICE RAD COMP TR BAND LRG (VASCULAR PRODUCTS) IMPLANT
GLIDESHEATH SLEND SS 6F .021 (SHEATH) ×2 IMPLANT
GUIDEWIRE INQWIRE 1.5J.035X260 (WIRE) ×1 IMPLANT
INQWIRE 1.5J .035X260CM (WIRE) ×2
KIT ENCORE 26 ADVANTAGE (KITS) ×2 IMPLANT
KIT HEART LEFT (KITS) ×2 IMPLANT
PACK CARDIAC CATHETERIZATION (CUSTOM PROCEDURE TRAY) ×2 IMPLANT
SHEATH PINNACLE 6F 10CM (SHEATH) ×2 IMPLANT
TRANSDUCER W/STOPCOCK (MISCELLANEOUS) ×2 IMPLANT
TUBING CIL FLEX 10 FLL-RA (TUBING) ×2 IMPLANT
WIRE EMERALD 3MM-J .035X150CM (WIRE) ×2 IMPLANT

## 2017-06-24 NOTE — Anesthesia Procedure Notes (Signed)
Procedure Name: Intubation Date/Time: 06/24/2017 11:11 AM Performed by: Orlie Dakin, CRNA Pre-anesthesia Checklist: Patient identified, Suction available, Emergency Drugs available and Patient being monitored Patient Re-evaluated:Patient Re-evaluated prior to induction Oxygen Delivery Method: Circle system utilized Preoxygenation: Pre-oxygenation with 100% oxygen Induction Type: IV induction Ventilation: Mask ventilation without difficulty Laryngoscope Size: Mac and 4 Grade View: Grade I Tube type: Oral Tube size: 8.0 mm Number of attempts: 1 Airway Equipment and Method: Stylet Placement Confirmation: ETT inserted through vocal cords under direct vision,  breath sounds checked- equal and bilateral and positive ETCO2 Secured at: 24 cm Tube secured with: Tape Dental Injury: Teeth and Oropharynx as per pre-operative assessment

## 2017-06-24 NOTE — Anesthesia Postprocedure Evaluation (Signed)
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 utilizing left anterior mammary artery and right greater saphenous vein harvested endoscopically (N/A Chest) TRANSESOPHAGEAL ECHOCARDIOGRAM (TEE) (N/A ) CLIPPING OF ATRIAL APPENDAGE (N/A Chest) MAZE (N/A )     Patient location during evaluation: SICU Anesthesia Type: General Level of consciousness: sedated Pain management: pain level controlled Vital Signs Assessment: post-procedure vital signs reviewed and stable Respiratory status: patient remains intubated per anesthesia plan Cardiovascular status: stable Postop Assessment: no apparent nausea or vomiting Anesthetic complications: no    Last Vitals:  Vitals:   06/24/17 1031 06/24/17 1650  BP: (!) 103/58 (!) 114/57  Pulse: 62 80  Resp: 12 20  SpO2: 100% 98%    Last Pain:  Vitals:   06/24/17 0910  PainSc: 2                  Brayla Pat P Deauna Yaw

## 2017-06-24 NOTE — Progress Notes (Signed)
Patient walked into our waiting room stating having severe chest pain, short of breath, diaphoresis and pale. Pt wife stated he vomited after taking his medication this morning. Chest pain started around 330am this morning. Patient was transported to ER via wheelchair as pt appeared too unstable for outpt office visit per [name], NP

## 2017-06-24 NOTE — Consult Note (Signed)
[address]       [place] [address]             [phone]          CARDIOTHORACIC SURGERY CONSULTATION REPORT  PCP is Lavone Orn, MD Referring Provider is Martinique, Peter, MD Primary Cardiologist is End, Harrell Gave, MD   Reason for consultation:  Critical left main coronary artery disease with acute coronary syndrome  HPI:  Patient is a 72 year old male with history of atrial fibrillation and hyperlipidemia who has been referred for emergent surgical consultation due to the discovery of critical left main coronary artery disease in the setting of acute coronary syndrome and possible acute evolving ST segment elevation myocardial infarction.  The patient's cardiac history dates back to July 2018 when he was initially evaluated by Dr. and for symptoms of chest pain.  Symptoms were reported as episodic and not necessarily associated with activity.  The patient did describe associated symptoms of exertional shortness of breath.  He was noted to be in atrial fibrillation at the time.  A stress test and echocardiogram were performed.  Nuclear medicine stress test was felt to be low risk.  Echocardiogram revealed normal left ventricular systolic function.  The patient was referred to the atrial fibrillation clinic and has been followed there intermittently ever since.  He has recently been anticoagulated using Xarelto.  He previously was treated with flecainide but failed to maintain sinus rhythm.  Last week he was hospitalized for Tikosyn loading.  He initially converted to sinus rhythm but had recurrent A. fib.  He was taken for DC cardioversion but spontaneously converted to sinus rhythm with induction of anesthesia and cardioversion was canceled.  The patient was discharged home.  Early this morning he woke from his sleep having crushing substernal chest pain.  He presented to the emergency room where he was initially in atrial fibrillation.  In the emergency department  he spontaneously converted to sinus rhythm but had ST segment depression concerning for possible acute myocardial ischemia.  He was brought directly to the cardiac Cath Lab where he was found to have critical left main coronary artery disease.  Intra-aortic balloon pump was placed and emergent cardiothoracic surgical consultation requested.  Patient is married and lives locally in [place] with his wife.  In the past he exercise regularly, including running on a frequent basis.  He used to participate in marathons and half marathons.  Over the past 6 months he has developed progressive symptoms of exertional shortness of breath.  He now gets short of breath with moderate and relatively low level activity.  He denies resting shortness of breath, PND, orthopnea, or lower extremity edema.  He developed severe crushing substernal chest pain whenever he goes out of sinus rhythm.  He has had several episodes over the last week, and early this morning he had a particularly severe and prolonged episode of associated chest pain which prompted hospital admission.  At present the patient is lying supine on the Cath Lab table with intra-aortic balloon pump in place.  He is in sinus rhythm and denies ongoing chest pain.  Past Medical History:  Diagnosis Date  . Chest pain    with nagative cardiolite  . Gout    occational flare  . Hyperlipemia   . Persistent atrial fibrillation ([place])   . Peyronie disease   . Visit for monitoring Tikosyn therapy 06/18/2017    Past Surgical History:  Procedure Laterality Date  . bone  graph    . CARDIOVERSION N/A 03/25/2017   Procedure: CARDIOVERSION;  Surgeon: Sueanne Margarita, MD;  Location: [facility] ENDOSCOPY;  Service: Cardiovascular;  Laterality: N/A;  . 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 Liver cancer Mother 52  . Esophageal cancer Father 70  . Healthy Daughter   . Heart disease Neg Hx     Social History    Socioeconomic History  . Marital status: Married    Spouse name: Not on file  . Number of children: Not on file  . Years of education: Not on file  . Highest education level: Not on file  Social Needs  . Financial resource strain: Not on file  . Food insecurity - worry: Not on file  . Food insecurity - inability: Not on file  . Transportation needs - medical: Not on file  . Transportation needs - non-medical: Not on file  Occupational History  . Not on file  Tobacco Use  . Smoking status: Never Smoker  . Smokeless tobacco: Never Used  Substance and Sexual Activity  . Alcohol use: Yes    Comment: 1 beer per month  . Drug use: No  . Sexual activity: Not on file  Other Topics Concern  . 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 Authorizing Provider  Cyanocobalamin (VITAMIN B-12 PO) Take 1 tablet by mouth 2 (two) times a week.    [provider]  dofetilide (TIKOSYN) 500 MCG capsule Take 1 capsule (500 mcg total) by mouth 2 (two) times daily. 06/21/17   Barrett, Evelene Croon, PA-C  ECHINACEA PO Take 1 capsule by mouth 2 (two) times daily as needed (for cold symptoms).     [provider]  ELDERBERRY PO Take 15 mLs by mouth daily as needed (for cold symptoms).     [provider]  metoprolol succinate (TOPROL XL) 25 MG 24 hr tablet Take 1 tablet (25 mg total) by mouth daily. Ok to take an extra 1/2 tablet prn for afib 06/21/17   Barrett, Evelene Croon, PA-C  NON FORMULARY [place] liquid: Take 5 ml's by mouth once a day as needed for coughing    [provider]  rivaroxaban (XARELTO) 20 MG TABS tablet Take 1 tablet (20 mg total) by mouth daily with supper. Patient taking differently: Take 20 mg by mouth daily.  02/08/17   [name] T, PA-C  vitamin C (ASCORBIC ACID) 500 MG tablet Take 500 mg by mouth 2 (two) times a  week.     [provider]  vitamin E 400 UNIT capsule Take 400 Units by mouth 2 (two) times a week.     [provider]    Current Facility-Administered Medications  Medication Dose Route Frequency Provider Last Rate Last Dose  . cefUROXime (ZINACEF) 1.5 g in dextrose 5 % 50 mL IVPB  1.5 g Intravenous To OR Rexene Alberts, MD      . cefUROXime (ZINACEF) 750 mg in dextrose 5 % 50 mL IVPB  750 mg Intravenous To OR Rexene Alberts, MD      . dexmedetomidine (PRECEDEX) 400 MCG/100ML (4 mcg/mL) infusion  0.1-0.7 mcg/kg/hr Intravenous To OR Rexene Alberts, MD      .  DOPamine (INTROPIN) 800 mg in dextrose 5 % 250 mL (3.2 mg/mL) infusion  0-10 mcg/kg/min Intravenous To OR Rexene Alberts, MD      . EPINEPHrine (ADRENALIN) 4 mg in dextrose 5 % 250 mL (0.016 mg/mL) infusion  0-10 mcg/min Intravenous To OR Rexene Alberts, MD      . heparin 2,500 Units, papaverine 30 mg in electrolyte-148 (PLASMALYTE-148) 500 mL irrigation   Irrigation To OR Rexene Alberts, MD      . heparin 30,000 units/NS 1000 mL solution for CELLSAVER   Other To OR Rexene Alberts, MD      . heparin 5000 UNIT/ML injection           . [MAR Hold] heparin injection 4,000 Units  4,000 Units Intravenous Once Tegeler, Gwenyth Allegra, MD      . insulin regular (NOVOLIN R,HUMULIN R) 100 Units in sodium chloride 0.9 % 100 mL (1 Units/mL) infusion   Intravenous To OR Rexene Alberts, MD      . magnesium sulfate (IV Push/IM) injection 40 mEq  40 mEq Other To OR Rexene Alberts, MD      . milrinone (PRIMACOR) 20 MG/100 ML (0.2 mg/mL) infusion  0.125 mcg/kg/min Intravenous To OR Rexene Alberts, MD      . nitroGLYCERIN 50 mg in dextrose 5 % 250 mL (0.2 mg/mL) infusion  2-200 mcg/min Intravenous To OR Rexene Alberts, MD      . phenylephrine (NEO-SYNEPHRINE) 20 mg in sodium chloride 0.9 % 250 mL (0.08 mg/mL) infusion  30-200 mcg/min Intravenous To OR Rexene Alberts, MD      . potassium chloride injection 80 mEq  80 mEq  Other To OR Rexene Alberts, MD      . tranexamic acid (CYKLOKAPRON) 2,500 mg in sodium chloride 0.9 % 250 mL (10 mg/mL) infusion  1.5 mg/kg/hr Intravenous To OR Rexene Alberts, MD      . tranexamic acid (CYKLOKAPRON) bolus via infusion - over 30 minutes 1,545 mg  15 mg/kg Intravenous To OR Rexene Alberts, MD      . tranexamic acid (CYKLOKAPRON) pump prime solution 206 mg  2 mg/kg Intracatheter To OR Rexene Alberts, MD      . vancomycin (VANCOCIN) 1,000 mg in sodium chloride 0.9 % 1,000 mL irrigation   Irrigation To OR Rexene Alberts, MD      . vancomycin (VANCOCIN) 1,500 mg in sodium chloride 0.9 % 250 mL IVPB  1,500 mg Intravenous To OR Rexene Alberts, MD        No Known Allergies    Review of Systems:   General:  normal appetite, decreased energy, no weight gain, no weight loss, no fever  Cardiac:  + chest pain with exertion, + chest pain at rest, + SOB with exertion, NO resting SOB, NO PND, NO orthopnea, NO palpitations, + arrhythmia, + atrial fibrillation, no LE edema, no dizzy spells, no syncope  Respiratory:  + exertional shortness of breath, no home oxygen, no productive cough, no dry cough, no bronchitis, no wheezing, no hemoptysis, no asthma, no pain with inspiration or cough, no sleep apnea, no CPAP at night  GI:   no difficulty swallowing, no reflux, no frequent heartburn, no hiatal hernia, no abdominal pain, no constipation, no diarrhea, no hematochezia, no hematemesis, no melena  GU:   no dysuria,  no frequency, no urinary tract infection, no hematuria, no enlarged prostate, no kidney stones, no kidney disease  Vascular:  no  pain suggestive of claudication, no pain in feet, no leg cramps, no varicose veins, no DVT, no non-healing foot ulcer  Neuro:   no stroke, no TIA's, no seizures, no headaches, no temporary blindness one eye,  no slurred speech, no peripheral neuropathy, no chronic pain, no instability of gait, no memory/cognitive dysfunction  Musculoskeletal: no  arthritis, no joint swelling, no myalgias, no difficulty walking, normal mobility   Skin:   no rash, no itching, no skin infections, no pressure sores or ulcerations  Psych:   no anxiety, no depression, no nervousness, no unusual recent stress  Eyes:   no blurry vision, no floaters, no recent vision changes, does not wears glasses or contacts  ENT:   no hearing loss, no loose or painful teeth, no dentures, last saw dentist within the past year  Hematologic:  no easy bruising, no abnormal bleeding, no clotting disorder, no frequent epistaxis  Endocrine:  no diabetes, does not check CBG's at home     Physical Exam:   BP (!) 103/58   Pulse 62   Resp 12   SpO2 100%   General:  WNWN  Male in NAD, supine on cath lab table  HEENT:  Unremarkable   Neck:   no JVD, no bruits, no adenopathy   Chest:   clear to auscultation, symmetrical breath sounds, no wheezes, no rhonchi   CV:   RRR, no murmur   Abdomen:  soft, non-tender, no masses   Extremities:  warm, well-perfused, pulses palpable, no lower extremity edema  Rectal/GU  Deferred  Neuro:   Grossly non-focal and symmetrical throughout  Skin:   Clean and dry, no rashes, no breakdown  Diagnostic Tests:  Transthoracic Echocardiography  Patient:    Vraj, Denardo MR #:       [id number] Study Date: 12/20/2016 Gender:     M Age:        73 Height:     183.5 cm Weight:     102.5 kg BSA:        2.31 m^2 Pt. Status: Room:   ATTENDING    Jenkins Rouge, M.D.  ORDERING     Nelva Bush, MD  REFERRING    Nelva Bush, MD  SONOGRAPHER  Marygrace Drought, RCS  PERFORMING   [facility], Outpatient  cc:  ------------------------------------------------------------------- LV EF: 50% -   55%  ------------------------------------------------------------------- Indications:      Atrial Fibrillation (I48.0).  ------------------------------------------------------------------- History:   PMH:   Chest  pain.  ------------------------------------------------------------------- Study Conclusions  - Left ventricle: The cavity size was normal. Wall thickness was   normal. Systolic function was normal. The estimated ejection   fraction was in the range of 50% to 55%. The study is not   technically sufficient to allow evaluation of LV diastolic   function. - Mitral valve: There was mild regurgitation. - Atrial septum: No defect or patent foramen ovale was identified.  ------------------------------------------------------------------- Study data:  No prior study was available for comparison.  Study status:  Routine.  Procedure:  The patient reported no pain pre or post test. Transthoracic echocardiography. Image quality was adequate.          Transthoracic echocardiography.  M-mode, complete 2D, spectral Doppler, and color Doppler.  Birthdate: Patient birthdate: March 07, 1946.  Age:  Patient is 72 yr old.  Sex: Gender: male.    BMI: 30.4 kg/m^2.  Blood pressure:     90/64 Patient status:  Outpatient.  Study date:  Study date: 12/20/2016. Study time: 10:15 AM.  Location:  [facility]  -------------------------------------------------------------------  ------------------------------------------------------------------- Left ventricle:  The cavity size was normal. Wall thickness was normal. Systolic function was normal. The estimated ejection fraction was in the range of 50% to 55%. The study is not technically sufficient to allow evaluation of LV diastolic function.  ------------------------------------------------------------------- Aortic valve:   Trileaflet; mildly calcified leaflets.  Doppler: There was no stenosis.  ------------------------------------------------------------------- Mitral valve:   Mildly thickened leaflets .  Doppler:  There was mild regurgitation.    Valve area by pressure half-time: 8.8 cm^2. Indexed valve area by pressure half-time: 3.81  cm^2/m^2.  ------------------------------------------------------------------- Left atrium:  The atrium was normal in size.  ------------------------------------------------------------------- Atrial septum:  No defect or patent foramen ovale was identified.   ------------------------------------------------------------------- Right ventricle:  The cavity size was normal. Wall thickness was normal. Systolic function was normal.  ------------------------------------------------------------------- Pulmonic valve:    Doppler:  There was mild regurgitation.  ------------------------------------------------------------------- Tricuspid valve:   Doppler:  There was mild regurgitation.  ------------------------------------------------------------------- Right atrium:  The atrium was normal in size.  ------------------------------------------------------------------- Pericardium:  The pericardium was normal in appearance.  ------------------------------------------------------------------- Systemic veins: Inferior vena cava: The vessel was normal in size. The respirophasic diameter changes were in the normal range (= 50%), consistent with normal central venous pressure. Diameter: 19 mm.  ------------------------------------------------------------------- Measurements   IVC                                       Value          Reference  ID                                        19    mm       ---------    Left ventricle                            Value          Reference  LV ID, ED, PLAX chordal                   48    mm       43 - 52  LV ID, ES, PLAX chordal                   33    mm       23 - 38  LV fx shortening, PLAX chordal            31    %        >=29  LV PW thickness, ED                       12    mm       ---------  IVS/LV PW ratio, ED                       0.92           <=1.3  Stroke volume, 2D                         33    ml       ---------  Stroke  volume/bsa, 2D                     14    ml/m^2   ---------  LV e&', lateral                            16.8  cm/s     ---------  LV E/e&', lateral                          4.07           ---------  LV e&', medial                             5.55  cm/s     ---------  LV E/e&', medial                           12.32          ---------  LV e&', average                            11.18 cm/s     ---------  LV E/e&', average                          6.12           ---------    Ventricular septum                        Value          Reference  IVS thickness, ED                         11    mm       ---------    LVOT                                      Value          Reference  LVOT ID, S                                20    mm       ---------  LVOT area                                 3.14  cm^2     ---------  LVOT peak velocity, S                     61    cm/s     ---------  LVOT mean velocity, S                     42.7  cm/s     ---------  LVOT VTI, S                               10.6  cm       ---------  Aorta                                     Value          Reference  Aortic root ID, ED                        35    mm       ---------    Left atrium                               Value          Reference  LA ID, A-P, ES                            36    mm       ---------  LA ID/bsa, A-P                            1.56  cm/m^2   <=2.2  LA volume, S                              78.5  ml       ---------  LA volume/bsa, S                          34    ml/m^2   ---------  LA volume, ES, 1-p A4C                    44.9  ml       ---------  LA volume/bsa, ES, 1-p A4C                19.4  ml/m^2   ---------  LA volume, ES, 1-p A2C                    113   ml       ---------  LA volume/bsa, ES, 1-p A2C                48.9  ml/m^2   ---------    Mitral valve                              Value          Reference  Mitral E-wave peak velocity               68.4  cm/s     ---------  Mitral  deceleration time          (L)     85    ms       150 - 230  Mitral pressure half-time                 25    ms       ---------  Mitral valve area, PHT, DP                8.8   cm^2     ---------  Mitral valve area/bsa, PHT, DP  3.81  cm^2/m^2 ---------    Pulmonary arteries                        Value          Reference  PA pressure, S, DP                        14    mm Hg    <=30    Tricuspid valve                           Value          Reference  Tricuspid regurg peak velocity            164   cm/s     ---------  Tricuspid peak RV-RA gradient             11    mm Hg    ---------  Tricuspid maximal regurg                  164   cm/s     ---------  velocity, PISA    Systemic veins                            Value          Reference  Estimated CVP                             3     mm Hg    ---------    Right ventricle                           Value          Reference  TAPSE                                     15.7  mm       ---------  RV pressure, S, DP                        14    mm Hg    <=30  RV s&', lateral, S                         13.3  cm/s     ---------  Legend: (L)  and  (H)  mark values outside specified reference range.  ------------------------------------------------------------------- Prepared and Electronically Authenticated by  Jenkins Rouge, M.D. 2018-08-02T11:45:55    IABP Insertion  LEFT HEART CATH AND CORONARY ANGIOGRAPHY  Conclusion     Prox RCA lesion is 25% stenosed.  Mid RCA lesion is 30% stenosed.  Mid LM lesion is 99% stenosed.  Ost Cx to Prox Cx lesion is 99% stenosed.  LV end diastolic pressure is moderately elevated.  There is moderate left ventricular systolic dysfunction.  The left ventricular ejection fraction is 35-45% by visual estimate.   1. Critical left main and LCx occlusive CAD  2. Moderate LV dysfunction 3. Moderately elevated LVEDP  Plan: IABP support. Recommend emergent CABG. Reviewed with Dr. Roxy Manns  and patient will be taken emergently to OR.  Indications   Non-ST elevation (NSTEMI) myocardial infarction (HCC) [I21.4 (ICD-10-CM)]  Procedural Details/Technique   Technical Details Indication: 72 yo WM with acute NSTEMI  Procedural Details: The right wrist was prepped, draped, and anesthetized with 1% lidocaine. Using US guidance and the modified Seldinger technique, a 6 French slender sheath was introduced into the right radial artery. 3 mg of verapamil was administered through the sheath. Standard Judkins catheters were used for selective coronary angiography and left ventriculography. Catheter exchanges were performed over an exchange length guidewire. There were no immediate procedural complications. Following diagnostic study the right groin was prepped, draped, and anesthetized with 1% lidocaine. Using US guidance and a modified Seldinger technique, a 6 Fr sheath was placed in the right femoral artery. This was exchanged for a Balloon pump sheath and a IABP was placed under fluoro for hemodynamic support.   Contrast: 80 cc   Estimated blood loss <50 mL.  During this procedure the patient was administered the following to achieve and maintain moderate conscious sedation: Versed 2 mg, Fentanyl 25 mcg, while the patient's heart rate, blood pressure, and oxygen saturation were continuously monitored. The period of conscious sedation was 26 minutes, of which I was present face-to-face 100% of this time.  Complications   No complications were associated with this study.  Documented by Martinique, Peter M, MD - 06/24/2017 10:41 AM EST    Coronary Findings   Diagnostic  Dominance: Right  Left Main  Mid LM lesion 99% stenosed  Mid LM lesion is 99% stenosed.  Left Circumflex  Collaterals  Dist Cx filled by collaterals from 2nd RPLB.    Ost Cx to Prox Cx lesion 99% stenosed  Ost Cx to Prox Cx lesion is 99% stenosed.  Right Coronary Artery  Prox RCA lesion 25% stenosed  Prox RCA lesion  is 25% stenosed. The lesion is segmental.  Mid RCA lesion 30% stenosed  Mid RCA lesion is 30% stenosed.  Intervention   No interventions have been documented.  Impella/IABP   Hemodynamic Support An IABP was inserted for hemodynamic support in the setting of cardiogenic shock. Access site: right femoral artery.  Wall Motion              Left Heart   Left Ventricle The left ventricular size is normal. There is moderate left ventricular systolic dysfunction. LV end diastolic pressure is moderately elevated. The left ventricular ejection fraction is 35-45% by visual estimate. There are LV function abnormalities due to segmental dysfunction.  Coronary Diagrams   Diagnostic Diagram       Implants     No implant documentation for this case.  MERGE Images   Show images for CARDIAC CATHETERIZATION   Link to Procedure Log   Procedure Log    Hemo Data    Most Recent Value  AO Systolic Pressure 94 mmHg  AO Diastolic Pressure 58 mmHg  AO Mean 74 mmHg  LV Systolic Pressure 96 mmHg  LV Diastolic Pressure 9 mmHg  LV EDP 30 mmHg  Arterial Occlusion Pressure Extended Systolic Pressure 90 mmHg  Arterial Occlusion Pressure Extended Diastolic Pressure 55 mmHg  Arterial Occlusion Pressure Extended Mean Pressure 70 mmHg  Left Ventricular Apex Extended Systolic Pressure 92 mmHg  Left Ventricular Apex Extended Diastolic Pressure 15 mmHg  Left Ventricular Apex Extended EDP Pressure 26 mmHg      Impression:  I have personally reviewed the patient's diagnostic cardiac catheterization.  The patient has critical 99% stenosis of the distal left main coronary artery with  100% chronic occlusion of the left circumflex coronary artery.  The obtuse marginal branch of the left circumflex fills via collaterals from the right coronary artery.  There is minimal disease in the right coronary territory.  I agree the patient needs prompt surgical revascularization.  He might benefit from concomitant  Maze procedure if left ventricular function is not severely reduced.  He will be at increased risk of bleeding due to recent administration of Xarelto.  Plan:  I have reviewed the indications, risks, and potential benefits of coronary artery bypass grafting with the patient in the cath lab and with his wife in the consultation room.  Alternative treatment strategies have been discussed, including the relative risks, benefits and long term prognosis associated with medical therapy, percutaneous coronary intervention, and surgical revascularization.  The need for emergent intervention was discussed and contrasted with delayed intervention to let the effects of Xarelto to dissipate.  The relative risks and benefits of performing a maze procedure at the time of their surgery was discussed at length, including the expected likelihood of long term freedom from recurrent symptomatic atrial fibrillation and/or atrial flutter.  The patient understands and accepts all potential associated risks of surgery including but not limited to risk of death, stroke or other neurologic complication, myocardial infarction, congestive heart failure, respiratory failure, renal failure, bleeding requiring blood transfusion and/or reexploration, aortic dissection or other major vascular complication, arrhythmia, heart block or bradycardia requiring permanent pacemaker, pneumonia, pleural effusion, wound infection, pulmonary embolus or other thromboembolic complication, chronic pain or other delayed complications related to median sternotomy, or the late recurrence of symptomatic ischemic heart disease and/or congestive heart failure.  The importance of long term risk modification have been emphasized.  All questions answered.    I spent in excess of 90 minutes during the conduct of this hospital consultation and >50% of this time involved direct face-to-face encounter for counseling and/or coordination of the patient's  care.   Valentina Gu. Roxy Manns, MD 06/24/2017 10:49 AM

## 2017-06-24 NOTE — Progress Notes (Signed)
   06/24/17 0949  Clinical Encounter Type  Visited With Patient and family together;Health care provider  Visit Type ED  Referral From Nurse  Consult/Referral To Chaplain  Spiritual Encounters  Spiritual Needs Emotional  Stress Factors  Family Stress Factors Health changes   Responded to a page to the ED for a Code Stemi.  Patient's wife was at bedside.  Once assessed patient was taken to the [place].  [name] asked me to get the spouse situated in the waiting area with a pager.  Escorted the spouse and stayed with her until she had a pager and was situated.  Will follow as needed. Chaplain Katherene Ponto

## 2017-06-24 NOTE — ED Notes (Signed)
Zoll pads placed on pt.

## 2017-06-24 NOTE — H&P (Signed)
 History & Physical    Patient ID: Hector Clark MRN: [id number], DOB/AGE: 70/02/1946   Admit date: 06/24/2017   Primary Physician: Griffin, John, MD Primary Cardiologist: Allred  Patient Profile    72 yo male with PMH of Afib, HL who presented with intermittent chest pain over the week.   Past Medical History   Past Medical History:  Diagnosis Date  . Chest pain    with nagative cardiolite  . Gout    occational flare  . Hyperlipemia   . Persistent atrial fibrillation (HCC)   . Peyronie disease   . Visit for monitoring Tikosyn therapy 06/18/2017    Past Surgical History:  Procedure Laterality Date  . bone graph    . CARDIOVERSION N/A 03/25/2017   Procedure: CARDIOVERSION;  Surgeon: Turner, Traci R, MD;  Location: [facility] ENDOSCOPY;  Service: Cardiovascular;  Laterality: N/A;  . TOE SURGERY Left    4th toe  . tophus       Allergies  No Known Allergies  History of Present Illness    [name] is a 72 yo male with PMH of PAF, and HL. He was recently admitted to the hospital last week for Afib and started on Tikosyn. Planned initially for a cardioversion but then converted to AR just prior to conversion. Discharged home on Xarelto and Tikosyn 500mg BID. Reports Friday afternoon he had some chest discomfort. None Saturday, and then developed chest pain again on Sunday. Wife reported he was up all night, but felt this was related to his Afib and thought his elevated HR. His wife called in this morning to the on call line reporting his symptoms. Asked for the Afib clinic. Given symptoms he was instructed to come to the ED for work up. Wife opted to wait until in the Afib Clinic opened. Reportedly walked into the clinic with symptoms and was brought to the ED.   Initial EKG showed Afib with short runs of VT. He then converted to SR and EKG showed depression in lead I, II, aVL and elevation in lead III and aVR. Code STEMI was called. Family hesitant to come up for cath. Dr.  Jordan met in the ED and discussed with family and patient who agreed to cath.   Home Medications    Prior to Admission medications   Medication Sig Start Date End Date Taking? Authorizing Provider  Cyanocobalamin (VITAMIN B-12 PO) Take 1 tablet by mouth 2 (two) times a week.    [provider]  dofetilide (TIKOSYN) 500 MCG capsule Take 1 capsule (500 mcg total) by mouth 2 (two) times daily. 06/21/17   Barrett, Rhonda G, PA-C  ECHINACEA PO Take 1 capsule by mouth 2 (two) times daily as needed (for cold symptoms).     [provider]  ELDERBERRY PO Take 15 mLs by mouth daily as needed (for cold symptoms).     [provider]  metoprolol succinate (TOPROL XL) 25 MG 24 hr tablet Take 1 tablet (25 mg total) by mouth daily. Ok to take an extra 1/2 tablet prn for afib 06/21/17   Barrett, Rhonda G, PA-C  NON FORMULARY Ivy Leaf liquid: Take 5 ml's by mouth once a day as needed for coughing    [provider]  rivaroxaban (XARELTO) 20 MG TABS tablet Take 1 tablet (20 mg total) by mouth daily with supper. Patient taking differently: Take 20 mg by mouth daily.  02/08/17   Weaver, Scott T, PA-C  vitamin C (ASCORBIC ACID) 500 MG tablet   Take 500 mg by mouth 2 (two) times a week.     [provider]  vitamin E 400 UNIT capsule Take 400 Units by mouth 2 (two) times a week.     [provider]    Family History    Family History  Problem Relation Age of Onset  . Liver cancer Mother 79  . Esophageal cancer Father 83  . Healthy Daughter   . Heart disease Neg Hx     Social History    Social History   Socioeconomic History  . Marital status: Married    Spouse name: Not on file  . Number of children: Not on file  . Years of education: Not on file  . Highest education level: Not on file  Social Needs  . Financial resource strain: Not on file  . Food insecurity - worry: Not on file  . Food insecurity - inability: Not on file  . Transportation needs -  medical: Not on file  . Transportation needs - non-medical: Not on file  Occupational History  . Not on file  Tobacco Use  . Smoking status: Never Smoker  . Smokeless tobacco: Never Used  Substance and Sexual Activity  . Alcohol use: Yes    Comment: 1 beer per month  . Drug use: No  . Sexual activity: Not on file  Other Topics Concern  . 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  Physical Exam    Blood pressure (!) 86/61, pulse (!) 49, resp. rate (!) 23, SpO2 99 %.  General: Pale, and diaphoretic. Psych: Normal affect. Neuro: Alert and oriented X 3. Moves all extremities spontaneously. HEENT: Normal  Neck: Supple without bruits or JVD. Lungs:  Resp regular and unlabored, CTA. Heart: RRR no s3, s4, or murmurs. Abdomen: Soft, non-tender, non-distended, BS + x 4.  Extremities: No clubbing, cyanosis or edema. DP/PT/Radials 2+ and equal bilaterally.  Labs    Troponin (Point of Care Test) No results for input(s): TROPIPOC in the last 72 hours. No results for input(s): CKTOTAL, CKMB, TROPONINI in the last 72 hours. Lab Results  Component Value Date   WBC 11.9 (H) 06/24/2017   HGB 16.8 06/24/2017   HCT 48.5 06/24/2017   MCV 92.0 06/24/2017   PLT 213 06/24/2017    Recent Labs  Lab 06/21/17 0353  NA 137  K 4.2  CL 105  CO2 25  BUN 14  CREATININE 1.02  CALCIUM 9.2  GLUCOSE 120*   No results found for: CHOL, HDL, LDLCALC, TRIG No results found for: DDIMER   Radiology Studies    No results found.  ECG & Cardiac Imaging    EKG: SB with ST depression in I, II, aVL and elevation in lead III and aVR.  Assessment & Plan    72 yo male with PMH of Afib, HL who presented with intermittent chest pain over the week.   1. STEMI: Called in the ED. Dr. Jordan presented to the bedside to discuss  cath with patient and wife at the bedside. Delay in bringing to the cath lab as family was hesitant to present for cath. Given IV heparin, 324mg ASA. Of note, patient did take his Xarelto prior to presenting to the ED. Brought to the lab emergently with   Dr. Jordan  2. PAF: took a Tikosyn/Xarelto this morning. Presented in Afib but now in SR, converted in the ED.    Signed, Lindsay Roberts, NP-C Pager [phone] 06/24/2017, 9:45 AM Patient seen and examined and history reviewed. Agree with above findings and plan. 71 yo WM recently admitted for initiation of Tikosyn for recurrent Afib. Failed Flecainide previously. Prior Echo and nuclear stress test in August looked OK. Patient reports that with Afib he will get mid SSCP radiating to his shoulders, jaw, and ears bilaterally. This am he had the worst pain yet associated with diaphoresis and nausea and vomiting x 1. In ED he was initially in Afib with controlled rate and runs of NSVT. He converted to NSR and chest pain improved but he had persistent ST elevation in lead 3 and AVR only with reciprocal ST depression in the lateral leads. On exam he appears ill. Lungs are clear. No gallop or murmur. Pulses 2+ =. Neuro intact. No edema  Impression:  Acute NSTEMI. High suspicion for severe CAD/occlusion. Recommend emergent cardiac cath/PCI if indicated. Given ASA and IV heparin bolus. Transport emergently to cath lab.   Peter Jordan, MDFACC 06/24/2017 10:26 AM    

## 2017-06-24 NOTE — Procedures (Signed)
Extubation Procedure Note  Patient Details:   Name: Hector Clark DOB: 1945-10-31 MRN: [id number]   Airway Documentation:  Airway 8 mm (Active)  Secured at (cm) 23 cm 06/24/2017  8:00 PM  Measured From Lips 06/24/2017  8:00 PM  Secured Location Right 06/24/2017  8:00 PM  Secured By Pink Tape 06/24/2017  8:00 PM  Cuff Pressure (cm H2O) 28 cm H2O 06/24/2017  4:50 PM  Site Condition Dry 06/24/2017  8:00 PM    Evaluation  O2 sats: stable throughout Complications: No apparent complications Patient did tolerate procedure well. Bilateral Breath Sounds: Clear, Diminished   Yes    Patient extubated without any complications. Patient has an adequate cough and is able to speak. Patient on 4LNC at this time.  Blanchie Serve 06/24/2017, 10:41 PM

## 2017-06-24 NOTE — ED Provider Notes (Signed)
[facility] Provider Note   CSN: [id number] Arrival date & time: 06/24/17  9702     History   Chief Complaint Chief Complaint  Patient presents with  . Chest Pain    HPI Hector Clark is a 72 y.o. male.  The history is provided by the patient, the spouse and medical records. No language interpreter was used.  Chest Pain   This is a recurrent problem. The current episode started 3 to 5 hours ago. The problem occurs constantly. The problem has been gradually improving. The pain is associated with exertion. The pain is present in the substernal region. The pain is at a severity of 10/10. The pain is severe. The quality of the pain is described as exertional, pressure-like and dull. The pain radiates to the right arm, left arm and left neck. Duration of episode(s) is 6 hours. The symptoms are aggravated by exertion. Associated symptoms include diaphoresis, palpitations and shortness of breath. Pertinent negatives include no abdominal pain, no back pain, no cough, no dizziness, no fever, no headaches, no syncope and no vomiting. He has tried nothing for the symptoms. The treatment provided no relief.    Past Medical History:  Diagnosis Date  . Chest pain    with nagative cardiolite  . Gout    occational flare  . Hyperlipemia   . Persistent atrial fibrillation ([place])   . Peyronie disease   . Visit for monitoring Tikosyn therapy 06/18/2017    Patient Active Problem List   Diagnosis Date Noted  . Atypical chest pain 12/15/2016  . Persistent atrial fibrillation ([place]) 12/15/2016    Past Surgical History:  Procedure Laterality Date  . bone graph    . CARDIOVERSION N/A 03/25/2017   Procedure: CARDIOVERSION;  Surgeon: Sueanne Margarita, MD;  Location: [facility] ENDOSCOPY;  Service: Cardiovascular;  Laterality: N/A;  . TOE SURGERY Left    4th toe  . tophus         Home Medications    Prior to Admission medications   Medication Sig Start Date  End Date Taking? Authorizing Provider  Cyanocobalamin (VITAMIN B-12 PO) Take 1 tablet by mouth 2 (two) times a week.    [provider]  dofetilide (TIKOSYN) 500 MCG capsule Take 1 capsule (500 mcg total) by mouth 2 (two) times daily. 06/21/17   Barrett, Evelene Croon, PA-C  ECHINACEA PO Take 1 capsule by mouth 2 (two) times daily as needed (for cold symptoms).     [provider]  ELDERBERRY PO Take 15 mLs by mouth daily as needed (for cold symptoms).     [provider]  metoprolol succinate (TOPROL XL) 25 MG 24 hr tablet Take 1 tablet (25 mg total) by mouth daily. Ok to take an extra 1/2 tablet prn for afib 06/21/17   Barrett, Evelene Croon, PA-C  NON FORMULARY Ivy Leaf liquid: Take 5 ml's by mouth once a day as needed for coughing    [provider]  rivaroxaban (XARELTO) 20 MG TABS tablet Take 1 tablet (20 mg total) by mouth daily with supper. Patient taking differently: Take 20 mg by mouth daily.  02/08/17   [name] T, PA-C  vitamin C (ASCORBIC ACID) 500 MG tablet Take 500 mg by mouth 2 (two) times a week.     [provider]  vitamin E 400 UNIT capsule Take 400 Units by mouth 2 (two) times a week.     [provider]    Family History Family  History  Problem Relation Age of Onset  . Liver cancer Mother 7  . Esophageal cancer Father 44  . Healthy Daughter   . Heart disease Neg Hx     Social History Social History   Tobacco Use  . Smoking status: Never Smoker  . Smokeless tobacco: Never Used  Substance Use Topics  . Alcohol use: Yes    Comment: 1 beer per month  . Drug use: No     Allergies   Patient has no known allergies.   Review of Systems Review of Systems  Constitutional: Positive for diaphoresis and fatigue. Negative for chills and fever.  HENT: Negative for congestion.   Eyes: Negative for visual disturbance.  Respiratory: Positive for chest tightness and shortness of breath. Negative for cough, wheezing and  stridor.   Cardiovascular: Positive for chest pain and palpitations. Negative for syncope.  Gastrointestinal: Negative for abdominal pain and vomiting.  Genitourinary: Negative for flank pain and frequency.  Musculoskeletal: Negative for back pain, neck pain and neck stiffness.  Neurological: Positive for light-headedness. Negative for dizziness and headaches.  Psychiatric/Behavioral: Negative for agitation.  All other systems reviewed and are negative.    Physical Exam Updated Vital Signs There were no vitals taken for this visit.  Physical Exam  Constitutional: He is oriented to person, place, and time. He appears well-developed and well-nourished. No distress.  HENT:  Mouth/Throat: Oropharynx is clear and moist.  Eyes: Conjunctivae and EOM are normal. Pupils are equal, round, and reactive to light.  Cardiovascular: Intact distal pulses. Frequent extrasystoles are present. Tachycardia present.  No murmur heard. intermittnet Vtach  Pulmonary/Chest: Effort normal and breath sounds normal. No stridor. He has no wheezes. He exhibits no tenderness.  Abdominal: He exhibits no distension. There is no tenderness.  Musculoskeletal: He exhibits no tenderness.  Neurological: He is alert and oriented to person, place, and time. No sensory deficit. He exhibits normal muscle tone.  Skin: Capillary refill takes less than 2 seconds. He is diaphoretic. No erythema.  Nursing note and vitals reviewed.    ED Treatments / Results  Labs (all labs ordered are listed, but only abnormal results are displayed) Labs Reviewed  CBC WITH DIFFERENTIAL/PLATELET - Abnormal; Notable for the following components:      Result Value   WBC 11.9 (*)    Neutro Abs 10.3 (*)    All other components within normal limits  COMPREHENSIVE METABOLIC PANEL - Abnormal; Notable for the following components:   CO2 19 (*)    Glucose, Bld 172 (*)    All other components within normal limits  HEMOGLOBIN AND HEMATOCRIT,  BLOOD - Abnormal; Notable for the following components:   Hemoglobin 11.6 (*)    HCT 34.2 (*)    All other components within normal limits  PLATELET COUNT - Abnormal; Notable for the following components:   Platelets 128 (*)    All other components within normal limits  CBC - Abnormal; Notable for the following components:   WBC 29.0 (*)    Platelets 126 (*)    All other components within normal limits  PROTIME-INR - Abnormal; Notable for the following components:   Prothrombin Time 16.3 (*)    All other components within normal limits  GLUCOSE, CAPILLARY - Abnormal; Notable for the following components:   Glucose-Capillary 164 (*)    All other components within normal limits  GLUCOSE, CAPILLARY - Abnormal; Notable for the following components:   Glucose-Capillary 119 (*)    All other components within normal  limits  POCT I-STAT TROPONIN I - Abnormal; Notable for the following components:   Troponin i, poc 0.20 (*)    All other components within normal limits  POCT I-STAT, CHEM 8 - Abnormal; Notable for the following components:   Creatinine, Ser 0.60 (*)    Glucose, Bld 160 (*)    TCO2 21 (*)    All other components within normal limits  POCT I-STAT, CHEM 8 - Abnormal; Notable for the following components:   Creatinine, Ser 0.60 (*)    Glucose, Bld 169 (*)    All other components within normal limits  POCT I-STAT 3, ART BLOOD GAS (G3+) - Abnormal; Notable for the following components:   pH, Arterial 7.306 (*)    pO2, Arterial 319.0 (*)    Bicarbonate 19.5 (*)    TCO2 21 (*)    Acid-base deficit 6.0 (*)    All other components within normal limits  POCT I-STAT, CHEM 8 - Abnormal; Notable for the following components:   Sodium 134 (*)    Chloride 99 (*)    Creatinine, Ser 0.50 (*)    Glucose, Bld 145 (*)    Calcium, Ion 1.03 (*)    TCO2 21 (*)    Hemoglobin 10.9 (*)    HCT 32.0 (*)    All other components within normal limits  POCT I-STAT, CHEM 8 - Abnormal; Notable  for the following components:   Potassium 5.5 (*)    Creatinine, Ser 0.60 (*)    Glucose, Bld 158 (*)    Calcium, Ion 1.05 (*)    Hemoglobin 11.2 (*)    HCT 33.0 (*)    All other components within normal limits  POCT I-STAT, CHEM 8 - Abnormal; Notable for the following components:   Potassium 5.3 (*)    Creatinine, Ser 0.60 (*)    Glucose, Bld 169 (*)    Calcium, Ion 1.06 (*)    Hemoglobin 10.9 (*)    HCT 32.0 (*)    All other components within normal limits  POCT I-STAT 4, (NA,K, GLUC, HGB,HCT) - Abnormal; Notable for the following components:   Glucose, Bld 158 (*)    All other components within normal limits  POCT I-STAT 3, ART BLOOD GAS (G3+) - Abnormal; Notable for the following components:   pH, Arterial 7.310 (*)    Acid-base deficit 5.0 (*)    All other components within normal limits  PROTIME-INR  MAGNESIUM  APTT  COOXEMETRY PANEL  BLOOD GAS, ARTERIAL  CBC  BASIC METABOLIC PANEL  MAGNESIUM  BLOOD GAS, ARTERIAL  I-STAT TROPONIN, ED  TYPE AND SCREEN  PREPARE RBC (CROSSMATCH)  ABO/RH    EKG  EKG Interpretation  Date/Time:  Monday June 24 2017 08:54:17 EST Ventricular Rate:  116 PR Interval:    QRS Duration: 96 QT Interval:  362 QTC Calculation: 503 R Axis:   -13 Text Interpretation:  Atrial fibrillation with rapid ventricular response with premature ventricular or aberrantly conducted complexes Moderate voltage criteria for LVH, may be normal variant Marked ST abnormality, possible inferior subendocardial injury Marked ST abnormality, possible lateral subendocardial injury Abnormal ECG \ when compared to prior, new Vtach episodes.  ** ** ACUTE MI / STEMI ** ** Confirmed by Antony Blackbird [phone]) on 06/24/2017 8:59:16 AM       Radiology Dg Chest Port 1 View  Result Date: 06/24/2017 CLINICAL DATA:  Post op film for CABG today, check support apparatus. EXAM: PORTABLE CHEST 1 VIEW COMPARISON:  10/22/2016 FINDINGS: Midline sternotomy.  Endotracheal tube 5.7 cm  from carina. NG tube extends distal esophagus. Swan-Ganz catheter with tip in the main pulmonary artery. LEFT chest tube and mediastinal drain noted. No pneumothorax. No pulmonary edema. IMPRESSION: 1. Support apparatus in appropriate position. 2. No pulmonary edema or pneumothorax. Electronically Signed   By: Suzy Bouchard M.D.   On: 06/24/2017 17:29    Procedures Procedures (including critical care time)  CRITICAL CARE Performed by: Gwenyth Allegra Tegeler Total critical care time: 35 minutes Critical care time was exclusive of separately billable procedures and treating other patients. Critical care was necessary to treat or prevent imminent or life-threatening deterioration. Critical care was time spent personally by me on the following activities: development of treatment plan with patient and/or surrogate as well as nursing, discussions with consultants, evaluation of patient's response to treatment, examination of patient, obtaining history from patient or surrogate, ordering and performing treatments and interventions, ordering and review of laboratory studies, ordering and review of radiographic studies, pulse oximetry and re-evaluation of patient's condition.   Medications Ordered in ED Medications  heparin injection 4,000 Units (4,000 Units Intravenous Not Given 06/24/17 0917)  heparin 5000 UNIT/ML injection (not administered)     Initial Impression / Assessment and Plan / ED Course  I have reviewed the triage vital signs and the nursing notes.  Pertinent labs & imaging results that were available during my care of the patient were reviewed by me and considered in my medical decision making (see chart for details).     Hector Clark is a 73 y.o. male with a past medical history significant for atrial fibrillation status post recent admission for cardioversion on Tikosyn who presents with chest pain.  EKG was brought to me from triage showing concern for episodes of V. tach  and STEMI.  Code STEMI was called and patient quickly taken to assessment room.  Patient reports that at 3:30 AM this morning, he woke up with chest pain, nausea, and vomiting.  He was very sweaty and had palpitations and intermittent shortness of breath.  He reports that his pain was a 10 out of 10 in severity onset and is now a 2 out of 10.  He thinks he may have vomited the decrease in and did not take his Xarelto this morning.  He describes the pain as pressure-like and radiating into his neck and his arms.  He denies recent trauma.  He denies other complaints.  On exam, patient is diaphoretic and tachycardic.  Patient's lungs were clear bilaterally.  Chest was nontender.  Abdomen was nontender.  Cardiology came to the bedside to assess the patient.    Patient was started on fluids as he became hypotensive in the setting of sinus bradycardia.  Patient was started on heparin and cardiology came to see the patient.  Cardiology took patient to the Cath Lab for further management.    Final Clinical Impressions(s) / ED Diagnoses   Final diagnoses:  Precordial pain  ST elevation myocardial infarction (STEMI), unspecified artery (HCC)    Clinical Impression: 1. Precordial pain   2. Atelectasis   3. S/P CABG x 2   4. ST elevation myocardial infarction (STEMI), unspecified artery ([place])     Disposition: Admit  This note was prepared with assistance of Dragon voice recognition software. Occasional wrong-word or sound-a-like substitutions may have occurred due to the inherent limitations of voice recognition software.      Tegeler, Gwenyth Allegra, MD 06/24/17 [phone]

## 2017-06-24 NOTE — ED Triage Notes (Addendum)
Pt in from home c/o CP, pt hx of being seen for cardioversion last week per wife and did not require it d/t self cardioversion after sedation meds were administered, pt here today diaphoretic & SOB with mid non radiating CP, pt had x 1 emesis today after taking Tikison, onset symptoms today @ 3:30 am

## 2017-06-24 NOTE — Anesthesia Procedure Notes (Signed)
Arterial Line Insertion Start/End2/08/2017 4:30 PM, 06/24/2017 4:40 PM Performed by: Murvin Natal, MD, anesthesiologist  Patient location: OR. Preanesthetic checklist: patient identified, IV checked, site marked, risks and benefits discussed, surgical consent, monitors and equipment checked, pre-op evaluation, timeout performed and anesthesia consent Left, radial was placed Catheter size: 20 Fr Hand hygiene performed , maximum sterile barriers used  and Seldinger technique used  Attempts: 1 Procedure performed using ultrasound guided technique. Following insertion, dressing applied and Biopatch. Post procedure assessment: normal and unchanged  Patient tolerated the procedure well with no immediate complications.

## 2017-06-24 NOTE — Progress Notes (Addendum)
Patient ID: Hector Clark, male   DOB: 08-28-1945, 72 y.o.   MRN: [id number] EVENING ROUNDS NOTE :     [address]       [place] [address]             [phone]                 Day of Surgery Procedure(s) (LRB): CORONARY ARTERY BYPASS GRAFTING (CABG) times two utilizing left anterior mammary artery and right greater saphenous vein harvested endoscopically (N/A) TRANSESOPHAGEAL ECHOCARDIOGRAM (TEE) (N/A) CLIPPING OF ATRIAL APPENDAGE (N/A) MAZE (N/A)  Total Length of Stay:  LOS: 0 days  BP (!) 129/91   Pulse 81   Temp (!) 97.3 F (36.3 C)   Resp 14   SpO2 100%   .Intake/Output      02/04 0701 - 02/05 0700   I.V. 1676.1   Total Intake 1676.1   Urine 2455   Blood 700   Chest Tube 200   Total Output 3355   Net -1678.9         . sodium chloride 20 mL/hr at 06/24/17 1900  . sodium chloride 100 mL/hr at 06/24/17 1900  . [START ON 06/25/2017] sodium chloride    . sodium chloride 20 mL/hr at 06/24/17 1900  . albumin human    . [START ON 06/25/2017] cefUROXime (ZINACEF)  IV    . dexmedetomidine (PRECEDEX) IV infusion 0.501 mcg/kg/hr (06/24/17 1900)  . famotidine (PEPCID) IV    . insulin (NOVOLIN-R) infusion 6.2 Units/hr (06/24/17 1810)  . lactated ringers    . lactated ringers Stopped (06/24/17 1700)  . lactated ringers 20 mL/hr at 06/24/17 1900  . magnesium sulfate 4 g (06/24/17 1800)  . nitroGLYCERIN Stopped (06/24/17 1645)  . phenylephrine (NEO-SYNEPHRINE) Adult infusion 15 mcg/min (06/24/17 1900)  . [START ON 06/25/2017] vancomycin       Lab Results  Component Value Date   WBC 29.0 (H) 06/24/2017   HGB 15.0 06/24/2017   HCT 44.0 06/24/2017   PLT 126 (L) 06/24/2017   GLUCOSE 158 (H) 06/24/2017   ALT 25 06/24/2017   AST 41 06/24/2017   NA 140 06/24/2017   K 4.5 06/24/2017   CL 102 06/24/2017   CREATININE 0.60 (L) 06/24/2017   BUN 13 06/24/2017   CO2 19 (L) 06/24/2017   TSH 2.850 12/14/2016   INR 1.32 06/24/2017   Remains on vent, IAB in  place, good uop and COX 64, CI low by s/g  Feet viable , pulses present by doppler   Grace Isaac MD  Beeper [phone] Office [phone] 06/24/2017 7:54 PM

## 2017-06-24 NOTE — Telephone Encounter (Signed)
Pt's wife called in this morning reporting he was recently started on Tikosyn and discharged last week. Reports he had been having intermittent episodes of chest pain since yesterday. Kept him up all night. She feels this is his "heart out of rhythm". Wanted to talk with [facility] clinic. Instructed that if he feels this bad should be brought to the ED. She was hesitant to bring him with his new medications. Wanted an appt in the [facility]. Strongly instructed to come to the ED with his chest pain. States she will consider, and call back when clinic opens.

## 2017-06-24 NOTE — Anesthesia Preprocedure Evaluation (Addendum)
Anesthesia Evaluation  Patient identified by MRN, date of birth, ID band Patient awake    Reviewed: Allergy & Precautions, NPO status , Patient's Chart, lab work & pertinent test results, reviewed documented beta blocker date and time   Airway Mallampati: III  TM Distance: >3 FB Neck ROM: Full    Dental  (+) Teeth Intact, Chipped,    Pulmonary neg pulmonary ROS,    Pulmonary exam normal breath sounds clear to auscultation       Cardiovascular + CAD and + Past MI  Normal cardiovascular exam+ dysrhythmias Atrial Fibrillation  Rhythm:Regular Rate:Normal  ECG: SB, rate 43  Echo 12/20/16: Left ventricle: The cavity size was normal. Wall thickness was normal. Systolic function was normal. The estimated ejection fraction was in the range of 50% to 55%. The study is not technically sufficient to allow evaluation of LV diastolic function. Mitral valve: There was mild regurgitation. Atrial septum: No defect or patent foramen ovale was identified.  There was no ST segment deviation noted during stress. Defect 1: There is a small defect of severe severity present in the apex location. This is a low risk study.   Low risk stress nuclear study with apical thinning vs small prior infarct; no significant ischemia; study not gated due to atrial fibrillation.    Neuro/Psych negative neurological ROS  negative psych ROS   GI/Hepatic negative GI ROS,   Endo/Other  Hyperlipidemia Gout  Renal/GU negative Renal ROS     Musculoskeletal   Abdominal   Peds  Hematology HLD   Anesthesia Other Findings LEFT MAIN DISEASE IABP 1:1  Reproductive/Obstetrics Peyronie's Disease                            Anesthesia Physical  Anesthesia Plan  ASA: IV and emergent  Anesthesia Plan: General   Post-op Pain Management:    Induction: Intravenous  PONV Risk Score and Plan: 2 and Ondansetron and Midazolam  Airway  Management Planned: Oral ETT  Additional Equipment: Arterial line, CVP, PA Cath, TEE and Ultrasound Guidance Line Placement  Intra-op Plan:   Post-operative Plan: Post-operative intubation/ventilation  Informed Consent: I have reviewed the patients History and Physical, chart, labs and discussed the procedure including the risks, benefits and alternatives for the proposed anesthesia with the patient or authorized representative who has indicated his/her understanding and acceptance.   Dental advisory given  Plan Discussed with: CRNA  Anesthesia Plan Comments:        Anesthesia Quick Evaluation

## 2017-06-24 NOTE — Op Note (Signed)
CARDIOTHORACIC SURGERY OPERATIVE NOTE  Date of Procedure: 06/24/2017  Preoperative Diagnosis:   Critical Left Main Coronary Artery Disease  Acute Coronary Syndrome   S/P Acute Non-ST Segment Elevation Myocardial Infarction  Recurrent Persistent Atrial Fibrillation  Postoperative Diagnosis: Same  Procedure:    Coronary Artery Bypass Grafting x 2   Left Internal Mammary Artery to Distal Left Anterior Descending Coronary Artery  Saphenous Vein Graft to Obtuse Marginal Branch of Left Circumflex Coronary Artery  Endoscopic Vein Harvest from Right Thigh    Maze Procedure   Left atrial lesion set using bipolar radiofrequency and cryothermy ablation  Clipping of left atrial appendage (Atriclip size 66mm)   Surgeon: Valentina Gu. Roxy Manns, MD  Assistant: Nani Skillern, PA-C  Anesthesia: Murvin Natal, MD  Operative Findings:  Mild left ventricular systolic function  Good quality left internal mammary artery conduit  Good quality saphenous vein conduit  Good quality target vessels for grafting    BRIEF CLINICAL NOTE AND INDICATIONS FOR SURGERY  Patient is a 72 year old male with history of atrial fibrillation and hyperlipidemia who has been referred for emergent surgical consultation due to the discovery of critical left main coronary artery disease in the setting of acute coronary syndrome and possible acute evolving ST segment elevation myocardial infarction.  The patient's cardiac history dates back to July 2018 when he was initially evaluated by Dr. and for symptoms of chest pain.  Symptoms were reported as episodic and not necessarily associated with activity.  The patient did describe associated symptoms of exertional shortness of breath.  He was noted to be in atrial fibrillation at the time.  A stress test and echocardiogram were performed.  Nuclear medicine stress test was felt to be low risk.  Echocardiogram revealed normal left ventricular systolic function.  The  patient was referred to the atrial fibrillation clinic and has been followed there intermittently ever since.  He has recently been anticoagulated using Xarelto.  He previously was treated with flecainide but failed to maintain sinus rhythm.  Last week he was hospitalized for Tikosyn loading.  He initially converted to sinus rhythm but had recurrent A. fib.  He was taken for DC cardioversion but spontaneously converted to sinus rhythm with induction of anesthesia and cardioversion was canceled.  The patient was discharged home.  Early this morning he woke from his sleep having crushing substernal chest pain.  He presented to the emergency room where he was initially in atrial fibrillation.  In the emergency department he spontaneously converted to sinus rhythm but had ST segment depression concerning for possible acute myocardial ischemia.  He was brought directly to the cardiac Cath Lab where he was found to have critical left main coronary artery disease.  Intra-aortic balloon pump was placed and emergent cardiothoracic surgical consultation requested.  The patient has been seen in consultation and counseled at length regarding the indications, risks and potential benefits of surgery.  All questions have been answered, and the patient provides full informed consent for the operation as described.    DETAILS OF THE OPERATIVE PROCEDURE  Preparation:  The patient is brought to the operating room on the above mentioned date and central monitoring was established by the anesthesia team including placement of Swan-Ganz catheter and radial arterial line. The patient is placed in the supine position on the operating table.  Intravenous antibiotics are administered. General endotracheal anesthesia is induced uneventfully. A Foley catheter is placed.  Baseline transesophageal echocardiogram was performed.  Findings were notable for mild LV systolic  dysfunction with EF estimated 40%.  There was mild central mitral  regurgitation, trace central aortic insufficiency, mild central tricuspid regurgitation.  The patient's chest, abdomen, both groins, and both lower extremities are prepared and draped in a sterile manner. A time out procedure is performed.   Surgical Approach and Conduit Harvest:  A median sternotomy incision was performed and the left internal mammary artery is dissected from the chest wall and prepared for bypass grafting. The left internal mammary artery is notably good quality conduit. Simultaneously, the greater saphenous vein is obtained from the patient's right thigh using endoscopic vein harvest technique. The saphenous vein is notably somewhat sclerotic but otherwise good quality conduit. After removal of the saphenous vein, the small surgical incisions in the lower extremity are closed with absorbable suture. Following systemic heparinization, the left internal mammary artery was transected distally noted to have excellent flow.   Extracorporeal Cardiopulmonary Bypass and Myocardial Protection:  The right common femoral vein is cannulated using the Seldinger technique and a guidewire advanced into the right atrium using TEE guidance.  The patient is heparinized systemically and the femoral vein cannulated using a 22 Fr long femoral venous cannula.  The ascending aorta is cannulated for cardiopulmonary bypass.  Adequate heparinization is verified.   A retrograde cardioplegia cannula is placed through the right atrium into the coronary sinus.   The entire pre-bypass portion of the operation was notable for stable hemodynamics.  Cardiopulmonary bypass was begun and the surface of the heart is inspected.  A second venous cannula is placed directly into the superior vena cava.   A cardioplegia cannula is placed in the ascending aorta.  A temperature probe was placed in the interventricular septum.  The patient is allowed to cool passively to [facility] systemic temperature.  The aortic cross clamp  is applied and cold blood cardioplegia is delivered initially in an antegrade fashion through the aortic root.   Supplemental cardioplegia is given retrograde through the coronary sinus catheter.  Iced saline slush is applied for topical hypothermia.  The initial cardioplegic arrest is rapid with early diastolic arrest.  Repeat doses of cardioplegia are administered intermittently throughout the entire cross clamp portion of the operation through the aortic root, through the coronary sinus catheter, and through subsequently placed vein graft in order to maintain completely flat electrocardiogram and septal myocardial temperature below 15C.  Myocardial protection was felt to be excellent.   Maze Procedure (part 1):  The AtriCure Synergy bipolar radiofrequency ablation clamp is used for all radiofrequency ablation lesions for the maze procedure.  The Atricure CryoICE nitrous oxide cryothermy system is utilized for all cryothermy ablation lesions.   The heart is retracted towards the surgeon's side and the left sided pulmonary veins exposed.  An elliptical ablation lesion is created around the base of the left sided pulmonary veins.  A similar elliptical lesion was created around the base of the left atrial appendage.  The left atrial appendage was obliterated using an Atricure left atrial appendage clip (Atriclip Pro240, size 50mm).  The heart was replaced into the pericardial sac.   Coronary Artery Bypass Grafting:   The obtuse marginal branch of the left circumflex coronary artery was grafted using a reversed saphenous vein graft in an end-to-side fashion.  At the site of distal anastomosis the target vessel was good quality and measured approximately 1.8 mm in diameter.  The distal left anterior coronary artery was grafted with the left internal mammary artery in an end-to-side fashion.  At the site  of distal anastomosis the target vessel was good quality and measured approximately 2.0 mm in  diameter.   Maze Procedure (part 2):  A left atriotomy incision was performed through the interatrial groove and extended partially across the back wall of the left atrium after opening the oblique sinus inferiorly.  The floor of the left atrium and the mitral valve were exposed using a self-retaining retractor.  The mitral valve was inspected.  An ablation lesion was placed around the right sided pulmonary veins using the bipolar clamp with one limb of the clamp along the endocardial surface and one along the epicardial surface posteriorly.  A bipolar ablation lesion was placed across the dome of the left atrium from the cephalad apex of the atriotomy incision to reach the cephalad apex of the elliptical lesion around the left sided pulmonary veins.  A similar bipolar lesion was placed across the back wall of the left atrium from the caudad apex of the atriotomy incision to reach the caudad apex of the elliptical lesion around the left sided pulmonary veins, thereby completing a box.  Finally another bipolar lesion was placed across the back wall of the left atrium from the caudad apex of the atriotomy incision towards the posterior mitral valve annulus.  This lesion was completed along the endocardial surface onto the posterior mitral annulus with a 3 minute duration cryothermy lesion, followed by a second cryothermy lesion along the posterior epicardial surface of the left atrium across the coronary sinus.  This completes the entire left side lesion set of the Cox maze procedure.   Procedure Completion:  The left atriotomy incision was closed using a 2 layer closure of running 3-0 Prolene suture after placing a sump drain across the mitral valve to serve as a left ventricular vent.  The proximal vein graft anastomosis was placed directly to the ascending aorta prior to removal of the aortic cross clamp.  The septal myocardial temperature rose rapidly after reperfusion of the left internal mammary  artery graft.  The aortic cross clamp was removed after a total cross clamp time of 82 minutes.  All proximal and distal coronary anastomoses were inspected for hemostasis and appropriate graft orientation. Epicardial pacing wires are fixed to the right ventricular outflow tract and to the right atrial appendage. The patient is rewarmed to 37C temperature.  The superior vena cava cannula was removed.  The left ventricular vent is removed.  The patient is weaned and disconnected from cardiopulmonary bypass.  The patient's rhythm at separation from bypass was AV paced.  The patient was weaned from cardiopulmonary bypass without any inotropic support. Total cardiopulmonary bypass time for the operation was 107 minutes.  Intra-aortic balloon counter pulsation was resumed one-to-one.  Followup transesophageal echocardiogram performed after separation from bypass revealed no changes from the preoperative exam.  The aortic cannula was removed uneventfully. Protamine was administered to reverse the anticoagulation.  The femoral venous cannula was removed and manual pressure held on the groin for 30 minutes.  The mediastinum and pleural space were inspected for hemostasis and irrigated with saline solution. The mediastinum and both pleural spaces were drained using 4 chest tubes placed through separate stab incisions inferiorly.  The soft tissues anterior to the aorta were reapproximated loosely. The sternum is closed with double strength sternal wire. The soft tissues anterior to the sternum were closed in multiple layers and the skin is closed with a running subcuticular skin closure.  The post-bypass portion of the operation was notable for stable rhythm  and hemodynamics.  No blood products were administered during the operation.   Disposition:  The patient tolerated the procedure well and is transported to the surgical intensive care in stable condition. There are no intraoperative complications. All  sponge instrument and needle counts are verified correct at completion of the operation.    Valentina Gu. Roxy Manns MD 06/24/2017 4:02 PM

## 2017-06-24 NOTE — Brief Op Note (Addendum)
06/24/2017  4:01 PM  PATIENT:  Hector Clark  72 y.o. male  PRE-OPERATIVE DIAGNOSIS:  1. S/p STEMI 2.CAD (CRITICAL LEFT MAIN DISEASE) 3. ATRIAL FIBRILLATION  POST-OPERATIVE DIAGNOSIS:    1. S/p STEMI 2.CAD (CRITICAL LEFT MAIN DISEASE) 3. ATRIAL FIBRILLATION   PROCEDURE: TRANSESOPHAGEAL ECHOCARDIOGRAM (TEE),EMERGENT MEDIAN STERNOTOMY for CORONARY ARTERY BYPASS GRAFTING (CABG) x 2 (LIMA to LAD, SVG to OM) with EVH for the RIGHT GREATER SAPHEOUS VEIN and LEFT INTERNAL MAMMAERY ARTERY, CLIPPING OF ATRIAL APPENDAGE, MAZE   SURGEON:  Surgeon(s) and Role:    Rexene Alberts, MD - Primary  PHYSICIAN ASSISTANT: Lars Pinks PA-C  ASSISTANTS: Alcide Evener RNFA  ANESTHESIA:   general  EBL:  700 mL   DRAINS: Chest tubes placed in the mediastinal and pleural spaces   COUNTS CORRECT:  YES   DICTATION: .Dragon Dictation  PLAN OF CARE: Admit to inpatient   PATIENT DISPOSITION:  ICU - intubated and hemodynamically stable.   Delay start of Pharmacological VTE agent (>24hrs) due to surgical blood loss or risk of bleeding: yes

## 2017-06-24 NOTE — Transfer of Care (Signed)
Immediate Anesthesia Transfer of Care Note  Patient: Hector Clark  Procedure(s) Performed: CORONARY ARTERY BYPASS GRAFTING (CABG) times two utilizing left anterior mammary artery and right greater saphenous vein harvested endoscopically (N/A Chest) TRANSESOPHAGEAL ECHOCARDIOGRAM (TEE) (N/A ) CLIPPING OF ATRIAL APPENDAGE (N/A Chest) MAZE (N/A )  Patient Location: PACU  Anesthesia Type:General  Level of Consciousness: sedated and Patient remains intubated per anesthesia plan  Airway & Oxygen Therapy: Patient remains intubated per anesthesia plan and Patient placed on Ventilator (see vital sign flow sheet for setting)  Post-op Assessment: Report given to RN and Post -op Vital signs reviewed and stable  Post vital signs: Reviewed and stable  Last Vitals:  Vitals:   06/24/17 1026 06/24/17 1031  BP: 99/69 (!) 103/58  Pulse: 63 62  Resp: 13 12  SpO2: 100% 100%    Last Pain:  Vitals:   06/24/17 0910  PainSc: 2          Complications: No apparent anesthesia complications   Patient transported to ICU with standard monitors (HR, BP, SPO2, RR), balloon pump and emergency drugs/equipment. Controlled ventilation maintained via ambu bag. Report given to bedside RN and respiratory therapist. Pt connected to ICU monitor and ventilator. All questions answered and vital signs stable before leaving

## 2017-06-24 NOTE — ED Notes (Signed)
Unable to obtain oral temp, temporal reading 95.7 F, [place] Cardiologist aware, declines getting rectal temp now and wants pt transported straight to the cath lab, pt skin cool to touch, pt belongings given to his spouse, pt aware of plan of care

## 2017-06-24 NOTE — ED Notes (Signed)
Cardiology at bedside.

## 2017-06-24 NOTE — Anesthesia Procedure Notes (Signed)
Central Venous Catheter Insertion Performed by: Murvin Natal, MD, anesthesiologist Start/End2/08/2017 11:15 AM, 06/24/2017 11:25 AM Patient location: Pre-op. Preanesthetic checklist: patient identified, IV checked, site marked, risks and benefits discussed, surgical consent, monitors and equipment checked, pre-op evaluation, timeout performed and anesthesia consent Position: Trendelenburg Hand hygiene performed  and maximum sterile barriers used  Catheter size: 8.5 Fr Total catheter length 10. PA cath was placed.Sheath introducer Swan type:thermodilution PA Cath depth:44 Procedure performed using ultrasound guided technique. Attempts: 1 Following insertion, line sutured, dressing applied and Biopatch. Post procedure assessment: free fluid flow, blood return through all ports and no air  Patient tolerated the procedure well with no immediate complications.

## 2017-06-24 NOTE — Progress Notes (Signed)
NIF -27, VC 1.2L

## 2017-06-24 NOTE — Progress Notes (Signed)
  Echocardiogram Echocardiogram Transesophageal has been performed.  Hector Clark M 06/24/2017, 11:52 AM

## 2017-06-25 ENCOUNTER — Other Ambulatory Visit: Payer: Self-pay

## 2017-06-25 ENCOUNTER — Inpatient Hospital Stay (HOSPITAL_COMMUNITY): Payer: Medicare Other

## 2017-06-25 ENCOUNTER — Encounter (HOSPITAL_COMMUNITY): Payer: Self-pay | Admitting: Thoracic Surgery (Cardiothoracic Vascular Surgery)

## 2017-06-25 LAB — GLUCOSE, CAPILLARY
GLUCOSE-CAPILLARY: 96 mg/dL (ref 65–99)
GLUCOSE-CAPILLARY: 109 mg/dL — AB (ref 65–99)
GLUCOSE-CAPILLARY: 117 mg/dL — AB (ref 65–99)
GLUCOSE-CAPILLARY: 115 mg/dL — AB (ref 65–99)
GLUCOSE-CAPILLARY: 127 mg/dL — AB (ref 65–99)
Glucose-Capillary: 107 mg/dL — ABNORMAL HIGH (ref 65–99)
Glucose-Capillary: 107 mg/dL — ABNORMAL HIGH (ref 65–99)
Glucose-Capillary: 109 mg/dL — ABNORMAL HIGH (ref 65–99)
Glucose-Capillary: 110 mg/dL — ABNORMAL HIGH (ref 65–99)
Glucose-Capillary: 110 mg/dL — ABNORMAL HIGH (ref 65–99)
Glucose-Capillary: 111 mg/dL — ABNORMAL HIGH (ref 65–99)
Glucose-Capillary: 113 mg/dL — ABNORMAL HIGH (ref 65–99)
Glucose-Capillary: 114 mg/dL — ABNORMAL HIGH (ref 65–99)
Glucose-Capillary: 122 mg/dL — ABNORMAL HIGH (ref 65–99)
Glucose-Capillary: 123 mg/dL — ABNORMAL HIGH (ref 65–99)
Glucose-Capillary: 130 mg/dL — ABNORMAL HIGH (ref 65–99)
Glucose-Capillary: 146 mg/dL — ABNORMAL HIGH (ref 65–99)
Glucose-Capillary: 96 mg/dL (ref 65–99)
Glucose-Capillary: 97 mg/dL (ref 65–99)
Glucose-Capillary: 98 mg/dL (ref 65–99)

## 2017-06-25 LAB — CBC
HCT: 38.4 % — ABNORMAL LOW (ref 39.0–52.0)
HCT: 36.4 % — ABNORMAL LOW (ref 39.0–52.0)
HEMOGLOBIN: 12.9 g/dL — AB (ref 13.0–17.0)
Hemoglobin: 12 g/dL — ABNORMAL LOW (ref 13.0–17.0)
MCH: 30.5 pg (ref 26.0–34.0)
MCH: 30.5 pg (ref 26.0–34.0)
MCHC: 33.6 g/dL (ref 30.0–36.0)
MCHC: 33 g/dL (ref 30.0–36.0)
MCV: 90.8 fL (ref 78.0–100.0)
MCV: 92.4 fL (ref 78.0–100.0)
PLATELETS: 125 10*3/uL — AB (ref 150–400)
PLATELETS: 112 10*3/uL — AB (ref 150–400)
RBC: 4.23 MIL/uL (ref 4.22–5.81)
RBC: 3.94 MIL/uL — ABNORMAL LOW (ref 4.22–5.81)
RDW: 13.1 % (ref 11.5–15.5)
RDW: 13.5 % (ref 11.5–15.5)
WBC: 21.9 10*3/uL — ABNORMAL HIGH (ref 4.0–10.5)
WBC: 22.6 10*3/uL — AB (ref 4.0–10.5)

## 2017-06-25 LAB — POCT I-STAT 3, ART BLOOD GAS (G3+)
ACID-BASE DEFICIT: 5 mmol/L — AB (ref 0.0–2.0)
ACID-BASE DEFICIT: 6 mmol/L — AB (ref 0.0–2.0)
Acid-base deficit: 5 mmol/L — ABNORMAL HIGH (ref 0.0–2.0)
Acid-base deficit: 4 mmol/L — ABNORMAL HIGH (ref 0.0–2.0)
BICARBONATE: 20.1 mmol/L (ref 20.0–28.0)
BICARBONATE: 20.8 mmol/L (ref 20.0–28.0)
Bicarbonate: 20.8 mmol/L (ref 20.0–28.0)
Bicarbonate: 21.7 mmol/L (ref 20.0–28.0)
O2 SAT: 94 %
O2 SAT: 96 %
O2 SAT: 96 %
O2 Saturation: 97 %
PCO2 ART: 35.9 mmHg (ref 32.0–48.0)
PCO2 ART: 38 mmHg (ref 32.0–48.0)
PCO2 ART: 45 mmHg (ref 32.0–48.0)
PH ART: 7.329 — AB (ref 7.350–7.450)
PO2 ART: 80 mmHg — AB (ref 83.0–108.0)
PO2 ART: 82 mmHg — AB (ref 83.0–108.0)
Patient temperature: 36.8
Patient temperature: 36.5
TCO2: 21 mmol/L — AB (ref 22–32)
TCO2: 22 mmol/L (ref 22–32)
TCO2: 22 mmol/L (ref 22–32)
TCO2: 23 mmol/L (ref 22–32)
pCO2 arterial: 43 mmHg (ref 32.0–48.0)
pH, Arterial: 7.289 — ABNORMAL LOW (ref 7.350–7.450)
pH, Arterial: 7.29 — ABNORMAL LOW (ref 7.350–7.450)
pH, Arterial: 7.369 (ref 7.350–7.450)
pO2, Arterial: 90 mmHg (ref 83.0–108.0)
pO2, Arterial: 95 mmHg (ref 83.0–108.0)

## 2017-06-25 LAB — POCT I-STAT, CHEM 8
BUN: 14 mg/dL (ref 6–20)
CALCIUM ION: 1.2 mmol/L (ref 1.15–1.40)
CHLORIDE: 104 mmol/L (ref 101–111)
Creatinine, Ser: 0.6 mg/dL — ABNORMAL LOW (ref 0.61–1.24)
Glucose, Bld: 128 mg/dL — ABNORMAL HIGH (ref 65–99)
HCT: 35 % — ABNORMAL LOW (ref 39.0–52.0)
HEMOGLOBIN: 11.9 g/dL — AB (ref 13.0–17.0)
POTASSIUM: 4.6 mmol/L (ref 3.5–5.1)
SODIUM: 140 mmol/L (ref 135–145)
TCO2: 21 mmol/L — ABNORMAL LOW (ref 22–32)

## 2017-06-25 LAB — BASIC METABOLIC PANEL
Anion gap: 9 (ref 5–15)
BUN: 13 mg/dL (ref 6–20)
CHLORIDE: 110 mmol/L (ref 101–111)
CO2: 22 mmol/L (ref 22–32)
CREATININE: 0.75 mg/dL (ref 0.61–1.24)
Calcium: 8.2 mg/dL — ABNORMAL LOW (ref 8.9–10.3)
Glucose, Bld: 127 mg/dL — ABNORMAL HIGH (ref 65–99)
POTASSIUM: 4.3 mmol/L (ref 3.5–5.1)
SODIUM: 141 mmol/L (ref 135–145)

## 2017-06-25 LAB — MAGNESIUM
MAGNESIUM: 2.4 mg/dL (ref 1.7–2.4)
MAGNESIUM: 2.3 mg/dL (ref 1.7–2.4)

## 2017-06-25 LAB — CREATININE, SERUM: Creatinine, Ser: 0.83 mg/dL (ref 0.61–1.24)

## 2017-06-25 LAB — SURGICAL PCR SCREEN
MRSA, PCR: NEGATIVE
Staphylococcus aureus: NEGATIVE

## 2017-06-25 LAB — POCT ACTIVATED CLOTTING TIME: Activated Clotting Time: 125 seconds

## 2017-06-25 MED ORDER — ASPIRIN EC 325 MG PO TBEC
325.0000 mg | DELAYED_RELEASE_TABLET | Freq: Every day | ORAL | Status: AC
  Administered 2017-06-26 – 2017-06-27 (×2): 325 mg via ORAL
  Filled 2017-06-25 (×2): qty 1

## 2017-06-25 MED ORDER — SODIUM BICARBONATE 8.4 % IV SOLN
25.0000 meq | Freq: Once | INTRAVENOUS | Status: AC
  Administered 2017-06-25: 25 meq via INTRAVENOUS

## 2017-06-25 MED ORDER — INSULIN ASPART 100 UNIT/ML ~~LOC~~ SOLN
0.0000 [IU] | SUBCUTANEOUS | Status: DC
  Administered 2017-06-25 – 2017-06-26 (×4): 2 [IU] via SUBCUTANEOUS

## 2017-06-25 MED ORDER — INSULIN DETEMIR 100 UNIT/ML ~~LOC~~ SOLN
20.0000 [IU] | Freq: Every day | SUBCUTANEOUS | Status: DC
  Administered 2017-06-25: 20 [IU] via SUBCUTANEOUS
  Filled 2017-06-25 (×3): qty 0.2

## 2017-06-25 MED ORDER — KETOROLAC TROMETHAMINE 15 MG/ML IJ SOLN
15.0000 mg | Freq: Four times a day (QID) | INTRAMUSCULAR | Status: AC
  Administered 2017-06-25 – 2017-06-26 (×5): 15 mg via INTRAVENOUS
  Filled 2017-06-25 (×5): qty 1

## 2017-06-25 MED ORDER — ASPIRIN EC 325 MG PO TBEC
325.0000 mg | DELAYED_RELEASE_TABLET | Freq: Every day | ORAL | Status: AC
  Administered 2017-06-25: 325 mg via ORAL
  Filled 2017-06-25: qty 1

## 2017-06-25 MED ORDER — ALBUMIN HUMAN 5 % IV SOLN
12.5000 g | Freq: Once | INTRAVENOUS | Status: AC
  Administered 2017-06-25: 12.5 g via INTRAVENOUS
  Filled 2017-06-25: qty 250

## 2017-06-25 MED ORDER — FUROSEMIDE 10 MG/ML IJ SOLN
20.0000 mg | Freq: Once | INTRAMUSCULAR | Status: AC
  Administered 2017-06-25: 20 mg via INTRAVENOUS
  Filled 2017-06-25: qty 2

## 2017-06-25 NOTE — Progress Notes (Addendum)
T[facility] DAILY ICU PROGRESS NOTE                   [address]            [place], [address]          [phone]   1 Day Post-Op Procedure(s) (LRB): CORONARY ARTERY BYPASS GRAFTING (CABG) times two utilizing left anterior mammary artery and right greater saphenous vein harvested endoscopically (N/A) TRANSESOPHAGEAL ECHOCARDIOGRAM (TEE) (N/A) CLIPPING OF ATRIAL APPENDAGE (N/A) MAZE (N/A)  Total Length of Stay:  LOS: 1 day   Subjective:  Patient with multiple complaints this morning.  He is having a lot of pain which has improved some, but has persisted all evening.  He had some mild nausea, which resolved, but he states he has no desire to eat.  Objective: Vital signs in last 24 hours: Temp:  [97.3 F (36.3 C)-98.2 F (36.8 C)] 98.2 F (36.8 C) (02/05 0730) Pulse Rate:  [0-122] 44 (02/05 0730) Cardiac Rhythm: Atrial paced (02/05 0400) Resp:  [0-62] 24 (02/05 0730) BP: (83-136)/(57-98) 92/67 (02/05 0700) SpO2:  [0 %-100 %] 99 % (02/05 0730) Arterial Line BP: (79-135)/(46-69) 111/64 (02/05 0730) FiO2 (%):  [40 %-50 %] 40 % (02/04 2159) Weight:  [238 lb 12.1 oz (108.3 kg)] 238 lb 12.1 oz (108.3 kg) (02/05 0500)  Filed Weights   06/25/17 0500  Weight: 238 lb 12.1 oz (108.3 kg)    Weight change:    Hemodynamic parameters for last 24 hours: PAP: (23-36)/(9-25) 30/17 CO:  [2.4 L/min-4.2 L/min] 3.7 L/min CI:  [1.1 L/min/m2-1.8 L/min/m2] 1.6 L/min/m2  Intake/Output from previous day: 02/04 0701 - 02/05 0700 In: 4641.2 [I.V.:3241.2; IV Piggyback:1400] Out: 7169 [Urine:3175; Blood:700; Chest Tube:500]  Current Meds: Scheduled Meds: . acetaminophen  1,000 mg Oral Q6H  . aspirin EC  325 mg Oral Daily  . [START ON 06/26/2017] aspirin EC  325 mg Oral Daily  . bisacodyl  10 mg Oral Daily   Or  . bisacodyl  10 mg Rectal Daily  . docusate sodium  200 mg Oral Daily  . dofetilide  500 mcg Oral BID  . insulin aspart  0-24 Units Subcutaneous Q4H  . insulin detemir   20 Units Subcutaneous Daily  . insulin regular  0-10 Units Intravenous TID WC  . ketorolac  15 mg Intravenous Q6H  . mouth rinse  15 mL Mouth Rinse BID  . [START ON 06/26/2017] pantoprazole  40 mg Oral Daily  . sodium chloride flush  3 mL Intravenous Q12H   Continuous Infusions: . sodium chloride    . albumin human    . cefUROXime (ZINACEF)  IV Stopped (06/25/17 0039)  . famotidine (PEPCID) IV Stopped (06/24/17 2223)  . insulin (NOVOLIN-R) infusion 2.1 Units/hr (06/25/17 0700)  . lactated ringers    . lactated ringers Stopped (06/24/17 1700)  . lactated ringers 20 mL/hr at 06/25/17 0311  . phenylephrine (NEO-SYNEPHRINE) Adult infusion 25 mcg/min (06/25/17 0700)   PRN Meds:.lactated ringers, metoprolol tartrate, midazolam, morphine injection, ondansetron (ZOFRAN) IV, oxyCODONE, sodium chloride flush, traMADol  General appearance: alert, cooperative and no distress Heart: regular rate and rhythm and paced Lungs: clear to auscultation bilaterally Abdomen: soft, non-tender; bowel sounds normal; no masses,  no organomegaly Extremities: edema trace-1+ Wound: clean and dry, aquacel in place on sternotomy  Lab Results: CBC: Recent Labs    06/24/17 1653 06/24/17 1700 06/25/17 0351  WBC 29.0*  --  21.9*  HGB 15.7 15.0 12.9*  HCT 45.9 44.0  38.4*  PLT 126*  --  125*   BMET:  Recent Labs    06/24/17 0915  06/24/17 1513 06/24/17 1700 06/25/17 0351  NA 137   < > 135 140 141  K 4.5   < > 5.3* 4.5 4.3  CL 104   < > 102  --  110  CO2 19*  --   --   --  22  GLUCOSE 172*   < > 169* 158* 127*  BUN 16   < > 13  --  13  CREATININE 0.99   < > 0.60*  --  0.75  CALCIUM 9.4  --   --   --  8.2*   < > = values in this interval not displayed.    CMET: Lab Results  Component Value Date   WBC 21.9 (H) 06/25/2017   HGB 12.9 (L) 06/25/2017   HCT 38.4 (L) 06/25/2017   PLT 125 (L) 06/25/2017   GLUCOSE 127 (H) 06/25/2017   ALT 25 06/24/2017   AST 41 06/24/2017   NA 141 06/25/2017   K 4.3  06/25/2017   CL 110 06/25/2017   CREATININE 0.75 06/25/2017   BUN 13 06/25/2017   CO2 22 06/25/2017   TSH 2.850 12/14/2016   INR 1.32 06/24/2017      PT/INR:  Recent Labs    06/24/17 1653  LABPROT 16.3*  INR 1.32   Radiology: Dg Chest Port 1 View  Result Date: 06/25/2017 CLINICAL DATA:  CABG yesterday. EXAM: PORTABLE CHEST 1 VIEW COMPARISON:  One-view chest x-ray 06/24/2017. FINDINGS: Patient has been extubated. The tip of the Swan-Ganz catheter is stable position within the main pulmonary outflow tract. Mediastinal drain and bilateral chest tubes are in place. There is no pneumothorax. Heart size is exaggerated by low lung volumes. There is no significant edema. A small left effusion is suspected. CABG and atrial clip are noted. IMPRESSION: 1. Interval extubation. 2. Support apparatus is stable. 3. Low lung volumes and mild cardiomegaly. 4. No significant edema. 5. Small left pleural effusion. Electronically Signed   By: San Morelle M.D.   On: 06/25/2017 07:25   Dg Chest Port 1 View  Result Date: 06/24/2017 CLINICAL DATA:  Post op film for CABG today, check support apparatus. EXAM: PORTABLE CHEST 1 VIEW COMPARISON:  10/22/2016 FINDINGS: Midline sternotomy. Endotracheal tube 5.7 cm from carina. NG tube extends distal esophagus. Swan-Ganz catheter with tip in the main pulmonary artery. LEFT chest tube and mediastinal drain noted. No pneumothorax. No pulmonary edema. IMPRESSION: 1. Support apparatus in appropriate position. 2. No pulmonary edema or pneumothorax. Electronically Signed   By: Suzy Bouchard M.D.   On: 06/24/2017 17:29     Assessment/Plan: S/P Procedure(s) (LRB): CORONARY ARTERY BYPASS GRAFTING (CABG) times two utilizing left anterior mammary artery and right greater saphenous vein harvested endoscopically (N/A) TRANSESOPHAGEAL ECHOCARDIOGRAM (TEE) (N/A) CLIPPING OF ATRIAL APPENDAGE (N/A) MAZE (N/A)  1. CV- H/O A. Fib, S/P MAZE, Sinus Brady under pacer, BP is  labile- on Neo which we will wean as hemodynamics allow, restart home Tikosyn, d/c balloon pump today 2. Pulm- wean oxygen as tolerated, CXR with small left pleural effusion 3. Renal-creatinine WNL, + edema, will hold on Lasix for now 4. Expected post operative blood loss anemia, mild at 12.9 5. Expected post operative thrombocytopenia- at 125K 6. CBGs controlled 7. Dispo- patient stable, d/c IAP, maintaining NSR restart home Tikosyn, wean Neo as tolerated, POD #1 progress orders      Ellwood Handler 06/25/2017 8:19  AM    I have seen and examined the patient and agree with the assessment and plan as outlined.  Wean and D/C IABP.  Mobilize once IABP out.  Rexene Alberts, MD 06/25/2017 8:30 AM

## 2017-06-25 NOTE — Progress Notes (Signed)
TCTS BRIEF SICU PROGRESS NOTE  1 Day Post-Op  S/P Procedure(s) (LRB): CORONARY ARTERY BYPASS GRAFTING (CABG) times two utilizing left anterior mammary artery and right greater saphenous vein harvested endoscopically (N/A) TRANSESOPHAGEAL ECHOCARDIOGRAM (TEE) (N/A) CLIPPING OF ATRIAL APPENDAGE (N/A) MAZE (N/A)   Stable day IABP removed uneventfully Maintaining NSR w/ stable BP off all drips Breathing comfortably w/ O2 sats 95-96% on 4 L/min UOP trending down  Plan: Mobilize once IABP has been out x 6 hours.  Try one dose lasix to stimulate UOP  Rexene Alberts, MD 06/25/2017 5:54 PM

## 2017-06-25 NOTE — Progress Notes (Signed)
IABP pulled from right femoral artery and pressure held x 25 mins.  Site looks good with no hematoma.  Level 0.  Vitals stable throughout sheath removal.  1+ distal pulse right dp.  Site dressed with 4x4 and tegaderm.  Instructions for bed rest explained to patient.  RN will continue to monitor site.

## 2017-06-26 ENCOUNTER — Inpatient Hospital Stay (HOSPITAL_COMMUNITY): Payer: Medicare Other

## 2017-06-26 LAB — TYPE AND SCREEN
ABO/RH(D): O POS
ANTIBODY SCREEN: NEGATIVE
UNIT DIVISION: 0
UNIT DIVISION: 0
Unit division: 0
Unit division: 0

## 2017-06-26 LAB — BPAM RBC
BLOOD PRODUCT EXPIRATION DATE: 201902212359
BLOOD PRODUCT EXPIRATION DATE: 201902252359
Blood Product Expiration Date: 201902252359
Blood Product Expiration Date: 201903022359
ISSUE DATE / TIME: 201902051040
ISSUE DATE / TIME: 201902041141
ISSUE DATE / TIME: 201902041141
ISSUE DATE / TIME: 201902051040
UNIT TYPE AND RH: 5100
UNIT TYPE AND RH: 5100
UNIT TYPE AND RH: 5100
Unit Type and Rh: 5100

## 2017-06-26 LAB — BASIC METABOLIC PANEL
Anion gap: 10 (ref 5–15)
BUN: 17 mg/dL (ref 6–20)
CALCIUM: 8.5 mg/dL — AB (ref 8.9–10.3)
CHLORIDE: 102 mmol/L (ref 101–111)
CO2: 23 mmol/L (ref 22–32)
CREATININE: 0.92 mg/dL (ref 0.61–1.24)
Glucose, Bld: 141 mg/dL — ABNORMAL HIGH (ref 65–99)
Potassium: 4.4 mmol/L (ref 3.5–5.1)
SODIUM: 135 mmol/L (ref 135–145)

## 2017-06-26 LAB — CBC
HCT: 35.9 % — ABNORMAL LOW (ref 39.0–52.0)
Hemoglobin: 11.7 g/dL — ABNORMAL LOW (ref 13.0–17.0)
MCH: 30.5 pg (ref 26.0–34.0)
MCHC: 32.6 g/dL (ref 30.0–36.0)
MCV: 93.7 fL (ref 78.0–100.0)
PLATELETS: 102 10*3/uL — AB (ref 150–400)
RBC: 3.83 MIL/uL — AB (ref 4.22–5.81)
RDW: 13.7 % (ref 11.5–15.5)
WBC: 21.1 10*3/uL — AB (ref 4.0–10.5)

## 2017-06-26 LAB — GLUCOSE, CAPILLARY
GLUCOSE-CAPILLARY: 143 mg/dL — AB (ref 65–99)
GLUCOSE-CAPILLARY: 139 mg/dL — AB (ref 65–99)
GLUCOSE-CAPILLARY: 130 mg/dL — AB (ref 65–99)
GLUCOSE-CAPILLARY: 125 mg/dL — AB (ref 65–99)
Glucose-Capillary: 130 mg/dL — ABNORMAL HIGH (ref 65–99)

## 2017-06-26 MED ORDER — FUROSEMIDE 10 MG/ML IJ SOLN
20.0000 mg | Freq: Four times a day (QID) | INTRAMUSCULAR | Status: AC
  Administered 2017-06-26 (×3): 20 mg via INTRAVENOUS
  Filled 2017-06-26 (×3): qty 2

## 2017-06-26 MED ORDER — POTASSIUM CHLORIDE CRYS ER 20 MEQ PO TBCR
20.0000 meq | EXTENDED_RELEASE_TABLET | Freq: Two times a day (BID) | ORAL | Status: DC
  Administered 2017-06-26 – 2017-06-28 (×5): 20 meq via ORAL
  Filled 2017-06-26 (×5): qty 1

## 2017-06-26 MED ORDER — FUROSEMIDE 40 MG PO TABS: 40.0000 mg | ORAL_TABLET | Freq: Every day | ORAL | Status: DC

## 2017-06-26 MED ORDER — MOVING RIGHT ALONG BOOK
Freq: Once | Status: AC
  Administered 2017-06-26: 06:00:00
  Filled 2017-06-26: qty 1

## 2017-06-26 MED ORDER — INSULIN ASPART 100 UNIT/ML ~~LOC~~ SOLN
0.0000 [IU] | Freq: Three times a day (TID) | SUBCUTANEOUS | Status: DC
  Administered 2017-06-26 – 2017-06-28 (×8): 2 [IU] via SUBCUTANEOUS

## 2017-06-26 MED ORDER — METOPROLOL TARTRATE 12.5 MG HALF TABLET
12.5000 mg | ORAL_TABLET | Freq: Two times a day (BID) | ORAL | Status: DC
  Administered 2017-06-26 – 2017-06-28 (×5): 12.5 mg via ORAL
  Filled 2017-06-26 (×5): qty 1

## 2017-06-26 NOTE — Progress Notes (Signed)
CT surgery p.m. Rounds  Stable day Mobilize out of bed Sinus rhythm Stable hemodynamics Resting comfortably

## 2017-06-26 NOTE — Progress Notes (Addendum)
      [address]       [place] [address]             [phone]        CARDIOTHORACIC SURGERY PROGRESS NOTE   R2 Days Post-Op Procedure(s) (LRB): CORONARY ARTERY BYPASS GRAFTING (CABG) times two utilizing left anterior mammary artery and right greater saphenous vein harvested endoscopically (N/A) TRANSESOPHAGEAL ECHOCARDIOGRAM (TEE) (N/A) CLIPPING OF ATRIAL APPENDAGE (N/A) MAZE (N/A)  Subjective: Feels pretty well.  Slept some overnight.  Mild soreness in chest.  No SOB  Objective: Vital signs: BP Readings from Last 1 Encounters:  06/26/17 117/77   Pulse Readings from Last 1 Encounters:  06/26/17 87   Resp Readings from Last 1 Encounters:  06/26/17 (!) 26   Temp Readings from Last 1 Encounters:  06/26/17 97.7 F (36.5 C) (Oral)    Hemodynamics: PAP: (30-36)/(17-22) 31/17 CO:  [3.9 L/min] 3.9 L/min CI:  [1.7 L/min/m2] 1.7 L/min/m2  Physical Exam:  Rhythm:   Sinus w/ PACs  Breath sounds: clear  Heart sounds:  RRR w/out murmur  Incisions:  Clean and dry  Abdomen:  Soft, non-distended, non-tender  Extremities:  Warm, well-perfused    Intake/Output from previous day: 02/05 0701 - 02/06 0700 In: 888.4 [P.O.:200; I.V.:538.4; IV Piggyback:150] Out: 1660 [Urine:1240; Chest Tube:420] Intake/Output this shift: Total I/O In: 320 [P.O.:300; I.V.:20] Out: 75 [Urine:75]  Lab Results:  CBC: Recent Labs    06/25/17 1720 06/25/17 1728 06/26/17 0410  WBC 22.6*  --  21.1*  HGB 12.0* 11.9* 11.7*  HCT 36.4* 35.0* 35.9*  PLT 112*  --  102*    BMET:  Recent Labs    06/25/17 0351  06/25/17 1728 06/26/17 0410  NA 141  --  140 135  K 4.3  --  4.6 4.4  CL 110  --  104 102  CO2 22  --   --  23  GLUCOSE 127*  --  128* 141*  BUN 13  --  14 17  CREATININE 0.75   < > 0.60* 0.92  CALCIUM 8.2*  --   --  8.5*   < > = values in this interval not displayed.     PT/INR:   Recent Labs    06/24/17 1653  LABPROT 16.3*  INR 1.32    CBG (last  3)  Recent Labs    06/25/17 2024 06/25/17 2319 06/26/17 0759  GLUCAP 127* 146* 143*    ABG    Component Value Date/Time   PHART 7.369 06/25/2017 0204   PCO2ART 35.9 06/25/2017 0204   PO2ART 82.0 (L) 06/25/2017 0204   HCO3 20.8 06/25/2017 0204   TCO2 21 (L) 06/25/2017 1728   ACIDBASEDEF 4.0 (H) 06/25/2017 0204   O2SAT 96.0 06/25/2017 0204    CXR: Looks good  Assessment/Plan: S/P Procedure(s) (LRB): CORONARY ARTERY BYPASS GRAFTING (CABG) times two utilizing left anterior mammary artery and right greater saphenous vein harvested endoscopically (N/A) TRANSESOPHAGEAL ECHOCARDIOGRAM (TEE) (N/A) CLIPPING OF ATRIAL APPENDAGE (N/A) MAZE (N/A)  Doing well POD2 Maintaining NSR w/ stable BP Breathing comfortably w/ O2 sats 97% on RA Expected post op acute blood loss anemia, mild, stable Expected post op atelectasis, very mild Expected post op volume excess, weight reportedly 7 lbs > preop, UOP adequate Leukocytosis w/out fever, likely reactive, trending down   Mobilize  Gentle diuresis  Continue Tikosyn, restart low dose beta blocker  Transfer step down   Rexene Alberts, MD 06/26/2017 9:07 AM

## 2017-06-26 NOTE — Care Management Note (Signed)
Case Management Note  Patient Details  Name: Hector Clark MRN: [id number] Date of Birth: 16-Jun-1945  Subjective/Objective:  from home with wife who will be with him 24/7 .  PTA indep, POD 2 CABG, clipping of atrial appendage.  He did not use any DME pta, he has PCP and medication coverage.  He ambulated 1 lap around the unit today.                  Action/Plan: NCM will follow for dc needs.   Expected Discharge Date:  07/02/17               Expected Discharge Plan:  Home/Self Care  In-House Referral:     Discharge planning Services  CM Consult  Post Acute Care Choice:    Choice offered to:     DME Arranged:    DME Agency:     HH Arranged:    HH Agency:     Status of Service:  In process, will continue to follow  If discussed at Long Length of Stay Meetings, dates discussed:    Additional Comments:  Zenon Mayo, RN 06/26/2017, 1:40 PM

## 2017-06-27 ENCOUNTER — Inpatient Hospital Stay (HOSPITAL_COMMUNITY): Payer: Medicare Other

## 2017-06-27 DIAGNOSIS — Z951 Presence of aortocoronary bypass graft: Secondary | ICD-10-CM

## 2017-06-27 LAB — BASIC METABOLIC PANEL
ANION GAP: 10 (ref 5–15)
BUN: 24 mg/dL — ABNORMAL HIGH (ref 6–20)
CHLORIDE: 102 mmol/L (ref 101–111)
CO2: 24 mmol/L (ref 22–32)
CREATININE: 0.95 mg/dL (ref 0.61–1.24)
Calcium: 8.7 mg/dL — ABNORMAL LOW (ref 8.9–10.3)
GFR calc Af Amer: >60 mL/min (ref >60)
GFR calc non Af Amer: >60 mL/min (ref >60)
Glucose, Bld: 137 mg/dL — ABNORMAL HIGH (ref 65–99)
POTASSIUM: 4.6 mmol/L (ref 3.5–5.1)
SODIUM: 136 mmol/L (ref 135–145)

## 2017-06-27 LAB — GLUCOSE, CAPILLARY
GLUCOSE-CAPILLARY: 147 mg/dL — AB (ref 65–99)
GLUCOSE-CAPILLARY: 115 mg/dL — AB (ref 65–99)
Glucose-Capillary: 131 mg/dL — ABNORMAL HIGH (ref 65–99)
Glucose-Capillary: 144 mg/dL — ABNORMAL HIGH (ref 65–99)

## 2017-06-27 LAB — CBC
HEMATOCRIT: 35.7 % — AB (ref 39.0–52.0)
HEMOGLOBIN: 11.5 g/dL — AB (ref 13.0–17.0)
MCH: 30.2 pg (ref 26.0–34.0)
MCHC: 32.2 g/dL (ref 30.0–36.0)
MCV: 93.7 fL (ref 78.0–100.0)
Platelets: 106 10*3/uL — ABNORMAL LOW (ref 150–400)
RBC: 3.81 MIL/uL — AB (ref 4.22–5.81)
RDW: 13.5 % (ref 11.5–15.5)
WBC: 17.1 10*3/uL — ABNORMAL HIGH (ref 4.0–10.5)

## 2017-06-27 MED ORDER — ASPIRIN EC 81 MG PO TBEC
81.0000 mg | DELAYED_RELEASE_TABLET | Freq: Every day | ORAL | Status: DC
  Administered 2017-06-28: 81 mg via ORAL
  Filled 2017-06-27: qty 1

## 2017-06-27 MED ORDER — ATORVASTATIN CALCIUM 80 MG PO TABS
80.0000 mg | ORAL_TABLET | Freq: Every day | ORAL | Status: DC
  Administered 2017-06-27: 80 mg via ORAL
  Filled 2017-06-27: qty 1

## 2017-06-27 MED ORDER — FUROSEMIDE 40 MG PO TABS
40.0000 mg | ORAL_TABLET | Freq: Every day | ORAL | Status: DC
  Administered 2017-06-28: 40 mg via ORAL
  Filled 2017-06-27: qty 1

## 2017-06-27 MED ORDER — FUROSEMIDE 10 MG/ML IJ SOLN
40.0000 mg | Freq: Once | INTRAMUSCULAR | Status: AC
  Administered 2017-06-27: 40 mg via INTRAVENOUS
  Filled 2017-06-27: qty 4

## 2017-06-27 MED FILL — Electrolyte-R (PH 7.4) Solution: INTRAVENOUS | Qty: 4000 | Status: AC

## 2017-06-27 MED FILL — Lidocaine HCl IV Inj 20 MG/ML: INTRAVENOUS | Qty: 5 | Status: AC

## 2017-06-27 MED FILL — Heparin Sodium (Porcine) Inj 1000 Unit/ML: INTRAMUSCULAR | Qty: 10 | Status: AC

## 2017-06-27 MED FILL — Sodium Bicarbonate IV Soln 8.4%: INTRAVENOUS | Qty: 100 | Status: AC

## 2017-06-27 MED FILL — Sodium Chloride IV Soln 0.9%: INTRAVENOUS | Qty: 3000 | Status: AC

## 2017-06-27 MED FILL — Mannitol IV Soln 20%: INTRAVENOUS | Qty: 500 | Status: AC

## 2017-06-27 NOTE — Progress Notes (Signed)
Came to ambulate however pt just to bed and wants to rest. Family and pt asked many questions regarding CRPII, plan/process of recovery, and initial MI. Long discussion, gave MI book and CRPII brochure. They are deciding what facility to go to. Encouraged x1 more walk this pm, IS, sitting in recliner. Will f/u in am. [place], ACSM 3:31 PM 06/27/2017

## 2017-06-27 NOTE — Progress Notes (Signed)
Pt arrived from 2 Heart.  Pt oriented to room and staff.  Vitals obtained.  Telemetry box applied.  CCMD notified x 2.  CHG bath completed.  Pt denies needs.  Will continue current plan of care.   Anson Crofts RN

## 2017-06-27 NOTE — Discharge Summary (Signed)
Physician Discharge Summary  Patient ID: Hector Clark MRN: [id number] DOB/AGE: [birth year] 72 y.o.  Admit date: 06/24/2017 Discharge date: 06/28/2017  Admission Diagnoses:  Patient Active Problem List   Diagnosis Date Noted  . Non-ST elevation (NSTEMI) myocardial infarction ([place]) 06/24/2017  . Atypical chest pain 12/15/2016  . Persistent atrial fibrillation ([place]) 12/15/2016   Discharge Diagnoses:   Patient Active Problem List   Diagnosis Date Noted  . Non-ST elevation (NSTEMI) myocardial infarction ([place]) 06/24/2017  . S/P emergency CABG x 2 + maze procedure 06/24/2017  . S/P Maze operation for atrial fibrillation 06/24/2017  . Atypical chest pain 12/15/2016  . Persistent atrial fibrillation ([place]) 12/15/2016   Discharged Condition: good  History of Present Illness:  Mr. Hector Clark is a 72 yo male with history of atrial fibrillation and hyperlipidemia who has been referred for emergent surgical consultation due to the discovery of critical left main coronary artery disease in the setting of acute coronary syndrome and possible acute evolving ST segment elevation myocardial infarction.  The patient's cardiac history dates back to July 2018 when he was initially evaluated for symptoms of chest pain.  Symptoms were reported as episodic and not necessarily associated with activity.  The patient did describe associated symptoms of exertional shortness of breath.  He was noted to be in atrial fibrillation at the time.  A stress test and echocardiogram were performed.  Nuclear medicine stress test was felt to be low risk.  Echocardiogram revealed normal left ventricular systolic function.  The patient was referred to the atrial fibrillation clinic and has been followed there intermittently ever since.  He has recently been anticoagulated using Xarelto.  He previously was treated with flecainide but failed to maintain sinus rhythm.  Last week he was hospitalized for Tikosyn loading.  He initially  converted to sinus rhythm but had recurrent A. fib.  He was taken for DC cardioversion but spontaneously converted to sinus rhythm with induction of anesthesia and cardioversion was canceled.  The patient was discharged home.  Early this morning he woke from his sleep having crushing substernal chest pain.  He presented to the emergency room where he was initially in atrial fibrillation.  In the emergency department he spontaneously converted to sinus rhythm but had ST segment depression concerning for possible acute myocardial ischemia.  He was brought directly to the cardiac Cath Lab where he was found to have critical left main coronary artery disease.  Intra-aortic balloon pump was placed and emergent cardiothoracic surgical consultation requested.  He was evaluated by Dr. Roxy Clark who was in agreement the patient would require bypass procedure.  The risks and benefits of the procedure were explained to the patient and he was agreeable to proceed.  Hospital Course:   He was taken to the operating room and underwent Emergent CABG x 2 utilizing LIMA to LAD, SVG to OM, and Complete MAZE procedure.  The patient tolerated the procedure without difficulty and was taken to the SICU in stable condition.  He was extubated the evening of surgery.  During his stay in the SICU his arterial lines and chest tubes were removed without difficulty.  His balloon pump was removed on POD #1.  He was restarted on his home regimen of Tikosyn.  He was started on low dose BP.  He was treated with Lasix for gentle diuresis.  He was maintaining NSR and he was felt to be medically stable for transfer to the step down unit on 06/28/2017.  The patient remains clinically stable.  He remains in NSR.  His pacing wires were removed without difficulty.  He will be restarted on his Xarelto at discharge.  He is ambulating with minimal difficulty. His incisions are healing without evidence of infection.  He is tolerating a diet.  He is medically  stable for discharge home today.        Significant Diagnostic Studies: angiography:    Prox RCA lesion is 25% stenosed.  Mid RCA lesion is 30% stenosed.  Mid LM lesion is 99% stenosed.  Ost Cx to Prox Cx lesion is 99% stenosed.  LV end diastolic pressure is moderately elevated.  There is moderate left ventricular systolic dysfunction.  The left ventricular ejection fraction is 35-45% by visual estimate.   1. Critical left main and LCx occlusive CAD  2. Moderate LV dysfunction 3. Moderately elevated LVEDP   Treatments: surgery:    Coronary Artery Bypass Grafting x 2              Left Internal Mammary Artery to Distal Left Anterior Descending Coronary Artery             Saphenous Vein Graft to Obtuse Marginal Branch of Left Circumflex Coronary Artery             Endoscopic Vein Harvest from Right Thigh    Maze Procedure              Left atrial lesion set using bipolar radiofrequency and cryothermy ablation             Clipping of left atrial appendage (Atriclip size 51mm  Disposition: 01-Home or Self Care   Discharge Medications:  The patient has been discharged on:   1.Beta Blocker:  Yes [ x  ]                              No   [   ]                              If No, reason:  2.Ace Inhibitor/ARB: Yes [   ]                                     No  [ x   ]                                     If No, reason: labile BP  3.Statin:   Yes [ x  ]                  No  [   ]                  If No, reason:  4.Ecasa:  Yes  [x  ]                  No   [   ]                  If No, reason:    Allergies as of 06/28/2017   No Known Allergies     Medication List    TAKE these medications   acetaminophen 500 MG tablet Commonly known as:  TYLENOL Take 2 tablets (1,000  mg total) by mouth every 6 (six) hours as needed.   aspirin 81 MG EC tablet Take 1 tablet (81 mg total) by mouth daily.   atorvastatin 80 MG tablet Commonly known as:  LIPITOR Take 1 tablet  (80 mg total) by mouth daily at 6 PM.   dofetilide 500 MCG capsule Commonly known as:  TIKOSYN Take 1 capsule (500 mcg total) by mouth 2 (two) times daily.   furosemide 40 MG tablet Commonly known as:  LASIX Take 1 tablet (40 mg total) by mouth daily.   metoprolol succinate 25 MG 24 hr tablet Commonly known as:  TOPROL XL Take 1 tablet (25 mg total) by mouth daily. Ok to take an extra 1/2 tablet prn for afib What changed:    how much to take  when to take this  additional instructions   potassium chloride SA 20 MEQ tablet Commonly known as:  K-DUR,KLOR-CON Take 1 tablet (20 mEq total) by mouth daily.   rivaroxaban 20 MG Tabs tablet Commonly known as:  XARELTO Take 1 tablet (20 mg total) by mouth daily with supper. What changed:  when to take this   traMADol 50 MG tablet Commonly known as:  ULTRAM Take 1 tablet (50 mg total) by mouth every 4 (four) hours as needed for moderate pain.      Follow-up Information    [facility] [place] Follow up on 07/29/2017.   Specialty:  Cardiothoracic Surgery Why:  Appointment is at 1:00, please get CXR at 12:30 at [facility] located on first floor of our office building Contact information: [place], [place], [place] Follow up on 07/12/2017.   Specialty:  Cardiology Why:  Appointment is at 8:00 Contact information: [address] [place] [address] [phone]           Signed: Ellwood Handler 06/28/2017, 7:52 AM

## 2017-06-27 NOTE — Progress Notes (Signed)
      [address]       [place] [place] [address]             [phone]        CARDIOTHORACIC SURGERY PROGRESS NOTE   R3 Days Post-Op Procedure(s) (LRB): CORONARY ARTERY BYPASS GRAFTING (CABG) times two utilizing left anterior mammary artery and right greater saphenous vein harvested endoscopically (N/A) TRANSESOPHAGEAL ECHOCARDIOGRAM (TEE) (N/A) CLIPPING OF ATRIAL APPENDAGE (N/A) MAZE (N/A)  Subjective: Looks good.  Mild soreness in chest.  Slept well.  Marginal appetite.  No BM yet but passing flatus  Objective: Vital signs: BP Readings from Last 1 Encounters:  06/27/17 108/73   Pulse Readings from Last 1 Encounters:  06/27/17 71   Resp Readings from Last 1 Encounters:  06/27/17 15   Temp Readings from Last 1 Encounters:  06/26/17 98.6 F (37 C) (Oral)    Hemodynamics:    Physical Exam:  Rhythm:   NSR  Breath sounds: clear  Heart sounds:  RRR  Incisions:  Clean and dry  Abdomen:  Soft, non-distended, non-tender  Extremities:  Warm, well-perfused    Intake/Output from previous day: 02/06 0701 - 02/07 0700 In: 6237 [P.O.:1200; I.V.:90; IV Piggyback:50] Out: 875 [Urine:875] Intake/Output this shift: No intake/output data recorded.  Lab Results:  CBC: Recent Labs    06/26/17 0410 06/27/17 0320  WBC 21.1* 17.1*  HGB 11.7* 11.5*  HCT 35.9* 35.7*  PLT 102* 106*    BMET:  Recent Labs    06/26/17 0410 06/27/17 0320  NA 135 136  K 4.4 4.6  CL 102 102  CO2 23 24  GLUCOSE 141* 137*  BUN 17 24*  CREATININE 0.92 0.95  CALCIUM 8.5* 8.7*     PT/INR:   Recent Labs    06/24/17 1653  LABPROT 16.3*  INR 1.32    CBG (last 3)  Recent Labs    06/26/17 1134 06/26/17 1741 06/26/17 2211  GLUCAP 139* 130* 125*    ABG    Component Value Date/Time   PHART 7.369 06/25/2017 0204   PCO2ART 35.9 06/25/2017 0204   PO2ART 82.0 (L) 06/25/2017 0204   HCO3 20.8 06/25/2017 0204   TCO2 21 (L) 06/25/2017 1728   ACIDBASEDEF 4.0 (H)  06/25/2017 0204   O2SAT 96.0 06/25/2017 0204    CXR: CHEST  2 VIEW  COMPARISON:  06/26/2017  FINDINGS: Right jugular central venous catheter tip removed. Bilateral chest tubes removed. No pneumothorax.  Mild bibasilar atelectasis. Negative for edema. Small bilateral pleural effusions.  IMPRESSION: Mild bibasilar atelectasis and effusion.   Electronically Signed   By: Franchot Gallo M.D.   On: 06/27/2017 07:44   Assessment/Plan: S/P Procedure(s) (LRB): CORONARY ARTERY BYPASS GRAFTING (CABG) times two utilizing left anterior mammary artery and right greater saphenous vein harvested endoscopically (N/A) TRANSESOPHAGEAL ECHOCARDIOGRAM (TEE) (N/A) CLIPPING OF ATRIAL APPENDAGE (N/A) MAZE (N/A)  Doing very well POD3 Maintaining NSR w/ stable BP Breathing comfortably w/ O2 sats 97% on RA Expected post op acute blood loss anemia, mild, stable Expected post op atelectasis, very mild Expected post op volume excess, weight up 1 lb and reportedly 8 lbs > preop, UOP adequate Leukocytosis w/out fever, likely reactive, trending down   Mobilize  Diuresis  Continue Tikosyn and low dose beta blocker  Awaiting bed for transfer to 4E  Possible d/c home 1-2 days  Restart Xarelto at d/c   Hector Alberts, MD 06/27/2017 7:58 AM

## 2017-06-27 NOTE — Progress Notes (Signed)
Pacing wires d/c per order, wires removed intact with no issues, vital signs monitored per protocol.

## 2017-06-28 ENCOUNTER — Ambulatory Visit (HOSPITAL_COMMUNITY): Payer: Medicare Other | Admitting: Nurse Practitioner

## 2017-06-28 LAB — BASIC METABOLIC PANEL
Anion gap: 11 (ref 5–15)
BUN: 23 mg/dL — AB (ref 6–20)
CHLORIDE: 99 mmol/L — AB (ref 101–111)
CO2: 25 mmol/L (ref 22–32)
Calcium: 8.5 mg/dL — ABNORMAL LOW (ref 8.9–10.3)
Creatinine, Ser: 0.88 mg/dL (ref 0.61–1.24)
Glucose, Bld: 136 mg/dL — ABNORMAL HIGH (ref 65–99)
Potassium: 4.5 mmol/L (ref 3.5–5.1)
SODIUM: 135 mmol/L (ref 135–145)

## 2017-06-28 LAB — CBC
HEMATOCRIT: 35 % — AB (ref 39.0–52.0)
Hemoglobin: 11.2 g/dL — ABNORMAL LOW (ref 13.0–17.0)
MCH: 30 pg (ref 26.0–34.0)
MCHC: 32 g/dL (ref 30.0–36.0)
MCV: 93.8 fL (ref 78.0–100.0)
Platelets: 142 10*3/uL — ABNORMAL LOW (ref 150–400)
RBC: 3.73 MIL/uL — AB (ref 4.22–5.81)
RDW: 13.4 % (ref 11.5–15.5)
WBC: 14.3 10*3/uL — AB (ref 4.0–10.5)

## 2017-06-28 LAB — GLUCOSE, CAPILLARY
Glucose-Capillary: 113 mg/dL — ABNORMAL HIGH (ref 65–99)
Glucose-Capillary: 144 mg/dL — ABNORMAL HIGH (ref 65–99)

## 2017-06-28 MED ORDER — FUROSEMIDE 40 MG PO TABS: 40.0000 mg | ORAL_TABLET | Freq: Every day | ORAL | 0 refills | Status: DC

## 2017-06-28 MED ORDER — ATORVASTATIN CALCIUM 80 MG PO TABS: 80.0000 mg | ORAL_TABLET | Freq: Every day | ORAL | 3 refills | Status: DC

## 2017-06-28 MED ORDER — ASPIRIN 81 MG PO TBEC: 81.0000 mg | DELAYED_RELEASE_TABLET | Freq: Every day | ORAL | Status: DC

## 2017-06-28 MED ORDER — ACETAMINOPHEN 500 MG PO TABS: 1000.0000 mg | ORAL_TABLET | Freq: Four times a day (QID) | ORAL | 0 refills | Status: DC | PRN

## 2017-06-28 MED ORDER — TRAMADOL HCL 50 MG PO TABS: 50.0000 mg | ORAL_TABLET | ORAL | 0 refills | Status: DC | PRN

## 2017-06-28 MED ORDER — POTASSIUM CHLORIDE CRYS ER 20 MEQ PO TBCR: 20.0000 meq | EXTENDED_RELEASE_TABLET | Freq: Every day | ORAL | 1 refills | Status: DC

## 2017-06-28 MED FILL — Heparin Sodium (Porcine) Inj 1000 Unit/ML: INTRAMUSCULAR | Qty: 30 | Status: AC

## 2017-06-28 MED FILL — Magnesium Sulfate Inj 50%: INTRAMUSCULAR | Qty: 10 | Status: AC

## 2017-06-28 MED FILL — Potassium Chloride Inj 2 mEq/ML: INTRAVENOUS | Qty: 40 | Status: AC

## 2017-06-28 NOTE — Care Management Note (Signed)
Case Management Note  Patient Details  Name: Hector Clark MRN: [id number] Date of Birth: [birth year]  Subjective/Objective:  from home with wife who will be with him 24/7 .  PTA indep, POD 2 CABG, clipping of atrial appendage.  He did not use any DME pta, he has PCP and medication coverage.  He ambulated 1 lap around the unit today.                  Action/Plan: NCM will follow for dc needs.   Expected Discharge Date:  06/28/17               Expected Discharge Plan:  Home/Self Care  In-House Referral:  NA  Discharge planning Services  CM Consult  Post Acute Care Choice:  NA Choice offered to:  NA  DME Arranged:    DME Agency:     HH Arranged:    HH Agency:     Status of Service:  Completed, signed off  If discussed at [employer] of Stay Meetings, dates discussed:    Discharge Disposition: home/self care   Additional Comments:   06/28/17- [place] RN, CM- pt for d/c home today- no CM needs noted for transition home.   Dahlia Client Bixby, RN 06/28/2017, 3:24 PM [phone] 4E Case Management

## 2017-06-28 NOTE — Care Management Important Message (Signed)
Important Message  Patient Details  Name: Hector Clark MRN: [id number] Date of Birth: 17-Jun-1945   Medicare Important Message Given:  Yes    Eloyse Causey Montine Circle 06/28/2017, 10:57 AM

## 2017-06-28 NOTE — Consult Note (Signed)
   [facility] CM Inpatient Consult   06/28/2017  Chanel Mckesson 1945/12/10 [id number]  Patient screened for potential [place] [facility] services. Patient is in the [place] of the [place] [facility] services under patient's Medicare plan. Spoke with inpatient RNCM, Steffanie Dunn and she states patient has no follow up needs at this time. Patient's primary care provider is Dr. Lavone Orn, [facility].  This office/provider is listed to provide the transition of care needs for post hospital follow up.  No [facility] Care Management needs at this time.   For questions contact:   Natividad Brood, RN BSN [facility] Liaison  [phone] business mobile phone Toll free office [phone]

## 2017-06-28 NOTE — Progress Notes (Signed)
      [address]       [place] [address]             [phone]      4 Days Post-Op Procedure(s) (LRB): CORONARY ARTERY BYPASS GRAFTING (CABG) times two utilizing left anterior mammary artery and right greater saphenous vein harvested endoscopically (N/A) TRANSESOPHAGEAL ECHOCARDIOGRAM (TEE) (N/A) CLIPPING OF ATRIAL APPENDAGE (N/A) MAZE (N/A)   Subjective:  No new complaints.  States he still notices he flips into A.Fib for brief period of time.  Wants to go home.   Objective: Vital signs in last 24 hours: Temp:  [97.8 F (36.6 C)-98.7 F (37.1 C)] 98.4 F (36.9 C) (02/08 0438) Pulse Rate:  [64-87] 70 (02/08 0438) Cardiac Rhythm: Normal sinus rhythm (02/08 0700) Resp:  [11-24] 12 (02/08 0438) BP: (94-122)/(58-78) 94/62 (02/08 0438) SpO2:  [93 %-100 %] 96 % (02/08 0438) Weight:  [234 lb 6.4 oz (106.3 kg)-237 lb 3.4 oz (107.6 kg)] 234 lb 6.4 oz (106.3 kg) (02/08 0438)  Intake/Output from previous day: 02/07 0701 - 02/08 0700 In: 640 [P.O.:640] Out: -   General appearance: alert, cooperative and no distress Heart: regular rate and rhythm Lungs: clear to auscultation bilaterally Abdomen: soft, non-tender; bowel sounds normal; no masses,  no organomegaly Extremities: edema trace Wound: clean and dry  Lab Results: Recent Labs    06/27/17 0320 06/28/17 0319  WBC 17.1* 14.3*  HGB 11.5* 11.2*  HCT 35.7* 35.0*  PLT 106* 142*   BMET:  Recent Labs    06/27/17 0320 06/28/17 0319  NA 136 135  K 4.6 4.5  CL 102 99*  CO2 24 25  GLUCOSE 137* 136*  BUN 24* 23*  CREATININE 0.95 0.88  CALCIUM 8.7* 8.5*    PT/INR: No results for input(s): LABPROT, INR in the last 72 hours. ABG    Component Value Date/Time   PHART 7.369 06/25/2017 0204   HCO3 20.8 06/25/2017 0204   TCO2 21 (L) 06/25/2017 1728   ACIDBASEDEF 4.0 (H) 06/25/2017 0204   O2SAT 96.0 06/25/2017 0204   CBG (last 3)  Recent Labs    06/27/17 1611 06/27/17 2056 06/28/17 0551    GLUCAP 144* 115* 113*    Assessment/Plan: S/P Procedure(s) (LRB): CORONARY ARTERY BYPASS GRAFTING (CABG) times two utilizing left anterior mammary artery and right greater saphenous vein harvested endoscopically (N/A) TRANSESOPHAGEAL ECHOCARDIOGRAM (TEE) (N/A) CLIPPING OF ATRIAL APPENDAGE (N/A) MAZE (N/A)  1. CV- H/O Atrial Fibrillation, maintaining NSR- continue Lopressor, Tikosyn per Dr. Rayann Heman, restart Xarelto at discharge 2. Pulm- no acute issues, continue IS 3. Renal- creatinine stable, weight is trending down, continue Lasix 4. DM-sugars controlled, not a diabetic 5. Expected blood loss anemia, mild Hgb at 11.2 6. Dispo- patient stable, will d/c home today  LOS: 4 days    Ellwood Handler 06/28/2017

## 2017-06-28 NOTE — Progress Notes (Signed)
Discharge at 4:19 pm with spouse and daughter.  Discharge summary and medication list reviewed with patient, daughter and spouse.  Written prescriptions given at discharge.  Prior to d/c: IV saline locks in L hand and L AC removed without difficulty.  Chest tube sutures removed x 4 w/o difficulty with 2 sutures located on right noted with scant clear drainage.  Steri-strips applied.  Pt assisted by staff to car.    Anson Crofts RN

## 2017-06-28 NOTE — Discharge Instructions (Signed)
1. Please wash incisions daily with soap and water.  Keep dry 2. Activity- ambulate at least 3 times per day, no lifting, pushing, pulling, over 8lbs for 6 weeks.Marland KitchenMarland KitchenNo strenuous activity 3. No driving for a minimum of 2 weeks and you must be off all narcotics 4. Diet- Heart Healthy 5. Fever of 101.5 please contact the office no source 6. If any questions or concerns arise please contact me at [phone]  Coronary Artery Bypass Grafting, Care After This sheet gives you information about how to care for yourself after your procedure. Your health care provider may also give you more specific instructions. If you have problems or questions, contact your health care provider. What can I expect after the procedure? After the procedure, it is common to have:  Nausea and a lack of appetite.  Constipation.  Weakness and fatigue.  Depression or irritability.  Pain or discomfort in your incision areas.  Follow these instructions at home: Medicines  Take over-the-counter and prescription medicines only as told by your health care provider. Do not stop taking medicines or start any new medicines without approval from your health care provider.  If you were prescribed an antibiotic medicine, take it as told by your health care provider. Do not stop taking the antibiotic even if you start to feel better.  Do not drive or use heavy machinery while taking prescription pain medicine. Incision care  Follow instructions from your health care provider about how to take care of your incisions. Make sure you: ? Wash your hands with soap and water before you change your bandage (dressing). If soap and water are not available, use hand sanitizer. ? Change your dressing as told by your health care provider. ? Leave stitches (sutures), skin glue, or adhesive strips in place. These skin closures may need to stay in place for 2 weeks or longer. If adhesive strip edges start to loosen and curl up, you may trim  the loose edges. Do not remove adhesive strips completely unless your health care provider tells you to do that.  Keep incision areas clean, dry, and protected.  Check your incision areas every day for signs of infection. Check for: ? More redness, swelling, or pain. ? More fluid or blood. ? Warmth. ? Pus or a bad smell.  If incisions were made in your legs: ? Avoid crossing your legs. ? Avoid sitting for long periods of time. Change positions every 30 minutes. ? Raise (elevate) your legs when you are sitting. Bathing  Do not take baths, swim, or use a hot tub until your health care provider approves.  Only take sponge baths. Pat the incisions dry. Do not rub incisions with a washcloth or towel.  Ask your health care provider when you can shower. Eating and drinking  Eat foods that are high in fiber, such as raw fruits and vegetables, whole grains, beans, and nuts. Meats should be lean cut. Avoid canned, processed, and fried foods. This can help prevent constipation and is a recommended part of a heart-healthy diet.  Drink enough fluid to keep your urine clear or pale yellow.  Limit alcohol intake to no more than 1 drink a day for nonpregnant women and 2 drinks a day for men. One drink equals 12 oz of beer, 5 oz of wine, or 1 oz of hard liquor. Activity  Rest and limit your activity as told by your health care provider. You may be instructed to: ? Stop any activity right away if you have chest  pain, shortness of breath, irregular heartbeats, or dizziness. Get help right away if you have any of these symptoms. ? Move around frequently for short periods or take short walks as directed by your health care provider. Gradually increase your activities. You may need physical therapy or cardiac rehabilitation to help strengthen your muscles and build your endurance. ? Avoid lifting, pushing, or pulling anything that is heavier than 10 lb (4.5 kg) for at least 6 weeks or as told by your  health care provider.  Do not drive until your health care provider approves.  Ask your health care provider when you may return to work.  Ask your health care provider when you may resume sexual activity. General instructions  Do not use any products that contain nicotine or tobacco, such as cigarettes and e-cigarettes. If you need help quitting, ask your health care provider.  Take 2-3 deep breaths every few hours during the day, while you recover. This helps expand your lungs and prevent complications like pneumonia after surgery.  If you were given a device called an incentive spirometer, use it several times a day to practice deep breathing. Support your chest with a pillow or your arms when you take deep breaths or cough.  Wear compression stockings as told by your health care provider. These stockings help to prevent blood clots and reduce swelling in your legs.  Weigh yourself every day. This helps identify if your body is holding (retaining) fluid that may make your heart and lungs work harder.  Keep all follow-up visits as told by your health care provider. This is important. Contact a health care provider if:  You have more redness, swelling, or pain around any incision.  You have more fluid or blood coming from any incision.  Any incision feels warm to the touch.  You have pus or a bad smell coming from any incision  You have a fever.  You have swelling in your ankles or legs.  You have pain in your legs.  You gain 2 lb (0.9 kg) or more a day.  You are nauseous or you vomit.  You have diarrhea. Get help right away if:  You have chest pain that spreads to your jaw or arms.  You are short of breath.  You have a fast or irregular heartbeat.  You notice a "clicking" in your breastbone (sternum) when you move.  You have numbness or weakness in your arms or legs.  You feel dizzy or light-headed. Summary  After the procedure, it is common to have pain or  discomfort in the incision areas.  Do not take baths, swim, or use a hot tub until your health care provider approves.  Gradually increase your activities. You may need physical therapy or cardiac rehabilitation to help strengthen your muscles and build your endurance.  Weigh yourself every day. This helps identify if your body is holding (retaining) fluid that may make your heart and lungs work harder. This information is not intended to replace advice given to you by your health care provider. Make sure you discuss any questions you have with your health care provider. Document Released: 11/24/2004 Document Revised: 03/26/2016 Document Reviewed: 03/26/2016 Elsevier Interactive Patient Education  2018 [employer].   Endoscopic Saphenous Vein Harvesting, Care After Refer to this sheet in the next few weeks. These instructions provide you with information about caring for yourself after your procedure. Your health care provider may also give you more specific instructions. Your treatment has been planned according  to current medical practices, but problems sometimes occur. Call your health care provider if you have any problems or questions after your procedure. What can I expect after the procedure? After the procedure, it is common to have:  Pain.  Bruising.  Swelling.  Numbness.  Follow these instructions at home: Medicine  Take over-the-counter and prescription medicines only as told by your health care provider.  Do not drive or operate heavy machinery while taking prescription pain medicine. Incision care   Follow instructions from your health care provider about how to take care of the cut made during surgery (incision). Make sure you: ? Wash your hands with soap and water before you change your bandage (dressing). If soap and water are not available, use hand sanitizer. ? Change your dressing as told by your health care provider. ? Leave stitches (sutures), skin glue, or  adhesive strips in place. These skin closures may need to be in place for 2 weeks or longer. If adhesive strip edges start to loosen and curl up, you may trim the loose edges. Do not remove adhesive strips completely unless your health care provider tells you to do that.  Check your incision area every day for signs of infection. Check for: ? More redness, swelling, or pain. ? More fluid or blood. ? Warmth. ? Pus or a bad smell. General instructions  Raise (elevate) your legs above the level of your heart while you are sitting or lying down.  Do any exercises your health care providers have given you. These may include deep breathing, coughing, and walking exercises.  Do not shower, take baths, swim, or use a hot tub unless told by your health care provider.  Wear your elastic stocking if told by your health care provider.  Keep all follow-up visits as told by your health care provider. This is important. Contact a health care provider if:  Medicine does not help your pain.  Your pain gets worse.  You have new leg bruises or your leg bruises get bigger.  You have a fever.  Your leg feels numb.  You have more redness, swelling, or pain around your incision.  You have more fluid or blood coming from your incision.  Your incision feels warm to the touch.  You have pus or a bad smell coming from your incision. Get help right away if:  Your pain is severe.  You develop pain, tenderness, warmth, redness, or swelling in any part of your leg.  You have chest pain.  You have trouble breathing. This information is not intended to replace advice given to you by your health care provider. Make sure you discuss any questions you have with your health care provider. Document Released: 01/17/2011 Document Revised: 10/13/2015 Document Reviewed: 03/21/2015 Elsevier Interactive Patient Education  2018 [employer].

## 2017-06-28 NOTE — Progress Notes (Signed)
CARDIAC REHAB PHASE I   PRE:  Rate/Rhythm: 64 SR    BP:  94/63 lying, 103/69 sitting    SaO2: 97 RA  MODE:  Ambulation: 320 ft   POST:  Rate/Rhythm: 68 SR    BP: sitting 103/61     SaO2: 99 RA  Pt needed to walk this am without RW. Steady but fatigues easily, rested every 50 ft for SOB. Denied dizziness, etc. To recliner. Does not want RW for home. Encouraged slowly progressing. Ed completed. Will refer to [place] and [place].  [place], ACSM 06/28/2017 11:44 AM

## 2017-07-01 ENCOUNTER — Telehealth (HOSPITAL_COMMUNITY): Payer: Self-pay

## 2017-07-01 NOTE — Telephone Encounter (Signed)
Patients insurance is active and benefits verified through Medicare Part A & B - No co-pay, deductible amount of $185.00/$185.00 has been met, no out of pocket, 20% co-insurance, and no pre-authorization is required. Passport/reference [id number]  Patients insurance is active and benefits verified through [employer] [place] - No co-pay, no deductible, no out of pocket, no co-insurance, and no pre-authorization is required. Passport/reference [id number]  Patient will be contacted and scheduled after their follow up appt with the Cardiologist office upon review by the RN Navigator.

## 2017-07-03 ENCOUNTER — Encounter: Payer: Self-pay | Admitting: Internal Medicine

## 2017-07-09 NOTE — Progress Notes (Signed)
Cardiology Office Note    Date:  07/12/2017   ID:  Hector Clark, DOB [birth year], MRN [id number]  PCP:  Lavone Orn, MD  Primary Cardiologist: Nelva Bush, MD  Electrophysiologist: Dr Rayann Heman  Chief Complaint: Hospital follow up  History of Present Illness:   Hector Clark is a 72 y.o. male with a history of paroxysmal atrial fibrillation on Tikosyn (failed Flecainide) and Xarelto, hyperlipidemia and recent CABG + Maze presents for follow-up.  He was admitted to the hospital 1/29-2/1 for Afib and started on Tikosyn. Planned initially for a cardioversion but then converted to AR just prior to conversion. Discharged home on Xarelto and Tikosyn 500mg  BID.   Presented 06/24/17 with chest pain for the past 2 days.Initial EKG showed Afib with short runs of VT. He then converted to SR and EKG showed depression in lead I, II, aVL and elevation in lead III and aVR. Code STEMI was called.  Emergent cath found to have critical left main coronary artery disease. Intra-aortic balloon pump was placed and emergent cardiothoracic surgical consultation requested.  He was taken to the operating room and underwent Emergent CABG x 2 utilizing LIMA to LAD, SVG to OM, and Complete MAZE procedure.    He maintained sinus rhythm throughout admission.  Resume Xarelto.  Presented for follow-up.  No complaints.  Currently he is walking 16 minutes 3 times a day without any issue.  He is increasing 1 minute every day.  His palpitation has improved, now completely resolved.  Denies chest pain, shortness of breath, orthopnea, PND, syncope, lower extremity edema or melena.   Past Medical History:  Diagnosis Date  . Chest pain    with nagative cardiolite  . Gout    occational flare  . Hyperlipemia   . Persistent atrial fibrillation ([place])   . Peyronie disease   . 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 S/P Maze operation for atrial fibrillation 06/24/2017   Left  atrial lesion set using bipolar radiofrequency and cryothermy ablation via conventional median sternotomy with clipping of LA appendage  . Visit for monitoring Tikosyn therapy 06/18/2017    Past Surgical History:  Procedure Laterality Date  . bone graph    . CARDIOVERSION N/A 03/25/2017   Procedure: CARDIOVERSION;  Surgeon: Sueanne Margarita, MD;  Location: [facility] ENDOSCOPY;  Service: Cardiovascular;  Laterality: N/A;  . CLIPPING OF ATRIAL APPENDAGE N/A 06/24/2017   Procedure: CLIPPING OF ATRIAL APPENDAGE;  Surgeon: Rexene Alberts, MD;  Location: [place];  Service: Open Heart Surgery;  Laterality: N/A;  . CORONARY ARTERY BYPASS GRAFT N/A 06/24/2017   Procedure: CORONARY ARTERY BYPASS GRAFTING (CABG) times two utilizing left anterior mammary artery and right greater saphenous vein harvested endoscopically;  Surgeon: Rexene Alberts, MD;  Location: [place];  Service: Open Heart Surgery;  Laterality: N/A;  . IABP INSERTION N/A 06/24/2017   Procedure: IABP Insertion;  Surgeon: Martinique, Peter M, MD;  Location: [place] CV LAB;  Service: Cardiovascular;  Laterality: N/A;  . LEFT HEART CATH AND CORONARY ANGIOGRAPHY N/A 06/24/2017   Procedure: LEFT HEART CATH AND CORONARY ANGIOGRAPHY;  Surgeon: Martinique, Peter M, MD;  Location: [place] CV LAB;  Service: Cardiovascular;  Laterality: N/A;  . MAZE N/A 06/24/2017   Procedure: MAZE;  Surgeon: Rexene Alberts, MD;  Location: [place];  Service: Open Heart Surgery;  Laterality: N/A;  . TEE WITHOUT CARDIOVERSION N/A 06/24/2017   Procedure: TRANSESOPHAGEAL ECHOCARDIOGRAM (  TEE);  Surgeon: Rexene Alberts, MD;  Location: [place];  Service: Open Heart Surgery;  Laterality: N/A;  . 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 Authorizing Provider  acetaminophen (TYLENOL) 500 MG tablet Take 2 tablets (1,000 mg total) by mouth every 6 (six) hours as needed. 06/28/17   Barrett, Lodema Hong,  PA-C  aspirin EC 81 MG EC tablet Take 1 tablet (81 mg total) by mouth daily. 06/28/17   Barrett, Erin R, PA-C  atorvastatin (LIPITOR) 80 MG tablet Take 1 tablet (80 mg total) by mouth daily at 6 PM. 06/28/17   Barrett, Erin R, PA-C  dofetilide (TIKOSYN) 500 MCG capsule Take 1 capsule (500 mcg total) by mouth 2 (two) times daily. 06/21/17   Barrett, Evelene Croon, PA-C  furosemide (LASIX) 40 MG tablet Take 1 tablet (40 mg total) by mouth daily. 06/28/17   Barrett, Erin R, PA-C  metoprolol succinate (TOPROL XL) 25 MG 24 hr tablet Take 1 tablet (25 mg total) by mouth daily. Ok to take an extra 1/2 tablet prn for afib Patient taking differently: Take 12.5-25 mg by mouth See admin instructions. Take 25mg  daily. May take an additional 12.5mg  as needed for afib. 06/21/17   Barrett, Evelene Croon, PA-C  potassium chloride SA (K-DUR,KLOR-CON) 20 MEQ tablet Take 1 tablet (20 mEq total) by mouth daily. 06/28/17   Barrett, Lodema Hong, PA-C  rivaroxaban (XARELTO) 20 MG TABS tablet Take 1 tablet (20 mg total) by mouth daily with supper. Patient taking differently: Take 20 mg by mouth daily.  02/08/17   [name] T, PA-C  traMADol (ULTRAM) 50 MG tablet Take 1 tablet (50 mg total) by mouth every 4 (four) hours as needed for moderate pain. 06/28/17   Barrett, Lodema Hong, PA-C    Allergies:   Patient has no known allergies.   Social History   Socioeconomic History  . Marital status: Married    Spouse name: None  . Number of children: None  . Years of education: None  . Highest education level: None  Social Needs  . Financial resource strain: None  . Food insecurity - worry: None  . Food insecurity - inability: None  . Transportation needs - medical: None  . Transportation needs - non-medical: None  Occupational History  . None  Tobacco Use  . Smoking status: Never Smoker  . Smokeless tobacco: Never Used  Substance and Sexual Activity  . Alcohol use: Yes    Comment: 1 beer per month  . Drug use: No  . Sexual activity: None    Other Topics Concern  . 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   ROS:   Please see the history of present illness.    ROS All other systems reviewed and are negative.   PHYSICAL EXAM:   VS:  BP 132/70   Pulse 75   Ht 6' 1.5" (1.867 m)   Wt 218 lb 12.8 oz (99.2 kg)   SpO2 95%   BMI 28.48 kg/m    GEN: Well nourished, well developed, in no acute distress  HEENT: normal  Neck:  no JVD, carotid bruits, or masses Cardiac: RRR; no murmurs, rubs, or gallops,no edema. midsternal surgical scar healing well.  Respiratory:  clear to auscultation bilaterally, normal work of breathing GI: soft, nontender, nondistended, + BS MS: no deformity or atrophy  Skin: warm and dry, no rash Neuro:  Alert and Oriented x 3, Strength and sensation are intact Psych: euthymic mood, full affect  Wt Readings from Last 3 Encounters:  07/12/17 218 lb 12.8 oz (99.2 kg)  06/28/17 234 lb 6.4 oz (106.3 kg)  06/21/17 227 lb 1.2 oz (103 kg)      Studies/Labs Reviewed:   EKG:  EKG is ordered today.  The ekg ordered today demonstrates normal sinus rhythm  Recent Labs: 12/14/2016: TSH 2.850 06/24/2017: ALT 25 06/25/2017: Magnesium 2.3 06/28/2017: BUN 23; Creatinine, Ser 0.88; Hemoglobin 11.2; Platelets 142; Potassium 4.5; Sodium 135   Lipid Panel No results found for: CHOL, TRIG, HDL, CHOLHDL, VLDL, LDLCALC, LDLDIRECT  Additional studies/ records that were reviewed today include:   12/20/16 Study Conclusions  - Left ventricle: The cavity size was normal. Wall thickness was   normal. Systolic function was normal. The estimated ejection   fraction was in the range of 50% to 55%. The study is not   technically sufficient to allow evaluation of LV diastolic   function. -  Mitral valve: There was mild regurgitation. - Atrial septum: No defect or patent foramen ovale was identified.  Stress test 12/2016  There was no ST segment deviation noted during stress.  Defect 1: There is a small defect of severe severity present in the apex location.  This is a low risk study.   Low risk stress nuclear study with apical thinning vs small prior infarct; no significant ischemia; study not gated due to atrial fibrillation.  IABP Insertion    06/24/17  LEFT HEART CATH AND CORONARY ANGIOGRAPHY  Conclusion     Prox RCA lesion is 25% stenosed.  Mid RCA lesion is 30% stenosed.  Mid LM lesion is 99% stenosed.  Ost Cx to Prox Cx lesion is 99% stenosed.  LV end diastolic pressure is moderately elevated.  There is moderate left ventricular systolic dysfunction.  The left ventricular ejection fraction is 35-45% by visual estimate.   1. Critical left main and LCx occlusive CAD  2. Moderate LV dysfunction 3. Moderately elevated LVEDP  Plan: IABP support. Recommend emergent CABG. Reviewed with Dr. Roxy Manns and patient will be taken emergently to OR.      TEE 06/24/17  Aortic valve: No stenosis. Trace regurgitation.  Mitral valve: Trace regurgitation.  Right ventricle: Normal cavity size, wall thickness and ejection fraction.  Tricuspid valve: Trace regurgitation. The tricuspid valve regurgitation jet is central.  Pulmonic valve: Trace regurgitation.   ASSESSMENT & PLAN:    1. CAD s/p emergent CABG x 2 utilizing LIMA to LAD, SVG to OM, and Complete MAZE 06/24/14 - He had low risk stress test on 12/2016 (false  Negative).  no angina.  Continue walking regimen.  Continue aspirin 81 mg, Lipitor 80 mg and Toprol-XL 25 mg daily.  2.  Paroxysmal atrial fibrillation -Failed flecainide.  Status post maze procedure during CABG. palpitation almost gone. -Maintaining sinus rhythm.  Continue Tikosyn and Xarelto.  3.  Hyperlipidemia - Continue statin. LDL 106 on  05/10/2017. -Lipid panel and LFT in 4 weeks.   He works as a Lobbyist.  He has big project with state.  He is walking without any angina or dyspnea.  No recurrent palpitation.  Advised  not to lift anything heavy greater than 5lb.  On my standpoint view he can go back to work.  He does computer work mostly.  Encouraged to talk with surgeon before returning to work.  Medication Adjustments/Labs and Tests Ordered: Current medicines are reviewed at length with the patient today.  Concerns regarding medicines are outlined above.  Medication changes, Labs and Tests ordered today are listed in the Patient Instructions below. Patient Instructions  Medication Instructions:  Your physician recommends that you continue on your current medications as directed. Please refer to the Current Medication list given to you today.   Labwork: 4 WEEKS:  FASTING LIPIDS & LFT  (NOTHING TO EAT OR DRINK AFTER MIDNIGHT THE NIGHT BEFORE)  Testing/Procedures: None ordered  Follow-Up: Your physician recommends that you schedule a follow-up appointment in: 3 MONTHS WITH DR. END   Any Other Special Instructions Will Be Listed Below (If Applicable).     If you need a refill on your cardiac medications before your next appointment, please call your pharmacy.      Mahalia Longest [place], [place]  07/12/2017 8:23 AM    [facility] [place], [place], [place]  [address] Phone: [phone]; Fax: [phone]

## 2017-07-12 ENCOUNTER — Ambulatory Visit (INDEPENDENT_AMBULATORY_CARE_PROVIDER_SITE_OTHER): Payer: Medicare Other | Admitting: Physician Assistant

## 2017-07-12 ENCOUNTER — Encounter: Payer: Self-pay | Admitting: Physician Assistant

## 2017-07-12 VITALS — BP 132/70 | HR 75 | Ht 73.5 in | Wt 218.8 lb

## 2017-07-12 DIAGNOSIS — E785 Hyperlipidemia, unspecified: Secondary | ICD-10-CM

## 2017-07-12 DIAGNOSIS — Z951 Presence of aortocoronary bypass graft: Secondary | ICD-10-CM

## 2017-07-12 DIAGNOSIS — I481 Persistent atrial fibrillation: Secondary | ICD-10-CM | POA: Diagnosis not present

## 2017-07-12 DIAGNOSIS — I4819 Other persistent atrial fibrillation: Secondary | ICD-10-CM

## 2017-07-12 NOTE — Patient Instructions (Addendum)
Medication Instructions:  Your physician recommends that you continue on your current medications as directed. Please refer to the Current Medication list given to you today.   Labwork: 4 WEEKS:  FASTING LIPIDS & LFT  (NOTHING TO EAT OR DRINK AFTER MIDNIGHT THE NIGHT BEFORE)  Testing/Procedures: None ordered  Follow-Up: Your physician recommends that you schedule a follow-up appointment in: 3 MONTHS WITH DR. END   Any Other Special Instructions Will Be Listed Below (If Applicable).     If you need a refill on your cardiac medications before your next appointment, please call your pharmacy.

## 2017-07-18 ENCOUNTER — Telehealth (HOSPITAL_COMMUNITY): Payer: Self-pay

## 2017-07-18 NOTE — Telephone Encounter (Signed)
Patient called to follow up on Cardiac Rehab - Patient is interested in the program. Scheduled orientation on 08/27/2017 at 7:30am. Patient will attend the 6:45am exc class. Went over insurance with patient and patient stated she understands.

## 2017-07-21 ENCOUNTER — Encounter: Payer: Self-pay | Admitting: Physician Assistant

## 2017-07-22 ENCOUNTER — Telehealth: Payer: Self-pay

## 2017-07-22 NOTE — Telephone Encounter (Signed)
Patient called said that he had taken the last of the steri-strips off that he had from his CABG x2 on 06/24/2017.  Stated that there was a small opening that was not healed and stated that he had a scant amount of pink/ clear drainage and that was it.  Patient stated that he covered it with a band aid.  I advised him to keep the incision site clean and could place a band aid over the site as needed.  Also advised that if he were to see increased drainage or foul odor coming from the incision site or fever to give the office a call and we would have him come in.  He acknowledged receipt.  Patient has a follow-up appointment with Dr. Roxy Manns on 07/29/2017.

## 2017-07-29 ENCOUNTER — Other Ambulatory Visit: Payer: Self-pay | Admitting: Thoracic Surgery (Cardiothoracic Vascular Surgery)

## 2017-07-29 ENCOUNTER — Encounter: Payer: Self-pay | Admitting: Physician Assistant

## 2017-07-29 ENCOUNTER — Ambulatory Visit (INDEPENDENT_AMBULATORY_CARE_PROVIDER_SITE_OTHER): Payer: Self-pay | Admitting: Physician Assistant

## 2017-07-29 ENCOUNTER — Ambulatory Visit
Admission: RE | Admit: 2017-07-29 | Discharge: 2017-07-29 | Disposition: A | Discharge status: Home / Self Care | Payer: Medicare Other | Source: Ambulatory Visit | Attending: Thoracic Surgery (Cardiothoracic Vascular Surgery) | Admitting: Thoracic Surgery (Cardiothoracic Vascular Surgery)

## 2017-07-29 VITALS — BP 103/67 | HR 74 | Resp 20 | Ht 73.0 in | Wt 217.0 lb

## 2017-07-29 DIAGNOSIS — I2510 Atherosclerotic heart disease of native coronary artery without angina pectoris: Secondary | ICD-10-CM

## 2017-07-29 DIAGNOSIS — Z951 Presence of aortocoronary bypass graft: Secondary | ICD-10-CM

## 2017-07-29 DIAGNOSIS — J90 Pleural effusion, not elsewhere classified: Secondary | ICD-10-CM | POA: Diagnosis not present

## 2017-07-29 NOTE — Progress Notes (Signed)
HPI:  Patient returns for routine postoperative follow-up having undergone an emergent CABG x 2, MAZE, left atrial clip by Dr. Roxy Manns on 06/24/2017. Since hospital discharge the patient reports he is regaining his strength and endurance weekly. He is walking 2-3 times per day. He is sensitive across his chest since surgery, but this too continues to improve. He denies chest pain, tachy palpitations, shortness of breath, or lower extremity edema.   Current Outpatient Medications  Medication Sig Dispense Refill  . acetaminophen (TYLENOL) 500 MG tablet Take 2 tablets (1,000 mg total) by mouth every 6 (six) hours as needed. 30 tablet 0  . aspirin EC 81 MG EC tablet Take 1 tablet (81 mg total) by mouth daily.    Marland Kitchen atorvastatin (LIPITOR) 80 MG tablet Take 1 tablet (80 mg total) by mouth daily at 6 PM. 30 tablet 3  . dofetilide (TIKOSYN) 500 MCG capsule Take 1 capsule (500 mcg total) by mouth 2 (two) times daily. 60 capsule 11  . furosemide (LASIX) 40 MG tablet Take 1 tablet (40 mg total) by mouth daily. (Patient not taking: Reported on 07/29/2017) 30 tablet 0  . metoprolol succinate (TOPROL XL) 25 MG 24 hr tablet Take 1 tablet (25 mg total) by mouth daily. Ok to take an extra 1/2 tablet prn for afib 45 tablet 11  . potassium chloride SA (K-DUR,KLOR-CON) 20 MEQ tablet Take 1 tablet (20 mEq total) by mouth daily. (Patient not taking: Reported on 07/29/2017) 30 tablet 1  . rivaroxaban (XARELTO) 20 MG TABS tablet Take 1 tablet (20 mg total) by mouth daily with supper. 30 tablet 6  . traMADol (ULTRAM) 50 MG tablet Take 1 tablet (50 mg total) by mouth every 4 (four) hours as needed for moderate pain. 30 tablet 0  Vital Signs: BP 103/67, RR 20,HR 74, Oxygen saturation 96% on room air   Physical Exam: CV-RRR Pulmonary-Clear to auscultation bilaterally Extremities-No LE edema Wounds-Sternal and right leg wounds are clean and dry. Eschar removed from middle chest tube wound.   Diagnostic Tests: CLINICAL  DATA:  Chest pain.  EXAM: CHEST - 2 VIEW  COMPARISON:  Radiographs of June 27, 2017.  FINDINGS: The heart size and mediastinal contours are within normal limits. Status post coronary artery bypass graft. No pneumothorax is noted. Right lung is clear. Minimal left pleural effusion is noted. No consolidative process is noted. The visualized skeletal structures are unremarkable.  IMPRESSION: Minimal left pleural effusion. No other significant abnormality seen in the chest.   Electronically Signed   By: Marijo Conception, M.D.   On: 07/29/2017 12:29  Impression and Plan: Overall, Mr. Dewoody is recovering well from coronary artery bypass grafting surgery, LA clip, and MAZE. He appears to be maintaining sinus rhythm. He has already seen the PA from cardiology. Patient is inquiring length of both Tikosyn and Xarelto and I told him to discuss this with his cardiologist.  He is not taking any narcotic for pain so he was instructed he may begin driving short distances (I.e 30 minutes or less) and increase the frequency and duration as tolerates. He was instructed to continue with lifting precautions (I.e. No more than 10 pounds) for 3-4 more weeks. He states cardiac rehab has contacted him and he is going to participate starting in April. He inquired about when he can return to work. 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 I told him to wait a few more weeks and then he could  slowly return.  I did tell him he is likely to be deconditioned for at least 2-3 months after the surgery;although, this should improve weekly. He will continue to be followed by Dr. Saunders Revel. He will also return to see Dr. Roxy Manns at the end of April or beginning of May.    Nani Skillern, PA-C [facility] [phone]

## 2017-07-29 NOTE — Patient Instructions (Signed)
You may return to driving an automobile as long as you are no longer requiring oral narcotic pain relievers during the daytime.  It would be wise to start driving only short distances during the daylight and gradually increase from there as you feel comfortable.  You are encouraged to enroll and participate in the outpatient cardiac rehab program beginning as soon as practical.  You may continue to gradually increase your physical activity as tolerated.  Refrain from any heavy lifting or strenuous use of your arms and shoulders until at least 8 weeks from the time of your surgery, and avoid activities that cause increased pain in your chest on the side of your surgical incision.  Otherwise you may continue to increase activities without any particular limitations.  Increase the intensity and duration of physical activity gradually. 

## 2017-08-14 ENCOUNTER — Encounter: Payer: Self-pay | Admitting: Physician Assistant

## 2017-08-15 ENCOUNTER — Other Ambulatory Visit: Payer: Medicare Other

## 2017-08-20 ENCOUNTER — Other Ambulatory Visit: Payer: Medicare Other

## 2017-08-20 DIAGNOSIS — E785 Hyperlipidemia, unspecified: Secondary | ICD-10-CM | POA: Diagnosis not present

## 2017-08-20 LAB — HEPATIC FUNCTION PANEL
ALBUMIN: 4.2 g/dL (ref 3.5–4.8)
ALT: 21 IU/L (ref 0–44)
AST: 20 IU/L (ref 0–40)
Alkaline Phosphatase: 168 IU/L — ABNORMAL HIGH (ref 39–117)
BILIRUBIN TOTAL: 0.8 mg/dL (ref 0.0–1.2)
Bilirubin, Direct: 0.25 mg/dL (ref 0.00–0.40)
Total Protein: 7.1 g/dL (ref 6.0–8.5)

## 2017-08-20 LAB — LIPID PANEL
CHOLESTEROL TOTAL: 110 mg/dL (ref 100–199)
Chol/HDL Ratio: 2.5 ratio (ref 0.0–5.0)
HDL: 44 mg/dL (ref >39)
LDL CALC: 50 mg/dL (ref 0–99)
Triglycerides: 82 mg/dL (ref 0–149)
VLDL Cholesterol Cal: 16 mg/dL (ref 5–40)

## 2017-08-23 ENCOUNTER — Other Ambulatory Visit: Payer: Self-pay | Admitting: *Deleted

## 2017-08-23 ENCOUNTER — Encounter (INDEPENDENT_AMBULATORY_CARE_PROVIDER_SITE_OTHER): Payer: Self-pay

## 2017-08-23 DIAGNOSIS — T81718A Complication of other artery following a procedure, not elsewhere classified, initial encounter: Secondary | ICD-10-CM

## 2017-08-23 DIAGNOSIS — IMO0002 Reserved for concepts with insufficient information to code with codable children: Secondary | ICD-10-CM

## 2017-08-23 DIAGNOSIS — I729 Aneurysm of unspecified site: Secondary | ICD-10-CM

## 2017-08-26 ENCOUNTER — Telehealth (HOSPITAL_COMMUNITY): Payer: Self-pay | Admitting: Pharmacist

## 2017-08-26 ENCOUNTER — Ambulatory Visit (HOSPITAL_COMMUNITY)
Admission: RE | Admit: 2017-08-26 | Discharge: 2017-08-26 | Disposition: A | Discharge status: Home / Self Care | Payer: Medicare Other | Source: Ambulatory Visit | Attending: Internal Medicine | Admitting: Internal Medicine

## 2017-08-26 DIAGNOSIS — I723 Aneurysm of iliac artery: Secondary | ICD-10-CM | POA: Diagnosis not present

## 2017-08-26 DIAGNOSIS — IMO0002 Reserved for concepts with insufficient information to code with codable children: Secondary | ICD-10-CM

## 2017-08-26 NOTE — Telephone Encounter (Signed)
Cardiac Rehab Medication Review by a Pharmacist  Does the patient  feel that his/her medications are working for him/her?  yes  Has the patient been experiencing any side effects to the medications prescribed?  Yes - sickly-sweet smell in the shower, possibly coming from his own mouth (denies "metallic" taste)  Does the patient measure his/her own blood pressure or blood glucose at home?  Yes 124/82, pulse 69 on the phone.   Does the patient have any problems obtaining medications due to transportation or finances?   no  Understanding of regimen: excellent Understanding of indications: good Potential of compliance: good    Pharmacist comments: Mr. Vanetten reports compliance with his medications. He is interested in talking to his doctor to potentially take less medications or lower doses. He reports he has not had any symptoms of Afib since his procedure (this is another reason he hopes he can stop some meds in the near future).  He does report that he received a call that his alkaline phosphatase was elevated. I checked his labs and it is indeed slightly high at 168, but transaminases and bilirubin are normal. We discussed his concerns about statin use and liver damage, and I encouraged him to bring this up with his cardiologist as they may decide to decrease the dose. However for now I am not concerned about this isolated elevation in alk phos, and directed him to continue taking his medications as prescribed.    Charlene Brooke, PharmD PGY1 Pharmacy Resident Email: [email or web address] Pager: [phone] 08/26/2017 12:59 PM

## 2017-08-27 ENCOUNTER — Encounter (HOSPITAL_COMMUNITY)
Admission: RE | Admit: 2017-08-27 | Discharge: 2017-08-27 | Disposition: A | Discharge status: Home / Self Care | Payer: Medicare Other | Source: Ambulatory Visit | Attending: Internal Medicine | Admitting: Internal Medicine

## 2017-08-27 ENCOUNTER — Telehealth (HOSPITAL_COMMUNITY): Payer: Self-pay | Admitting: *Deleted

## 2017-08-27 VITALS — BP 104/64 | HR 68 | Ht 73.0 in | Wt 220.5 lb

## 2017-08-27 DIAGNOSIS — Z951 Presence of aortocoronary bypass graft: Secondary | ICD-10-CM | POA: Insufficient documentation

## 2017-08-27 DIAGNOSIS — I213 ST elevation (STEMI) myocardial infarction of unspecified site: Secondary | ICD-10-CM | POA: Diagnosis not present

## 2017-08-27 DIAGNOSIS — Z48812 Encounter for surgical aftercare following surgery on the circulatory system: Secondary | ICD-10-CM | POA: Diagnosis not present

## 2017-08-27 NOTE — Progress Notes (Signed)
Hector Clark 72 y.o. male DOB 1946/04/20 MRN [id number]       Nutrition  1. ST elevation myocardial infarction (STEMI), unspecified artery ([place])   2. S/P CABG (coronary artery bypass graft)    Past Medical History:  Diagnosis Date  . Chest pain    with nagative cardiolite  . Gout    occational flare  . Hyperlipemia   . Persistent atrial fibrillation ([place])   . Peyronie disease   . 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 S/P Maze operation for atrial fibrillation 06/24/2017   Left atrial lesion set using bipolar radiofrequency and cryothermy ablation via conventional median sternotomy with clipping of LA appendage  . Visit for monitoring Tikosyn therapy 06/18/2017   Meds reviewed.   HT: Ht Readings from Last 1 Encounters:  08/27/17 6\' 1"  (1.854 m)    WT: Wt Readings from Last 3 Encounters:  08/27/17 220 lb 7.4 oz (100 kg)  07/29/17 217 lb (98.4 kg)  07/12/17 218 lb 12.8 oz (99.2 kg)     BMI 29.09   Current tobacco use? No  Labs:  Lipid Panel     Component Value Date/Time   CHOL 110 08/20/2017 1015   TRIG 82 08/20/2017 1015   HDL 44 08/20/2017 1015   CHOLHDL 2.5 08/20/2017 1015   LDLCALC 50 08/20/2017 1015   No results found for: HGBA1C CBG (last 3)  No results for input(s): GLUCAP in the last 72 hours.  Nutrition Diagnosis ? Overweight related to excessive energy intake as evidenced by a BMI of 29.09.  Nutrition Goal(s):  ? Pt to identify food quantities necessary to achieve weight loss of 6-24 lb (2.7-10.9 kg) at graduation from cardiac rehab.    Plan:  Pt to attend nutrition classes ? Nutrition I ? Nutrition II ? Portion Distortion  Will provide client-centered nutrition education as part of interdisciplinary care.   Monitor and evaluate progress toward nutrition goal with team.  Ranell Patrick, Dietetic Intern 08/27/2017 11:38 AM

## 2017-08-27 NOTE — Progress Notes (Signed)
Cardiac Individual Treatment Plan  Patient Details  Name: Hector Clark MRN: [id number] Date of Birth: [birth year] Referring Provider:   Flowsheet Row CARDIAC REHAB PHASE II ORIENTATION from 08/27/2017 in [place]  Referring Provider  End, Harrell Gave, MD.      Initial Encounter Date:  Flowsheet Row CARDIAC REHAB PHASE II ORIENTATION from 08/27/2017 in [place]  Date  08/27/17  Referring Provider  End, Harrell Gave, MD.      Visit Diagnosis: ST elevation myocardial infarction (STEMI), unspecified artery (Protivin)  S/P CABG (coronary artery bypass graft)  Patient's Home Medications on Admission:  Current Outpatient Medications:  .  acetaminophen (TYLENOL) 500 MG tablet, Take 2 tablets (1,000 mg total) by mouth every 6 (six) hours as needed., Disp: 30 tablet, Rfl: 0 .  Ascorbic Acid (VITAMIN C) 100 MG tablet, Take 100 mg by mouth daily., Disp: , Rfl:  .  aspirin EC 81 MG EC tablet, Take 1 tablet (81 mg total) by mouth daily., Disp: , Rfl:  .  atorvastatin (LIPITOR) 80 MG tablet, Take 1 tablet (80 mg total) by mouth daily at 6 PM., Disp: 30 tablet, Rfl: 3 .  cholecalciferol (VITAMIN D) 1000 units tablet, Take 1,000 Units by mouth daily., Disp: , Rfl:  .  dofetilide (TIKOSYN) 500 MCG capsule, Take 1 capsule (500 mcg total) by mouth 2 (two) times daily., Disp: 60 capsule, Rfl: 11 .  metoprolol succinate (TOPROL XL) 25 MG 24 hr tablet, Take 1 tablet (25 mg total) by mouth daily. Ok to take an extra 1/2 tablet prn for afib, Disp: 45 tablet, Rfl: 11 .  rivaroxaban (XARELTO) 20 MG TABS tablet, Take 1 tablet (20 mg total) by mouth daily with supper., Disp: 30 tablet, Rfl: 6 .  thiamine (VITAMIN B-1) 100 MG tablet, Take 100 mg by mouth daily., Disp: , Rfl:  .  traMADol (ULTRAM) 50 MG tablet, Take 1 tablet (50 mg total) by mouth every 4 (four) hours as needed for moderate pain. (Patient not taking: Reported on 08/26/2017), Disp: 30  tablet, Rfl: 0  Past Medical History: Past Medical History:  Diagnosis Date  . Chest pain    with nagative cardiolite  . Gout    occational flare  . Hyperlipemia   . Persistent atrial fibrillation ([place])   . Peyronie disease   . 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 S/P Maze operation for atrial fibrillation 06/24/2017   Left atrial lesion set using bipolar radiofrequency and cryothermy ablation via conventional median sternotomy with clipping of LA appendage  . Visit for monitoring Tikosyn therapy 06/18/2017    Tobacco Use: Social History   Tobacco Use  Smoking Status Never Smoker  Smokeless Tobacco Never Used    Labs: Recent Review Flowsheet Data    Labs for ITP Cardiac and Pulmonary Rehab Latest Ref Rng & Units 06/25/2017 06/25/2017 06/25/2017 06/25/2017 08/20/2017   Cholestrol 100 - 199 mg/dL - - - - 110   LDLCALC 0 - 99 mg/dL - - - - 50   HDL >39 mg/dL - - - - 44   Trlycerides 0 - 149 mg/dL - - - - 82   PHART 7.350 - 7.450 7.290(L) 7.289(L) 7.369 - -   PCO2ART 32.0 - 48.0 mmHg 43.0 45.0 35.9 - -   HCO3 20.0 - 28.0 mmol/L 20.8 21.7 20.8 - -   TCO2 22 - 32 mmol/L 22 23  22 21(L) -   ACIDBASEDEF 0.0 - 2.0 mmol/L 6.0(H) 5.0(H) 4.0(H) - -   O2SAT % 94.0 97.0 96.0 - -      Capillary Blood Glucose: Lab Results  Component Value Date   GLUCAP 144 (H) 06/28/2017   GLUCAP 113 (H) 06/28/2017   GLUCAP 115 (H) 06/27/2017   GLUCAP 144 (H) 06/27/2017   GLUCAP 147 (H) 06/27/2017     Exercise Target Goals: Date: 08/27/17  Exercise Program Goal: Individual exercise prescription set using results from initial 6 min walk test and THRR while considering  patient's activity barriers and safety.   Exercise Prescription Goal: Initial exercise prescription builds to 30-45 minutes a day of aerobic activity, 2-3 days per week.  Home exercise guidelines will be given to patient during program as part of exercise prescription that the participant will  acknowledge.  Activity Barriers & Risk Stratification: Activity Barriers & Cardiac Risk Stratification - 08/27/17 1041    Activity Barriers & Cardiac Risk Stratification          Activity Barriers  None    Cardiac Risk Stratification  High           6 Minute Walk: 6 Minute Walk    6 Minute Walk    Row Name 08/27/17 1034   Phase  Initial   Distance  1516 feet   Walk Time  6 minutes   # of Rest Breaks  0   MPH  2.87   METS  3.03   RPE  11   VO2 Peak  10.6   Symptoms  No   Resting HR  68 bpm   Resting BP  104/64   Resting Oxygen Saturation   97 %   Exercise Oxygen Saturation  during 6 min walk  98 %   Max Ex. HR  83 bpm   Max Ex. BP  120/80   2 Minute Post BP  106/60          Oxygen Initial Assessment:   Oxygen Re-Evaluation:   Oxygen Discharge (Final Oxygen Re-Evaluation):   Initial Exercise Prescription: Initial Exercise Prescription - 08/27/17 1000    Date of Initial Exercise RX and Referring Provider          Date  08/27/17    Referring Provider  End, Harrell Gave, MD.        Treadmill          MPH  2.6    Grade  0    Minutes  10    METs  2.99        Bike          Level  1.1    Minutes  10    METs  3.08        NuStep          Level  4    SPM  85    Minutes  10    METs  3        Prescription Details          Frequency (times per week)  3    Duration  Progress to 30 minutes of continuous aerobic without signs/symptoms of physical distress        Intensity          THRR 40-80% of Max Heartrate  60-120    Ratings of Perceived Exertion  11-13    Perceived Dyspnea  0-4        Progression  Progression  Continue to progress workloads to maintain intensity without signs/symptoms of physical distress.        Resistance Training          Training Prescription  Yes    Weight  4lbs    Reps  10-15           Perform Capillary Blood Glucose checks as needed.  Exercise Prescription Changes:   Exercise  Comments:   Exercise Goals and Review: Exercise Goals    Exercise Goals    Row Name 08/27/17 1037   Increase Physical Activity  Yes   Intervention  Provide advice, education, support and counseling about physical activity/exercise needs.;Develop an individualized exercise prescription for aerobic and resistive training based on initial evaluation findings, risk stratification, comorbidities and participant's personal goals.   Expected Outcomes  Short Term: Attend rehab on a regular basis to increase amount of physical activity.;Long Term: Exercising regularly at least 3-5 days a week.;Long Term: Add in home exercise to make exercise part of routine and to increase amount of physical activity.   Increase Strength and Stamina  Yes   Intervention  Provide advice, education, support and counseling about physical activity/exercise needs.;Develop an individualized exercise prescription for aerobic and resistive training based on initial evaluation findings, risk stratification, comorbidities and participant's personal goals.   Expected Outcomes  Short Term: Increase workloads from initial exercise prescription for resistance, speed, and METs.;Short Term: Perform resistance training exercises routinely during rehab and add in resistance training at home;Long Term: Improve cardiorespiratory fitness, muscular endurance and strength as measured by increased METs and functional capacity (6MWT)   Able to understand and use rate of perceived exertion (RPE) scale  Yes   Intervention  Provide education and explanation on how to use RPE scale   Expected Outcomes  Short Term: Able to use RPE daily in rehab to express subjective intensity level;Long Term:  Able to use RPE to guide intensity level when exercising independently   Knowledge and understanding of Target Heart Rate Range (THRR)  Yes   Intervention  Provide education and explanation of THRR including how the numbers were predicted and where they are located  for reference   Expected Outcomes  Short Term: Able to state/look up THRR;Long Term: Able to use THRR to govern intensity when exercising independently;Short Term: Able to use daily as guideline for intensity in rehab   Able to check pulse independently  Yes   Intervention  Provide education and demonstration on how to check pulse in carotid and radial arteries.;Review the importance of being able to check your own pulse for safety during independent exercise   Expected Outcomes  Long Term: Able to check pulse independently and accurately;Short Term: Able to explain why pulse checking is important during independent exercise   Understanding of Exercise Prescription  Yes   Intervention  Provide education, explanation, and written materials on patient's individual exercise prescription   Expected Outcomes  Short Term: Able to explain program exercise prescription;Long Term: Able to explain home exercise prescription to exercise independently          Exercise Goals Re-Evaluation :    Discharge Exercise Prescription (Final Exercise Prescription Changes):   Nutrition:  Target Goals: Understanding of nutrition guidelines, daily intake of sodium 1500mg , cholesterol 200mg , calories 30% from fat and 7% or less from saturated fats, daily to have 5 or more servings of fruits and vegetables.  Biometrics: Pre Biometrics - 08/27/17 1038    Pre Biometrics  Height  6\' 1"  (1.854 m)    Weight  220 lb 7.4 oz (100 kg)    Waist Circumference  43 inches    Hip Circumference  43.5 inches    Waist to Hip Ratio  0.99 %    BMI (Calculated)  29.09    Triceps Skinfold  30 mm    % Body Fat  31.9 %    Grip Strength  46.5 kg    Flexibility  0 in    Single Leg Stand  5.09 seconds            Nutrition Therapy Plan and Nutrition Goals: Nutrition Therapy & Goals - 08/27/17 1201    Nutrition Therapy          Diet  Heart Healthy        Personal Nutrition Goals          Nutrition Goal  Pt  to identify food quantities necessary to achieve weight loss of 6-24 lb (2.7-10.9 kg) at graduation from cardiac rehab.          Intervention Plan          Intervention  Prescribe, educate and counsel regarding individualized specific dietary modifications aiming towards targeted core components such as weight, hypertension, lipid management, diabetes, heart failure and other comorbidities.    Expected Outcomes  Short Term Goal: Understand basic principles of dietary content, such as calories, fat, sodium, cholesterol and nutrients.;Long Term Goal: Adherence to prescribed nutrition plan.           Nutrition Assessments: Nutrition Assessments - 08/27/17 1201    MEDFICTS Scores          Pre Score  45           Nutrition Goals Re-Evaluation:   Nutrition Goals Re-Evaluation:   Nutrition Goals Discharge (Final Nutrition Goals Re-Evaluation):   Psychosocial: Target Goals: Acknowledge presence or absence of significant depression and/or stress, maximize coping skills, provide positive support system. Participant is able to verbalize types and ability to use techniques and skills needed for reducing stress and depression.  Initial Review & Psychosocial Screening: Initial Psych Review & Screening - 08/27/17 0918    Initial Review          Current issues with  None Identified        Family Dynamics          Good Support System?  Yes God, spouse, family, friends         Barriers          Psychosocial barriers to participate in program  There are no identifiable barriers or psychosocial needs.        Screening Interventions          Interventions  Encouraged to exercise           Quality of Life Scores: Quality of Life - 08/27/17 0928    Quality of Life Scores          Health/Function Pre  27.6 %    Socioeconomic Pre  30 %    Psych/Spiritual Pre  29.14 %    Family Pre  30 %    GLOBAL Pre  28.8 %          Scores of 19 and below usually indicate a poorer  quality of life in these areas.  A difference of  2-3 points is a clinically meaningful difference.  A difference of 2-3 points in the total score of the Quality of Life Index has  been associated with significant improvement in overall quality of life, self-image, physical symptoms, and general health in studies assessing change in quality of life.  PHQ-9: Recent Review Flowsheet Data    Depression screen [facility] 2/9 10/16/2014   Decreased Interest 0   Down, Depressed, Hopeless 0   PHQ - 2 Score 0     Interpretation of Total Score  Total Score Depression Severity:  1-4 = Minimal depression, 5-9 = Mild depression, 10-14 = Moderate depression, 15-19 = Moderately severe depression, 20-27 = Severe depression   Psychosocial Evaluation and Intervention:   Psychosocial Re-Evaluation:   Psychosocial Discharge (Final Psychosocial Re-Evaluation):   Vocational Rehabilitation: Provide vocational rehab assistance to qualifying candidates.   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 Participant will state understanding/return demonstration of topics presented.  Learning Barriers/Preferences: Learning Barriers/Preferences - 08/27/17 1104    Learning Barriers/Preferences          Learning Barriers  Sight    Learning Preferences  None           Education Topics: Count Your Pulse:  -Group instruction provided by verbal instruction, demonstration, patient participation and written materials to support subject.  Instructors address importance of being able to find your pulse and how to count your pulse when at home without a heart monitor.  Patients get hands on experience counting their pulse  with staff help and individually.   Heart Attack, Angina, and Risk Factor Modification:  -Group instruction provided by verbal instruction, video, and written materials to support subject.  Instructors address signs and symptoms of angina and heart attacks.    Also discuss risk factors for heart disease and how to make changes to improve heart health risk factors.   Functional Fitness:  -Group instruction provided by verbal instruction, demonstration, patient participation, and written materials to support subject.  Instructors address safety measures for doing things around the house.  Discuss how to get up and down off the floor, how to pick things up properly, how to safely get out of a chair without assistance, and balance training.   Meditation and Mindfulness:  -Group instruction provided by verbal instruction, patient participation, and written materials to support subject.  Instructor addresses importance of mindfulness and meditation practice to help reduce stress and improve awareness.  Instructor also leads participants through a meditation exercise.    Stretching for Flexibility and Mobility:  -Group instruction provided by verbal instruction, patient participation, and written materials to support subject.  Instructors lead participants through series of stretches that are designed to increase flexibility thus improving mobility.  These stretches are additional exercise for major muscle groups that are typically performed during regular warm up and cool down.   Hands Only CPR:  -Group verbal, video, and participation provides a basic overview of AHA guidelines for community CPR. Role-play of emergencies allow participants the opportunity to practice calling for help and chest compression technique with discussion of AED use.   Hypertension: -Group verbal and written instruction that provides a basic overview of hypertension including the most recent diagnostic guidelines, risk  factor reduction with self-care instructions and medication management.    Nutrition I class: Heart Healthy  Eating:  -Group instruction provided by PowerPoint slides, verbal discussion, and written materials to support subject matter. The instructor gives an explanation and review of the Therapeutic Lifestyle Changes diet recommendations, which includes a discussion on lipid goals, dietary fat, sodium, fiber, plant stanol/sterol esters, sugar, and the components of a well-balanced, healthy diet.   Nutrition II class: Lifestyle Skills:  -Group instruction provided by PowerPoint slides, verbal discussion, and written materials to support subject matter. The instructor gives an explanation and review of label reading, grocery shopping for heart health, heart healthy recipe modifications, and ways to make healthier choices when eating out.   Diabetes Question & Answer:  -Group instruction provided by PowerPoint slides, verbal discussion, and written materials to support subject matter. The instructor gives an explanation and review of diabetes co-morbidities, pre- and post-prandial blood glucose goals, pre-exercise blood glucose goals, signs, symptoms, and treatment of hypoglycemia and hyperglycemia, and foot care basics.   Diabetes Blitz:  -Group instruction provided by PowerPoint slides, verbal discussion, and written materials to support subject matter. The instructor gives an explanation and review of the physiology behind type 1 and type 2 diabetes, diabetes medications and rational behind using different medications, pre- and post-prandial blood glucose recommendations and Hemoglobin A1c goals, diabetes diet, and exercise including blood glucose guidelines for exercising safely.    Portion Distortion:  -Group instruction provided by PowerPoint slides, verbal discussion, written materials, and food models to support subject matter. The instructor gives an explanation of serving size versus  portion size, changes in portions sizes over the last 20 years, and what consists of a serving from each food group.   Stress Management:  -Group instruction provided by verbal instruction, video, and written materials to support subject matter.  Instructors review role of stress in heart disease and how to cope with stress positively.     Exercising on Your Own:  -Group instruction provided by verbal instruction, power point, and written materials to support subject.  Instructors discuss benefits of exercise, components of exercise, frequency and intensity of exercise, and end points for exercise.  Also discuss use of nitroglycerin and activating EMS.  Review options of places to exercise outside of rehab.  Review guidelines for sex with heart disease.   Cardiac Drugs I:  -Group instruction provided by verbal instruction and written materials to support subject.  Instructor reviews cardiac drug classes: antiplatelets, anticoagulants, beta blockers, and statins.  Instructor discusses reasons, side effects, and lifestyle considerations for each drug class.   Cardiac Drugs II:  -Group instruction provided by verbal instruction and written materials to support subject.  Instructor reviews cardiac drug classes: angiotensin converting enzyme inhibitors (ACE-I), angiotensin II receptor blockers (ARBs), nitrates, and calcium channel blockers.  Instructor discusses reasons, side effects, and lifestyle considerations for each drug class.   Anatomy and Physiology of the Circulatory System:  Group verbal and written instruction and models provide basic cardiac anatomy and physiology, with the coronary electrical and arterial systems. Review of: AMI, Angina, Valve disease, Heart Failure, Peripheral Artery Disease, Cardiac Arrhythmia, Pacemakers, and the ICD.   Other Education:  -Group or individual verbal, written, or video instructions that support the educational goals of the cardiac rehab  program.   Holiday Eating Survival Tips:  -Group instruction provided by PowerPoint slides, verbal discussion, and written materials to support subject matter. The instructor gives patients tips, tricks, and techniques to help them not only survive but enjoy the holidays despite the onslaught of food that accompanies the holidays.  Knowledge Questionnaire Score: Knowledge Questionnaire Score - 08/27/17 0917    Knowledge Questionnaire Score          Pre Score  21/24           Core Components/Risk Factors/Patient Goals at Admission: Personal Goals and Risk Factors at Admission - 08/27/17 0929    Core Components/Risk Factors/Patient Goals on Admission           Weight Management  Obesity;Yes    Intervention  Weight Management: Develop a combined nutrition and exercise program designed to reach desired caloric intake, while maintaining appropriate intake of nutrient and fiber, sodium and fats, and appropriate energy expenditure required for the weight goal.;Obesity: Provide education and appropriate resources to help participant work on and attain dietary goals.    Admit Weight  220 lb 7.4 oz (100 kg)    Expected Outcomes  Short Term: Continue to assess and modify interventions until short term weight is achieved;Long Term: Adherence to nutrition and physical activity/exercise program aimed toward attainment of established weight goal;Weight Loss: Understanding of general recommendations for a balanced deficit meal plan, which promotes 1-2 lb weight loss per week and includes a negative energy balance of [phone] kcal/d;Understanding recommendations for meals to include 15-35% energy as protein, 25-35% energy from fat, 35-60% energy from carbohydrates, less than 200mg  of dietary cholesterol, 20-35 gm of total fiber daily;Weight Maintenance: Understanding of the daily nutrition guidelines, which includes 25-35% calories from fat, 7% or less cal from saturated fats, less than 200mg  cholesterol,  less than 1.5gm of sodium, & 5 or more servings of fruits and vegetables daily    Lipids  Yes    Intervention  Provide education and support for participant on nutrition & aerobic/resistive exercise along with prescribed medications to achieve LDL 70mg , HDL >40mg .    Expected Outcomes  Short Term: Participant states understanding of desired cholesterol values and is compliant with medications prescribed. Participant is following exercise prescription and nutrition guidelines.;Long Term: Cholesterol controlled with medications as prescribed, with individualized exercise RX and with personalized nutrition plan. Value goals: LDL < 70mg , HDL > 40 mg.    Personal Goal Other  Yes    Personal Goal  Learn physical activity parameters, resume exercise regimen safely.    Intervention  participation in CR exercise and education opportunities.    Expected Outcomes  pt will demonstrate safe exercise and physical activity participation.            Core Components/Risk Factors/Patient Goals Review:    Core Components/Risk Factors/Patient Goals at Discharge (Final Review):    ITP Comments: ITP Comments    Row Name 08/27/17 0917   ITP Comments  Dr. Fransico Him, Medical Director       Comments: Patient attended orientation from [phone] to 0835am  to review rules and guidelines for program. Completed 6 minute walk test, Intitial ITP, and exercise prescription.  VSS. Telemetry-sinus rhythm.  Asymptomatic. Andi Hence, RN, BSN Cardiac Pulmonary Rehab 08/27/17 12:38 PM

## 2017-08-27 NOTE — Telephone Encounter (Signed)
-----   Message from Nelva Bush, MD sent at 08/26/2017  5:26 PM EDT ----- Regarding: RE: Ok to proceed with cardiac rehab? I do not see the results of today's groin ultrasound, either. Assuming that there is no evidence of a pseudoaneurysm, I am fine with him starting cardiac rehab.  Chris End  ----- Message ----- From: Rowe Pavy, RN Sent: 08/26/2017   2:17 PM To: Nelva Bush, MD Subject: Ok to proceed with cardiac rehab?              Dr. Saunders Revel,  The above patient is scheduled to begin cardiac rehab s/p 06/24/17 STEMI and emerg. CABG x 2 with maze and LAA. Pt had ultrasound to his groin to rule out pseudoaneurysm this morning.  Pt complained of swelling and discomfort around his right testicle area. Based upon the results (I do not see them in [place])  May pt proceed with cardiac rehab?  If so any restrictions to his activity?  Thanks for your input Maurice Small RN, BSN Cardiac and Pulmonary Rehab Nurse Navigator

## 2017-09-02 ENCOUNTER — Encounter (HOSPITAL_COMMUNITY)
Admission: RE | Admit: 2017-09-02 | Discharge: 2017-09-02 | Disposition: A | Discharge status: Home / Self Care | Payer: Medicare Other | Source: Ambulatory Visit | Attending: Internal Medicine | Admitting: Internal Medicine

## 2017-09-02 ENCOUNTER — Encounter (HOSPITAL_COMMUNITY): Payer: Self-pay

## 2017-09-02 DIAGNOSIS — Z951 Presence of aortocoronary bypass graft: Secondary | ICD-10-CM

## 2017-09-02 DIAGNOSIS — I213 ST elevation (STEMI) myocardial infarction of unspecified site: Secondary | ICD-10-CM

## 2017-09-02 DIAGNOSIS — Z48812 Encounter for surgical aftercare following surgery on the circulatory system: Secondary | ICD-10-CM | POA: Diagnosis not present

## 2017-09-02 NOTE — Progress Notes (Addendum)
Daily Session Note  Patient Details  Name: Hector Clark MRN: [id number] Date of Birth: [birth year] Referring Provider:     CARDIAC REHAB PHASE II ORIENTATION from 08/27/2017 in [place]  Referring Provider  End, Harrell Gave, MD.      Encounter Date: 09/02/2017  Check In: Session Check In - 09/02/17 0730      Check-In   Location  [facility]    Staff Present  Dorma Russell, MS,ACSM CEP, Exercise Physiologist;Carlette Wilber Oliphant, RN, BSN;Ramon Dredge, RN, [facility]    Supervising physician immediately available to respond to emergencies  [facility] immediately available    Physician(s)  Dr. Alfredia Ferguson    Medication changes reported      No    Fall or balance concerns reported     No    Tobacco Cessation  No Change    Warm-up and Cool-down  Performed as group-led instruction    Resistance Training Performed  Yes    VAD Patient?  No      Pain Assessment   Currently in Pain?  No/denies    Multiple Pain Sites  No       Capillary Blood Glucose: No results found for this or any previous visit (from the past 24 hour(s)).  Exercise Prescription Changes - 09/02/17 1000      Response to Exercise   Blood Pressure (Admit)  108/70    Blood Pressure (Exercise)  132/74    Blood Pressure (Exit)  118/68    Heart Rate (Admit)  78 bpm    Heart Rate (Exercise)  97 bpm    Heart Rate (Exit)  75 bpm    Rating of Perceived Exertion (Exercise)  12    Perceived Dyspnea (Exercise)  0    Symptoms  None    Comments  Pt oriented to exercise equipment    Duration  Progress to 30 minutes of  aerobic without signs/symptoms of physical distress    Intensity  THRR unchanged      Progression   Progression  Continue to progress workloads to maintain intensity without signs/symptoms of physical distress.    Average METs  2.79      Resistance Training   Training Prescription  Yes    Weight  4lbs    Reps  10-15    Time  10 Minutes      Interval  Training   Interval Training  No      Treadmill   MPH  2.6    Grade  0    Minutes  10    METs  2.99      Bike   Level  1.1    Minutes  10    METs  3.08      NuStep   Level  4    SPM  85    Minutes  10    METs  2.3       Social History   Tobacco Use  Smoking Status Never Smoker  Smokeless Tobacco Never Used    Goals Met:  Exercise tolerated well  Goals Unmet:  Not Applicable  Comments: Pt started cardiac rehab today. Ultrasound results reviewed prior to exercise start.  Pt tolerated light exercise without difficulty. VSS, telemetry-SR, asymptomatic.  Medication list reconciled. Pt denies barriers to medicaiton compliance.  PSYCHOSOCIAL ASSESSMENT:  PHQ-0. Pt exhibits positive coping skills, hopeful outlook with supportive family. No psychosocial needs identified at this time, no psychosocial interventions necessary.  Pt oriented to exercise  equipment and routine.  Understanding verbalized.   Dr. Fransico Him is Medical Director for Cardiac Rehab at [facility].

## 2017-09-04 ENCOUNTER — Encounter (HOSPITAL_COMMUNITY): Payer: Self-pay

## 2017-09-04 ENCOUNTER — Encounter (HOSPITAL_COMMUNITY)
Admission: RE | Admit: 2017-09-04 | Discharge: 2017-09-04 | Disposition: A | Discharge status: Home / Self Care | Payer: Medicare Other | Source: Ambulatory Visit | Attending: Internal Medicine | Admitting: Internal Medicine

## 2017-09-04 DIAGNOSIS — I213 ST elevation (STEMI) myocardial infarction of unspecified site: Secondary | ICD-10-CM | POA: Diagnosis not present

## 2017-09-04 DIAGNOSIS — Z951 Presence of aortocoronary bypass graft: Secondary | ICD-10-CM

## 2017-09-04 DIAGNOSIS — K4090 Unilateral inguinal hernia, without obstruction or gangrene, not specified as recurrent: Secondary | ICD-10-CM | POA: Diagnosis not present

## 2017-09-04 DIAGNOSIS — Z48812 Encounter for surgical aftercare following surgery on the circulatory system: Secondary | ICD-10-CM | POA: Diagnosis not present

## 2017-09-05 NOTE — Progress Notes (Signed)
Cardiac Individual Treatment Plan  Patient Details  Name: Hector Clark MRN: [id number] Date of Birth: 07/02/1945 Referring Provider:     CARDIAC REHAB PHASE II ORIENTATION from 08/27/2017 in [place]  Referring Provider  End, Harrell Gave, MD.      Initial Encounter Date:    CARDIAC REHAB PHASE II ORIENTATION from 08/27/2017 in [place]  Date  08/27/17  Referring Provider  End, Harrell Gave, MD.      Visit Diagnosis: ST elevation myocardial infarction (STEMI), unspecified artery ([place])  S/P CABG (coronary artery bypass graft)  Patient's Home Medications on Admission:  Current Outpatient Medications:  .  acetaminophen (TYLENOL) 500 MG tablet, Take 2 tablets (1,000 mg total) by mouth every 6 (six) hours as needed., Disp: 30 tablet, Rfl: 0 .  Ascorbic Acid (VITAMIN C) 100 MG tablet, Take 100 mg by mouth daily., Disp: , Rfl:  .  aspirin EC 81 MG EC tablet, Take 1 tablet (81 mg total) by mouth daily., Disp: , Rfl:  .  atorvastatin (LIPITOR) 80 MG tablet, Take 1 tablet (80 mg total) by mouth daily at 6 PM., Disp: 30 tablet, Rfl: 3 .  cholecalciferol (VITAMIN D) 1000 units tablet, Take 1,000 Units by mouth daily., Disp: , Rfl:  .  dofetilide (TIKOSYN) 500 MCG capsule, Take 1 capsule (500 mcg total) by mouth 2 (two) times daily., Disp: 60 capsule, Rfl: 11 .  metoprolol succinate (TOPROL XL) 25 MG 24 hr tablet, Take 1 tablet (25 mg total) by mouth daily. Ok to take an extra 1/2 tablet prn for afib, Disp: 45 tablet, Rfl: 11 .  rivaroxaban (XARELTO) 20 MG TABS tablet, Take 1 tablet (20 mg total) by mouth daily with supper., Disp: 30 tablet, Rfl: 6 .  thiamine (VITAMIN B-1) 100 MG tablet, Take 100 mg by mouth daily., Disp: , Rfl:  .  traMADol (ULTRAM) 50 MG tablet, Take 1 tablet (50 mg total) by mouth every 4 (four) hours as needed for moderate pain. (Patient not taking: Reported on 08/26/2017), Disp: 30 tablet, Rfl: 0  Past  Medical History: Past Medical History:  Diagnosis Date  . Chest pain    with nagative cardiolite  . Gout    occational flare  . Hyperlipemia   . Persistent atrial fibrillation ([place])   . Peyronie disease   . 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 S/P Maze operation for atrial fibrillation 06/24/2017   Left atrial lesion set using bipolar radiofrequency and cryothermy ablation via conventional median sternotomy with clipping of LA appendage  . Visit for monitoring Tikosyn therapy 06/18/2017    Tobacco Use: Social History   Tobacco Use  Smoking Status Never Smoker  Smokeless Tobacco Never Used    Labs: Recent Review Flowsheet Data    Labs for ITP Cardiac and Pulmonary Rehab Latest Ref Rng & Units 06/25/2017 06/25/2017 06/25/2017 06/25/2017 08/20/2017   Cholestrol 100 - 199 mg/dL - - - - 110   LDLCALC 0 - 99 mg/dL - - - - 50   HDL >39 mg/dL - - - - 44   Trlycerides 0 - 149 mg/dL - - - - 82   PHART 7.350 - 7.450 7.290(L) 7.289(L) 7.369 - -   PCO2ART 32.0 - 48.0 mmHg 43.0 45.0 35.9 - -   HCO3 20.0 - 28.0 mmol/L 20.8 21.7 20.8 - -   TCO2 22 - 32 mmol/L 22 23  22 21(L) -   ACIDBASEDEF 0.0 - 2.0 mmol/L 6.0(H) 5.0(H) 4.0(H) - -   O2SAT % 94.0 97.0 96.0 - -      Capillary Blood Glucose: Lab Results  Component Value Date   GLUCAP 144 (H) 06/28/2017   GLUCAP 113 (H) 06/28/2017   GLUCAP 115 (H) 06/27/2017   GLUCAP 144 (H) 06/27/2017   GLUCAP 147 (H) 06/27/2017     Exercise Target Goals:    Exercise Program Goal: Individual exercise prescription set using results from initial 6 min walk test and THRR while considering  patient's activity barriers and safety.   Exercise Prescription Goal: Initial exercise prescription builds to 30-45 minutes a day of aerobic activity, 2-3 days per week.  Home exercise guidelines will be given to patient during program as part of exercise prescription that the participant will acknowledge.  Activity Barriers &  Risk Stratification: Activity Barriers & Cardiac Risk Stratification - 08/27/17 1041      Activity Barriers & Cardiac Risk Stratification   Activity Barriers  None    Cardiac Risk Stratification  High       6 Minute Walk: 6 Minute Walk    Row Name 08/27/17 1034         6 Minute Walk   Phase  Initial     Distance  1516 feet     Walk Time  6 minutes     # of Rest Breaks  0     MPH  2.87     METS  3.03     RPE  11     VO2 Peak  10.6     Symptoms  No     Resting HR  68 bpm     Resting BP  104/64     Resting Oxygen Saturation   97 %     Exercise Oxygen Saturation  during 6 min walk  98 %     Max Ex. HR  83 bpm     Max Ex. BP  120/80     2 Minute Post BP  106/60        Oxygen Initial Assessment:   Oxygen Re-Evaluation:   Oxygen Discharge (Final Oxygen Re-Evaluation):   Initial Exercise Prescription: Initial Exercise Prescription - 08/27/17 1000      Date of Initial Exercise RX and Referring Provider   Date  08/27/17    Referring Provider  End, Harrell Gave, MD.      Treadmill   MPH  2.6    Grade  0    Minutes  10    METs  2.99      Bike   Level  1.1    Minutes  10    METs  3.08      NuStep   Level  4    SPM  85    Minutes  10    METs  3      Prescription Details   Frequency (times per week)  3    Duration  Progress to 30 minutes of continuous aerobic without signs/symptoms of physical distress      Intensity   THRR 40-80% of Max Heartrate  60-120    Ratings of Perceived Exertion  11-13    Perceived Dyspnea  0-4      Progression   Progression  Continue to progress workloads to maintain intensity without signs/symptoms of physical distress.      Resistance Training   Training Prescription  Yes    Weight  4lbs  Reps  10-15       Perform Capillary Blood Glucose checks as needed.  Exercise Prescription Changes: Exercise Prescription Changes    Row Name 09/02/17 1000             Response to Exercise   Blood Pressure (Admit)   108/70       Blood Pressure (Exercise)  132/74       Blood Pressure (Exit)  118/68       Heart Rate (Admit)  78 bpm       Heart Rate (Exercise)  97 bpm       Heart Rate (Exit)  75 bpm       Rating of Perceived Exertion (Exercise)  12       Perceived Dyspnea (Exercise)  0       Symptoms  None       Comments  Pt oriented to exercise equipment       Duration  Progress to 30 minutes of  aerobic without signs/symptoms of physical distress       Intensity  THRR unchanged         Progression   Progression  Continue to progress workloads to maintain intensity without signs/symptoms of physical distress.       Average METs  2.79         Resistance Training   Training Prescription  Yes       Weight  4lbs       Reps  10-15       Time  10 Minutes         Interval Training   Interval Training  No         Treadmill   MPH  2.6       Grade  0       Minutes  10       METs  2.99         Bike   Level  1.1       Minutes  10       METs  3.08         NuStep   Level  4       SPM  85       Minutes  10       METs  2.3          Exercise Comments: Exercise Comments    Row Name 09/02/17 1050           Exercise Comments  Pt's first day of rehab went well. Pt responded well to exercise prescription and was oriented to exercise equipment. Will continue to monitor and progress pt as tolerated.           Exercise Goals and Review: Exercise Goals    Row Name 08/27/17 1037             Exercise Goals   Increase Physical Activity  Yes       Intervention  Provide advice, education, support and counseling about physical activity/exercise needs.;Develop an individualized exercise prescription for aerobic and resistive training based on initial evaluation findings, risk stratification, comorbidities and participant's personal goals.       Expected Outcomes  Short Term: Attend rehab on a regular basis to increase amount of physical activity.;Long Term: Exercising regularly at least 3-5 days  a week.;Long Term: Add in home exercise to make exercise part of routine and to increase amount of physical activity.       Increase Strength and  Stamina  Yes       Intervention  Provide advice, education, support and counseling about physical activity/exercise needs.;Develop an individualized exercise prescription for aerobic and resistive training based on initial evaluation findings, risk stratification, comorbidities and participant's personal goals.       Expected Outcomes  Short Term: Increase workloads from initial exercise prescription for resistance, speed, and METs.;Short Term: Perform resistance training exercises routinely during rehab and add in resistance training at home;Long Term: Improve cardiorespiratory fitness, muscular endurance and strength as measured by increased METs and functional capacity (6MWT)       Able to understand and use rate of perceived exertion (RPE) scale  Yes       Intervention  Provide education and explanation on how to use RPE scale       Expected Outcomes  Short Term: Able to use RPE daily in rehab to express subjective intensity level;Long Term:  Able to use RPE to guide intensity level when exercising independently       Knowledge and understanding of Target Heart Rate Range (THRR)  Yes       Intervention  Provide education and explanation of THRR including how the numbers were predicted and where they are located for reference       Expected Outcomes  Short Term: Able to state/look up THRR;Long Term: Able to use THRR to govern intensity when exercising independently;Short Term: Able to use daily as guideline for intensity in rehab       Able to check pulse independently  Yes       Intervention  Provide education and demonstration on how to check pulse in carotid and radial arteries.;Review the importance of being able to check your own pulse for safety during independent exercise       Expected Outcomes  Long Term: Able to check pulse independently and  accurately;Short Term: Able to explain why pulse checking is important during independent exercise       Understanding of Exercise Prescription  Yes       Intervention  Provide education, explanation, and written materials on patient's individual exercise prescription       Expected Outcomes  Short Term: Able to explain program exercise prescription;Long Term: Able to explain home exercise prescription to exercise independently          Exercise Goals Re-Evaluation :    Discharge Exercise Prescription (Final Exercise Prescription Changes): Exercise Prescription Changes - 09/02/17 1000      Response to Exercise   Blood Pressure (Admit)  108/70    Blood Pressure (Exercise)  132/74    Blood Pressure (Exit)  118/68    Heart Rate (Admit)  78 bpm    Heart Rate (Exercise)  97 bpm    Heart Rate (Exit)  75 bpm    Rating of Perceived Exertion (Exercise)  12    Perceived Dyspnea (Exercise)  0    Symptoms  None    Comments  Pt oriented to exercise equipment    Duration  Progress to 30 minutes of  aerobic without signs/symptoms of physical distress    Intensity  THRR unchanged      Progression   Progression  Continue to progress workloads to maintain intensity without signs/symptoms of physical distress.    Average METs  2.79      Resistance Training   Training Prescription  Yes    Weight  4lbs    Reps  10-15    Time  10 Minutes  Interval Training   Interval Training  No      Treadmill   MPH  2.6    Grade  0    Minutes  10    METs  2.99      Bike   Level  1.1    Minutes  10    METs  3.08      NuStep   Level  4    SPM  85    Minutes  10    METs  2.3       Nutrition:  Target Goals: Understanding of nutrition guidelines, daily intake of sodium 1500mg , cholesterol 200mg , calories 30% from fat and 7% or less from saturated fats, daily to have 5 or more servings of fruits and vegetables.  Biometrics: Pre Biometrics - 08/27/17 1038      Pre Biometrics   Height  6'  1" (1.854 m)    Weight  220 lb 7.4 oz (100 kg)    Waist Circumference  43 inches    Hip Circumference  43.5 inches    Waist to Hip Ratio  0.99 %    BMI (Calculated)  29.09    Triceps Skinfold  30 mm    % Body Fat  31.9 %    Grip Strength  46.5 kg    Flexibility  0 in    Single Leg Stand  5.09 seconds        Nutrition Therapy Plan and Nutrition Goals: Nutrition Therapy & Goals - 08/27/17 1201      Nutrition Therapy   Diet  Heart Healthy      Personal Nutrition Goals   Nutrition Goal  Pt to identify food quantities necessary to achieve weight loss of 6-24 lb (2.7-10.9 kg) at graduation from cardiac rehab.        Intervention Plan   Intervention  Prescribe, educate and counsel regarding individualized specific dietary modifications aiming towards targeted core components such as weight, hypertension, lipid management, diabetes, heart failure and other comorbidities.    Expected Outcomes  Short Term Goal: Understand basic principles of dietary content, such as calories, fat, sodium, cholesterol and nutrients.;Long Term Goal: Adherence to prescribed nutrition plan.       Nutrition Assessments: Nutrition Assessments - 08/27/17 1201      MEDFICTS Scores   Pre Score  45       Nutrition Goals Re-Evaluation:   Nutrition Goals Re-Evaluation:   Nutrition Goals Discharge (Final Nutrition Goals Re-Evaluation):   Psychosocial: Target Goals: Acknowledge presence or absence of significant depression and/or stress, maximize coping skills, provide positive support system. Participant is able to verbalize types and ability to use techniques and skills needed for reducing stress and depression.  Initial Review & Psychosocial Screening: Initial Psych Review & Screening - 08/27/17 0918      Initial Review   Current issues with  None Identified      Family Dynamics   Good Support System?  Yes God, spouse, family, friends       Barriers   Psychosocial barriers to participate in  program  There are no identifiable barriers or psychosocial needs.      Screening Interventions   Interventions  Encouraged to exercise       Quality of Life Scores: Quality of Life - 08/27/17 0928      Quality of Life Scores   Health/Function Pre  27.6 %    Socioeconomic Pre  30 %    Psych/Spiritual Pre  29.14 %  Family Pre  30 %    GLOBAL Pre  28.8 %      Scores of 19 and below usually indicate a poorer quality of life in these areas.  A difference of  2-3 points is a clinically meaningful difference.  A difference of 2-3 points in the total score of the Quality of Life Index has been associated with significant improvement in overall quality of life, self-image, physical symptoms, and general health in studies assessing change in quality of life.  PHQ-9: Recent Review Flowsheet Data    Depression screen [facility] 2/9 09/02/2017 10/16/2014   Decreased Interest 0 0   Down, Depressed, Hopeless 0 0   PHQ - 2 Score 0 0     Interpretation of Total Score  Total Score Depression Severity:  1-4 = Minimal depression, 5-9 = Mild depression, 10-14 = Moderate depression, 15-19 = Moderately severe depression, 20-27 = Severe depression   Psychosocial Evaluation and Intervention: Psychosocial Evaluation - 09/02/17 1104      Psychosocial Evaluation & Interventions   Interventions  Encouraged to exercise with the program and follow exercise prescription    Comments  No psychosoical needs identified. No intervnetions necessary.  Pt enjoys participating in Bible study and other activities with his church.     Expected Outcomes  Pt will exhibit a positive outlook with good coping skills.     Continue Psychosocial Services   No Follow up required       Psychosocial Re-Evaluation: Psychosocial Re-Evaluation    [place] Name 09/04/17 1041             Psychosocial Re-Evaluation   Current issues with  None Identified       Comments  No psychosocial needs identified. No interventions necessary.        Expected Outcomes  Pt will continue to exhibit a postitive outlook with good coping skills.        Interventions  Encouraged to attend Cardiac Rehabilitation for the exercise       Continue Psychosocial Services   No Follow up required          Psychosocial Discharge (Final Psychosocial Re-Evaluation): Psychosocial Re-Evaluation - 09/04/17 1041      Psychosocial Re-Evaluation   Current issues with  None Identified    Comments  No psychosocial needs identified. No interventions necessary.    Expected Outcomes  Pt will continue to exhibit a postitive outlook with good coping skills.     Interventions  Encouraged to attend Cardiac Rehabilitation for the exercise    Continue Psychosocial Services   No Follow up required       Vocational Rehabilitation: Provide vocational rehab assistance to qualifying candidates.   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 Participant will state understanding/return demonstration of topics presented.  Learning Barriers/Preferences: Learning Barriers/Preferences - 08/27/17 1104      Learning Barriers/Preferences   Learning Barriers  Sight    Learning Preferences  None       Education Topics: Count Your Pulse:  -Group instruction provided by verbal instruction, demonstration, patient participation and written materials to support subject.  Instructors address importance of being able to find your pulse and how to count your pulse when at home  without a heart monitor.  Patients get hands on experience counting their pulse with staff help and individually.   Heart Attack, Angina, and Risk Factor Modification:  -Group instruction provided by verbal  instruction, video, and written materials to support subject.  Instructors address signs and symptoms of angina and heart attacks.    Also discuss risk factors for heart disease and how to make changes to improve heart health risk factors.   Functional Fitness:  -Group instruction provided by verbal instruction, demonstration, patient participation, and written materials to support subject.  Instructors address safety measures for doing things around the house.  Discuss how to get up and down off the floor, how to pick things up properly, how to safely get out of a chair without assistance, and balance training.   Meditation and Mindfulness:  -Group instruction provided by verbal instruction, patient participation, and written materials to support subject.  Instructor addresses importance of mindfulness and meditation practice to help reduce stress and improve awareness.  Instructor also leads participants through a meditation exercise.    Stretching for Flexibility and Mobility:  -Group instruction provided by verbal instruction, patient participation, and written materials to support subject.  Instructors lead participants through series of stretches that are designed to increase flexibility thus improving mobility.  These stretches are additional exercise for major muscle groups that are typically performed during regular warm up and cool down.   Hands Only CPR:  -Group verbal, video, and participation provides a basic overview of AHA guidelines for community CPR. Role-play of emergencies allow participants the opportunity to practice calling for help and chest compression technique with discussion of AED use.   Hypertension: -Group verbal and written instruction that provides a basic overview of hypertension including the most recent diagnostic guidelines, risk factor reduction with self-care instructions and medication management.    Nutrition I class: Heart Healthy Eating:  -Group  instruction provided by PowerPoint slides, verbal discussion, and written materials to support subject matter. The instructor gives an explanation and review of the Therapeutic Lifestyle Changes diet recommendations, which includes a discussion on lipid goals, dietary fat, sodium, fiber, plant stanol/sterol esters, sugar, and the components of a well-balanced, healthy diet.   Nutrition II class: Lifestyle Skills:  -Group instruction provided by PowerPoint slides, verbal discussion, and written materials to support subject matter. The instructor gives an explanation and review of label reading, grocery shopping for heart health, heart healthy recipe modifications, and ways to make healthier choices when eating out.   Diabetes Question & Answer:  -Group instruction provided by PowerPoint slides, verbal discussion, and written materials to support subject matter. The instructor gives an explanation and review of diabetes co-morbidities, pre- and post-prandial blood glucose goals, pre-exercise blood glucose goals, signs, symptoms, and treatment of hypoglycemia and hyperglycemia, and foot care basics.   Diabetes Blitz:  -Group instruction provided by PowerPoint slides, verbal discussion, and written materials to support subject matter. The instructor gives an explanation and review of the physiology behind type 1 and type 2 diabetes, diabetes medications and rational behind using different medications, pre- and post-prandial blood glucose recommendations and Hemoglobin A1c goals, diabetes diet, and exercise including blood glucose guidelines for exercising safely.    Portion Distortion:  -Group instruction provided by PowerPoint slides, verbal discussion, written materials, and food models to support subject matter. The instructor gives an explanation of serving size versus portion size, changes in portions sizes over the last 20 years, and what consists of a serving from each food group.  Stress  Management:  -Group instruction provided by verbal instruction, video, and written materials to support subject matter.  Instructors review role of stress in heart disease and how to cope with stress positively.     Exercising on Your Own:  -Group instruction provided by verbal instruction, power point, and written materials to support subject.  Instructors discuss benefits of exercise, components of exercise, frequency and intensity of exercise, and end points for exercise.  Also discuss use of nitroglycerin and activating EMS.  Review options of places to exercise outside of rehab.  Review guidelines for sex with heart disease.   Cardiac Drugs I:  -Group instruction provided by verbal instruction and written materials to support subject.  Instructor reviews cardiac drug classes: antiplatelets, anticoagulants, beta blockers, and statins.  Instructor discusses reasons, side effects, and lifestyle considerations for each drug class.   Cardiac Drugs II:  -Group instruction provided by verbal instruction and written materials to support subject.  Instructor reviews cardiac drug classes: angiotensin converting enzyme inhibitors (ACE-I), angiotensin II receptor blockers (ARBs), nitrates, and calcium channel blockers.  Instructor discusses reasons, side effects, and lifestyle considerations for each drug class.   Anatomy and Physiology of the Circulatory System:  Group verbal and written instruction and models provide basic cardiac anatomy and physiology, with the coronary electrical and arterial systems. Review of: AMI, Angina, Valve disease, Heart Failure, Peripheral Artery Disease, Cardiac Arrhythmia, Pacemakers, and the ICD.   CARDIAC REHAB PHASE II EXERCISE from 09/04/2017 in [place]  Date  09/04/17  Instruction Review Code  2- Demonstrated Understanding      Other Education:  -Group or individual verbal, written, or video instructions that support the  educational goals of the cardiac rehab program.   Holiday Eating Survival Tips:  -Group instruction provided by PowerPoint slides, verbal discussion, and written materials to support subject matter. The instructor gives patients tips, tricks, and techniques to help them not only survive but enjoy the holidays despite the onslaught of food that accompanies the holidays.   Knowledge Questionnaire Score: Knowledge Questionnaire Score - 08/27/17 0917      Knowledge Questionnaire Score   Pre Score  21/24       Core Components/Risk Factors/Patient Goals at Admission: Personal Goals and Risk Factors at Admission - 08/27/17 0929      Core Components/Risk Factors/Patient Goals on Admission    Weight Management  Obesity;Yes    Intervention  Weight Management: Develop a combined nutrition and exercise program designed to reach desired caloric intake, while maintaining appropriate intake of nutrient and fiber, sodium and fats, and appropriate energy expenditure required for the weight goal.;Obesity: Provide education and appropriate resources to help participant work on and attain dietary goals.    Admit Weight  220 lb 7.4 oz (100 kg)    Expected Outcomes  Short Term: Continue to assess and modify interventions until short term weight is achieved;Long Term: Adherence to nutrition and physical activity/exercise program aimed toward attainment of established weight goal;Weight Loss: Understanding of general recommendations for a balanced deficit meal plan, which promotes 1-2 lb weight loss per week and includes a negative energy balance of [phone] kcal/d;Understanding recommendations for meals to include 15-35% energy as protein, 25-35% energy from fat, 35-60% energy from carbohydrates, less than 200mg  of dietary cholesterol, 20-35 gm of total fiber daily;Weight Maintenance: Understanding of the daily nutrition guidelines, which includes 25-35% calories from fat, 7% or less cal from saturated fats, less  than 200mg  cholesterol, less than 1.5gm  of sodium, & 5 or more servings of fruits and vegetables daily    Lipids  Yes    Intervention  Provide education and support for participant on nutrition & aerobic/resistive exercise along with prescribed medications to achieve LDL 70mg , HDL >40mg .    Expected Outcomes  Short Term: Participant states understanding of desired cholesterol values and is compliant with medications prescribed. Participant is following exercise prescription and nutrition guidelines.;Long Term: Cholesterol controlled with medications as prescribed, with individualized exercise RX and with personalized nutrition plan. Value goals: LDL < 70mg , HDL > 40 mg.    Personal Goal Other  Yes    Personal Goal  Learn physical activity parameters, resume exercise regimen safely.    Intervention  participation in CR exercise and education opportunities.    Expected Outcomes  pt will demonstrate safe exercise and physical activity participation.        Core Components/Risk Factors/Patient Goals Review:  Goals and Risk Factor Review    Row Name 09/02/17 1048 09/04/17 1041           Core Components/Risk Factors/Patient Goals Review   Personal Goals Review  Weight Management/Obesity;Lipids  Weight Management/Obesity;Lipids      Review  Pt with CAD RF.  Eager to participate in CR exercises.  Pt with CAD RF.  Pt is eager to participate in CR exercises and activities.       Expected Outcomes  Pt will participate in CR exercise, nutrition, lifestyle, and education opportunities.   Pt will participate in CR exercise, nutrition, lifestyle, and education opportunities.          Core Components/Risk Factors/Patient Goals at Discharge (Final Review):  Goals and Risk Factor Review - 09/04/17 1041      Core Components/Risk Factors/Patient Goals Review   Personal Goals Review  Weight Management/Obesity;Lipids    Review  Pt with CAD RF.  Pt is eager to participate in CR exercises and activities.      Expected Outcomes  Pt will participate in CR exercise, nutrition, lifestyle, and education opportunities.        ITP Comments: ITP Comments    Row Name 08/27/17 0917 09/04/17 1040         ITP Comments  Dr. Fransico Him, Medical Director   30 Day ITP Review.  Pt is off to a great start during his first week of cardiac rehab.  He demonstrates eagerness to learn the routine and begin exercise.          Comments: See ITP Comments.

## 2017-09-06 ENCOUNTER — Encounter (HOSPITAL_COMMUNITY)
Admission: RE | Admit: 2017-09-06 | Discharge: 2017-09-06 | Disposition: A | Discharge status: Home / Self Care | Payer: Medicare Other | Source: Ambulatory Visit

## 2017-09-06 DIAGNOSIS — I213 ST elevation (STEMI) myocardial infarction of unspecified site: Secondary | ICD-10-CM

## 2017-09-06 DIAGNOSIS — Z951 Presence of aortocoronary bypass graft: Secondary | ICD-10-CM

## 2017-09-09 ENCOUNTER — Encounter (HOSPITAL_COMMUNITY): Payer: Medicare Other

## 2017-09-11 ENCOUNTER — Encounter (HOSPITAL_COMMUNITY)
Admission: RE | Admit: 2017-09-11 | Discharge: 2017-09-11 | Disposition: A | Discharge status: Home / Self Care | Payer: Medicare Other | Source: Ambulatory Visit | Attending: Internal Medicine | Admitting: Internal Medicine

## 2017-09-11 DIAGNOSIS — Z951 Presence of aortocoronary bypass graft: Secondary | ICD-10-CM | POA: Diagnosis not present

## 2017-09-11 DIAGNOSIS — I213 ST elevation (STEMI) myocardial infarction of unspecified site: Secondary | ICD-10-CM | POA: Diagnosis not present

## 2017-09-11 DIAGNOSIS — Z48812 Encounter for surgical aftercare following surgery on the circulatory system: Secondary | ICD-10-CM | POA: Diagnosis not present

## 2017-09-13 ENCOUNTER — Encounter (HOSPITAL_COMMUNITY): Payer: Medicare Other

## 2017-09-16 ENCOUNTER — Encounter: Payer: Self-pay | Admitting: Thoracic Surgery (Cardiothoracic Vascular Surgery)

## 2017-09-16 ENCOUNTER — Ambulatory Visit (INDEPENDENT_AMBULATORY_CARE_PROVIDER_SITE_OTHER): Payer: Self-pay | Admitting: Thoracic Surgery (Cardiothoracic Vascular Surgery)

## 2017-09-16 ENCOUNTER — Encounter (HOSPITAL_COMMUNITY)
Admission: RE | Admit: 2017-09-16 | Discharge: 2017-09-16 | Disposition: A | Discharge status: Home / Self Care | Payer: Medicare Other | Source: Ambulatory Visit | Attending: Internal Medicine | Admitting: Internal Medicine

## 2017-09-16 VITALS — BP 99/63 | HR 74 | Resp 20 | Ht 73.0 in | Wt 226.0 lb

## 2017-09-16 DIAGNOSIS — Z951 Presence of aortocoronary bypass graft: Secondary | ICD-10-CM | POA: Diagnosis not present

## 2017-09-16 DIAGNOSIS — I213 ST elevation (STEMI) myocardial infarction of unspecified site: Secondary | ICD-10-CM

## 2017-09-16 DIAGNOSIS — I2510 Atherosclerotic heart disease of native coronary artery without angina pectoris: Secondary | ICD-10-CM

## 2017-09-16 DIAGNOSIS — Z48812 Encounter for surgical aftercare following surgery on the circulatory system: Secondary | ICD-10-CM | POA: Diagnosis not present

## 2017-09-16 DIAGNOSIS — Z9889 Other specified postprocedural states: Secondary | ICD-10-CM

## 2017-09-16 DIAGNOSIS — Z8679 Personal history of other diseases of the circulatory system: Secondary | ICD-10-CM

## 2017-09-16 NOTE — Progress Notes (Signed)
[address]       [place] [address]             [phone]     CARDIOTHORACIC SURGERY OFFICE NOTE  Referring Provider is Martinique, Peter M, MD  Primary Cardiologist is End, Harrell Gave, MD PCP is Lavone Orn, MD   HPI:  Patient is a 72 year old male with history of atrial fibrillation and hyperlipidemia who underwent emergent coronary artery bypass grafting x2 and Maze procedure on June 24, 2017 for critical left main coronary artery disease status post acute non-ST segment elevation myocardial infarction with recurrent persistent atrial fibrillation.  The patient's early postoperative recovery in the hospital was uneventful and he was discharged home on the fourth postoperative day.  Xarelto was resumed at the time of hospital discharge and low-dose aspirin was added due to the presence of coronary artery disease.  He was last seen in follow-up in our office on July 29, 2017 at which time he was doing well.  Since then he has continued to make excellent progress.  He has been participating in outpatient cardiac rehab program and he reports that his exercise tolerance continues to improve.  He has not had any further episodes of chest discomfort that he had been having for some time prior to surgery.  He is eager to get back to playing golf and increasing his physical activity further.  He has not had any palpitations or dizzy spells.  He remains anticoagulated using Xarelto and low-dose aspirin.  The remainder of his review of systems is unremarkable.   Current Outpatient Medications  Medication Sig Dispense Refill  . acetaminophen (TYLENOL) 500 MG tablet Take 2 tablets (1,000 mg total) by mouth every 6 (six) hours as needed. 30 tablet 0  . Ascorbic Acid (VITAMIN C) 100 MG tablet Take 100 mg by mouth daily.    Marland Kitchen aspirin EC 81 MG EC tablet Take 1 tablet (81 mg total) by mouth daily.    Marland Kitchen atorvastatin (LIPITOR) 80 MG tablet Take 1 tablet (80 mg total) by mouth  daily at 6 PM. 30 tablet 3  . cholecalciferol (VITAMIN D) 1000 units tablet Take 1,000 Units by mouth daily.    Marland Kitchen dofetilide (TIKOSYN) 500 MCG capsule Take 1 capsule (500 mcg total) by mouth 2 (two) times daily. 60 capsule 11  . metoprolol succinate (TOPROL XL) 25 MG 24 hr tablet Take 1 tablet (25 mg total) by mouth daily. Ok to take an extra 1/2 tablet prn for afib 45 tablet 11  . rivaroxaban (XARELTO) 20 MG TABS tablet Take 1 tablet (20 mg total) by mouth daily with supper. 30 tablet 6  . thiamine (VITAMIN B-1) 100 MG tablet Take 100 mg by mouth daily.    . traMADol (ULTRAM) 50 MG tablet Take 1 tablet (50 mg total) by mouth every 4 (four) hours as needed for moderate pain. 30 tablet 0   No current facility-administered medications for this visit.       Physical Exam:   BP 99/63   Pulse 74   Resp 20   Ht 6\' 1"  (1.854 m)   Wt 226 lb (102.5 kg)   SpO2 98% Comment: RA  BMI 29.82 kg/m   General:  Well-appearing  Chest:   Clear to auscultation  CV:   Regular rate and rhythm without murmur  Incisions:  Healing nicely, sternum stable  Abdomen:  Soft nontender  Extremities:  Warm and well-perfused  Diagnostic Tests:  2 channel telemetry  rhythm strip demonstrates normal sinus rhythm   Impression:  Patient is doing well and maintaining sinus rhythm nearly 3 months status post emergency coronary artery bypass grafting and Maze procedure    Plan:  We have not recommended any changes to the patient's current medications.  It might be reasonable to consider stopping Tikosyn at some point, but we will defer this to Dr. Saunders Revel and Dr. Lovena Le.  I have encouraged the patient to continue to gradually increase his physical activity as tolerated without any particular limitations.  All of his questions have been addressed.  The patient will continue to follow-up with Dr. Saunders Revel and in the [facility].  He will return to our office next February, approximately 1 year following his  surgery for routine follow-up and rhythm check.      Valentina Gu. Roxy Manns, MD 09/16/2017 3:32 PM

## 2017-09-16 NOTE — Patient Instructions (Signed)
Continue all previous medications without any changes at this time  You may resume unrestricted physical activity without any particular limitations at this time.   

## 2017-09-16 NOTE — Progress Notes (Signed)
I have reviewed a Home Exercise Prescription with [name] . Haseeb is not currently exercising at home. The patient was advised to walk 2-3 days a week for 30-45 minutes. Pt also has stationary bike at home.  Lacharles and I discussed how to progress their exercise prescription. The patient stated that they understand the exercise prescription.  We reviewed exercise guidelines, target heart rate during exercise, weather, NTG use,and endpoints for exercise. Patient is encouraged to come to me with any questions. I will continue to follow up with the patient to assist them with progression and safety.    Carma Lair MS, ACSM CEP 09/16/2017 4:32 PM

## 2017-09-18 ENCOUNTER — Encounter (HOSPITAL_COMMUNITY): Payer: Medicare Other

## 2017-09-20 ENCOUNTER — Encounter (HOSPITAL_COMMUNITY): Payer: Medicare Other

## 2017-09-23 ENCOUNTER — Encounter (HOSPITAL_COMMUNITY): Payer: Medicare Other

## 2017-09-25 ENCOUNTER — Encounter (HOSPITAL_COMMUNITY): Payer: Medicare Other

## 2017-09-27 ENCOUNTER — Ambulatory Visit: Payer: Medicare Other | Admitting: Internal Medicine

## 2017-09-27 ENCOUNTER — Encounter (HOSPITAL_COMMUNITY): Payer: Medicare Other

## 2017-09-28 ENCOUNTER — Other Ambulatory Visit: Payer: Self-pay | Admitting: Physician Assistant

## 2017-09-30 ENCOUNTER — Encounter (HOSPITAL_COMMUNITY): Payer: Medicare Other

## 2017-10-02 ENCOUNTER — Encounter (HOSPITAL_COMMUNITY): Payer: Medicare Other

## 2017-10-02 ENCOUNTER — Telehealth (HOSPITAL_COMMUNITY): Payer: Self-pay | Admitting: *Deleted

## 2017-10-02 NOTE — Telephone Encounter (Signed)
Pt called in regards to absences from [place]. VM left for pt to callback.

## 2017-10-03 ENCOUNTER — Encounter (HOSPITAL_COMMUNITY): Payer: Self-pay

## 2017-10-03 NOTE — Progress Notes (Signed)
Cardiac Individual Treatment Plan  Patient Details  Name: Hector Clark MRN: [id number] Date of Birth: 06-10-1945 Referring Provider:     CARDIAC REHAB PHASE II ORIENTATION from 08/27/2017 in [facility]  Referring Provider  End, Harrell Gave, MD.      Initial Encounter Date:    CARDIAC REHAB PHASE II ORIENTATION from 08/27/2017 in [facility]  Date  08/27/17  Referring Provider  End, Harrell Gave, MD.      Visit Diagnosis: ST elevation myocardial infarction (STEMI), unspecified artery ([place])  S/P CABG (coronary artery bypass graft)  Patient's Home Medications on Admission:  Current Outpatient Medications:  .  acetaminophen (TYLENOL) 500 MG tablet, Take 2 tablets (1,000 mg total) by mouth every 6 (six) hours as needed., Disp: 30 tablet, Rfl: 0 .  Ascorbic Acid (VITAMIN C) 100 MG tablet, Take 100 mg by mouth daily., Disp: , Rfl:  .  aspirin EC 81 MG EC tablet, Take 1 tablet (81 mg total) by mouth daily., Disp: , Rfl:  .  atorvastatin (LIPITOR) 80 MG tablet, TAKE 1 TABLET BY MOUTH ONCE DAILY AT  6  PM, Disp: 90 tablet, Rfl: 1 .  cholecalciferol (VITAMIN D) 1000 units tablet, Take 1,000 Units by mouth daily., Disp: , Rfl:  .  dofetilide (TIKOSYN) 500 MCG capsule, Take 1 capsule (500 mcg total) by mouth 2 (two) times daily., Disp: 60 capsule, Rfl: 11 .  metoprolol succinate (TOPROL XL) 25 MG 24 hr tablet, Take 1 tablet (25 mg total) by mouth daily. Ok to take an extra 1/2 tablet prn for afib, Disp: 45 tablet, Rfl: 11 .  rivaroxaban (XARELTO) 20 MG TABS tablet, Take 1 tablet (20 mg total) by mouth daily with supper., Disp: 30 tablet, Rfl: 6 .  thiamine (VITAMIN B-1) 100 MG tablet, Take 100 mg by mouth daily., Disp: , Rfl:  .  traMADol (ULTRAM) 50 MG tablet, Take 1 tablet (50 mg total) by mouth every 4 (four) hours as needed for moderate pain., Disp: 30 tablet, Rfl: 0  Past Medical History: Past Medical History:  Diagnosis Date   . Chest pain    with nagative cardiolite  . Gout    occational flare  . Hyperlipemia   . Persistent atrial fibrillation ([place])   . Peyronie disease   . 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 S/P Maze operation for atrial fibrillation 06/24/2017   Left atrial lesion set using bipolar radiofrequency and cryothermy ablation via conventional median sternotomy with clipping of LA appendage  . Visit for monitoring Tikosyn therapy 06/18/2017    Tobacco Use: Social History   Tobacco Use  Smoking Status Never Smoker  Smokeless Tobacco Never Used    Labs: Recent Review Flowsheet Data    Labs for ITP Cardiac and Pulmonary Rehab Latest Ref Rng & Units 06/25/2017 06/25/2017 06/25/2017 06/25/2017 08/20/2017   Cholestrol 100 - 199 mg/dL - - - - 110   LDLCALC 0 - 99 mg/dL - - - - 50   HDL >39 mg/dL - - - - 44   Trlycerides 0 - 149 mg/dL - - - - 82   PHART 7.350 - 7.450 7.290(L) 7.289(L) 7.369 - -   PCO2ART 32.0 - 48.0 mmHg 43.0 45.0 35.9 - -   HCO3 20.0 - 28.0 mmol/L 20.8 21.7 20.8 - -   TCO2 22 - 32 mmol/L 22 23 22  21(L) -   ACIDBASEDEF  0.0 - 2.0 mmol/L 6.0(H) 5.0(H) 4.0(H) - -   O2SAT % 94.0 97.0 96.0 - -      Capillary Blood Glucose: Lab Results  Component Value Date   GLUCAP 144 (H) 06/28/2017   GLUCAP 113 (H) 06/28/2017   GLUCAP 115 (H) 06/27/2017   GLUCAP 144 (H) 06/27/2017   GLUCAP 147 (H) 06/27/2017     Exercise Target Goals:    Exercise Program Goal: Individual exercise prescription set using results from initial 6 min walk test and THRR while considering  patient's activity barriers and safety.   Exercise Prescription Goal: Initial exercise prescription builds to 30-45 minutes a day of aerobic activity, 2-3 days per week.  Home exercise guidelines will be given to patient during program as part of exercise prescription that the participant will acknowledge.  Activity Barriers & Risk Stratification: Activity Barriers & Cardiac Risk  Stratification - 08/27/17 1041      Activity Barriers & Cardiac Risk Stratification   Activity Barriers  None    Cardiac Risk Stratification  High       6 Minute Walk: 6 Minute Walk    Row Name 08/27/17 1034         6 Minute Walk   Phase  Initial     Distance  1516 feet     Walk Time  6 minutes     # of Rest Breaks  0     MPH  2.87     METS  3.03     RPE  11     VO2 Peak  10.6     Symptoms  No     Resting HR  68 bpm     Resting BP  104/64     Resting Oxygen Saturation   97 %     Exercise Oxygen Saturation  during 6 min walk  98 %     Max Ex. HR  83 bpm     Max Ex. BP  120/80     2 Minute Post BP  106/60        Oxygen Initial Assessment:   Oxygen Re-Evaluation:   Oxygen Discharge (Final Oxygen Re-Evaluation):   Initial Exercise Prescription: Initial Exercise Prescription - 08/27/17 1000      Date of Initial Exercise RX and Referring Provider   Date  08/27/17    Referring Provider  End, Harrell Gave, MD.      Treadmill   MPH  2.6    Grade  0    Minutes  10    METs  2.99      Bike   Level  1.1    Minutes  10    METs  3.08      NuStep   Level  4    SPM  85    Minutes  10    METs  3      Prescription Details   Frequency (times per week)  3    Duration  Progress to 30 minutes of continuous aerobic without signs/symptoms of physical distress      Intensity   THRR 40-80% of Max Heartrate  60-120    Ratings of Perceived Exertion  11-13    Perceived Dyspnea  0-4      Progression   Progression  Continue to progress workloads to maintain intensity without signs/symptoms of physical distress.      Resistance Training   Training Prescription  Yes    Weight  4lbs    Reps  10-15  Perform Capillary Blood Glucose checks as needed.  Exercise Prescription Changes: Exercise Prescription Changes    Row Name 09/02/17 1000 09/16/17 1124           Response to Exercise   Blood Pressure (Admit)  108/70  122/74      Blood Pressure (Exercise)   132/74  140/80      Blood Pressure (Exit)  118/68  106/70      Heart Rate (Admit)  78 bpm  65 bpm      Heart Rate (Exercise)  97 bpm  95 bpm      Heart Rate (Exit)  75 bpm  76 bpm      Rating of Perceived Exertion (Exercise)  12  12      Perceived Dyspnea (Exercise)  0  0      Symptoms  None  None      Comments  Pt oriented to exercise equipment  -      Duration  Progress to 30 minutes of  aerobic without signs/symptoms of physical distress  Continue with 30 min of aerobic exercise without signs/symptoms of physical distress.      Intensity  THRR unchanged  THRR unchanged        Progression   Progression  Continue to progress workloads to maintain intensity without signs/symptoms of physical distress.  Continue to progress workloads to maintain intensity without signs/symptoms of physical distress.      Average METs  2.79  2.92        Resistance Training   Training Prescription  Yes  Yes      Weight  4lbs  4lbs      Reps  10-15  10-15      Time  10 Minutes  10 Minutes        Interval Training   Interval Training  No  No        Treadmill   MPH  2.6  2.6      Grade  0  0      Minutes  10  10      METs  2.99  2.99        Bike   Level  1.1  1.1      Minutes  10  10      METs  3.08  3.07        NuStep   Level  4  4      SPM  85  85      Minutes  10  10      METs  2.3  2.7         Exercise Comments: Exercise Comments    Row Name 09/02/17 1050 09/16/17 1633 10/02/17 1523       Exercise Comments  Pt's first day of rehab went well. Pt responded well to exercise prescription and was oriented to exercise equipment. Will continue to monitor and progress pt as tolerated.   Reviewed HEP with pt today. Pt plans to exercise 2-3 days a week for 20-30 minutes. Pt will plan to walk and also use bike at home. Will continue to monitor and follow up with pt regarding home exercise plan.   Unable to review METs and Goals with pt due to absence in rehab. Will follow up with pt regarding  status in program.         Exercise Goals and Review: Exercise Goals    Row Name 08/27/17 1037  Exercise Goals   Increase Physical Activity  Yes       Intervention  Provide advice, education, support and counseling about physical activity/exercise needs.;Develop an individualized exercise prescription for aerobic and resistive training based on initial evaluation findings, risk stratification, comorbidities and participant's personal goals.       Expected Outcomes  Short Term: Attend rehab on a regular basis to increase amount of physical activity.;Long Term: Exercising regularly at least 3-5 days a week.;Long Term: Add in home exercise to make exercise part of routine and to increase amount of physical activity.       Increase Strength and Stamina  Yes       Intervention  Provide advice, education, support and counseling about physical activity/exercise needs.;Develop an individualized exercise prescription for aerobic and resistive training based on initial evaluation findings, risk stratification, comorbidities and participant's personal goals.       Expected Outcomes  Short Term: Increase workloads from initial exercise prescription for resistance, speed, and METs.;Short Term: Perform resistance training exercises routinely during rehab and add in resistance training at home;Long Term: Improve cardiorespiratory fitness, muscular endurance and strength as measured by increased METs and functional capacity (6MWT)       Able to understand and use rate of perceived exertion (RPE) scale  Yes       Intervention  Provide education and explanation on how to use RPE scale       Expected Outcomes  Short Term: Able to use RPE daily in rehab to express subjective intensity level;Long Term:  Able to use RPE to guide intensity level when exercising independently       Knowledge and understanding of Target Heart Rate Range (THRR)  Yes       Intervention  Provide education and explanation of  THRR including how the numbers were predicted and where they are located for reference       Expected Outcomes  Short Term: Able to state/look up THRR;Long Term: Able to use THRR to govern intensity when exercising independently;Short Term: Able to use daily as guideline for intensity in rehab       Able to check pulse independently  Yes       Intervention  Provide education and demonstration on how to check pulse in carotid and radial arteries.;Review the importance of being able to check your own pulse for safety during independent exercise       Expected Outcomes  Long Term: Able to check pulse independently and accurately;Short Term: Able to explain why pulse checking is important during independent exercise       Understanding of Exercise Prescription  Yes       Intervention  Provide education, explanation, and written materials on patient's individual exercise prescription       Expected Outcomes  Short Term: Able to explain program exercise prescription;Long Term: Able to explain home exercise prescription to exercise independently          Exercise Goals Re-Evaluation : Exercise Goals Re-Evaluation    Row Name 09/16/17 1636             Exercise Goal Re-Evaluation   Exercise Goals Review  Increase Physical Activity;Increase Strength and Stamina;Able to understand and use rate of perceived exertion (RPE) scale;Knowledge and understanding of Target Heart Rate Range (THRR);Understanding of Exercise Prescription;Able to check pulse independently       Comments  Reviewed Home Exercise Program. Also reviewed weather conditions, endpoints of exercise, NTG use, THRR, RPE Scale, warm up and cool  down.        Expected Outcomes  Pt will begin to exercise 2-3 days at home for 30-45 minutes. Pt will walk and use stationary bike at home. Will continue to monitor pt.            Discharge Exercise Prescription (Final Exercise Prescription Changes): Exercise Prescription Changes - 09/16/17 1124       Response to Exercise   Blood Pressure (Admit)  122/74    Blood Pressure (Exercise)  140/80    Blood Pressure (Exit)  106/70    Heart Rate (Admit)  65 bpm    Heart Rate (Exercise)  95 bpm    Heart Rate (Exit)  76 bpm    Rating of Perceived Exertion (Exercise)  12    Perceived Dyspnea (Exercise)  0    Symptoms  None    Duration  Continue with 30 min of aerobic exercise without signs/symptoms of physical distress.    Intensity  THRR unchanged      Progression   Progression  Continue to progress workloads to maintain intensity without signs/symptoms of physical distress.    Average METs  2.92      Resistance Training   Training Prescription  Yes    Weight  4lbs    Reps  10-15    Time  10 Minutes      Interval Training   Interval Training  No      Treadmill   MPH  2.6    Grade  0    Minutes  10    METs  2.99      Bike   Level  1.1    Minutes  10    METs  3.07      NuStep   Level  4    SPM  85    Minutes  10    METs  2.7       Nutrition:  Target Goals: Understanding of nutrition guidelines, daily intake of sodium 1500mg , cholesterol 200mg , calories 30% from fat and 7% or less from saturated fats, daily to have 5 or more servings of fruits and vegetables.  Biometrics: Pre Biometrics - 08/27/17 1038      Pre Biometrics   Height  6\' 1"  (1.854 m)    Weight  220 lb 7.4 oz (100 kg)    Waist Circumference  43 inches    Hip Circumference  43.5 inches    Waist to Hip Ratio  0.99 %    BMI (Calculated)  29.09    Triceps Skinfold  30 mm    % Body Fat  31.9 %    Grip Strength  46.5 kg    Flexibility  0 in    Single Leg Stand  5.09 seconds        Nutrition Therapy Plan and Nutrition Goals: Nutrition Therapy & Goals - 08/27/17 1201      Nutrition Therapy   Diet  Heart Healthy      Personal Nutrition Goals   Nutrition Goal  Pt to identify food quantities necessary to achieve weight loss of 6-24 lb (2.7-10.9 kg) at graduation from cardiac rehab.         Intervention Plan   Intervention  Prescribe, educate and counsel regarding individualized specific dietary modifications aiming towards targeted core components such as weight, hypertension, lipid management, diabetes, heart failure and other comorbidities.    Expected Outcomes  Short Term Goal: Understand basic principles of dietary content, such as calories, fat, sodium, cholesterol  and nutrients.;Long Term Goal: Adherence to prescribed nutrition plan.       Nutrition Assessments: Nutrition Assessments - 08/27/17 1201      MEDFICTS Scores   Pre Score  45       Nutrition Goals Re-Evaluation:   Nutrition Goals Re-Evaluation:   Nutrition Goals Discharge (Final Nutrition Goals Re-Evaluation):   Psychosocial: Target Goals: Acknowledge presence or absence of significant depression and/or stress, maximize coping skills, provide positive support system. Participant is able to verbalize types and ability to use techniques and skills needed for reducing stress and depression.  Initial Review & Psychosocial Screening: Initial Psych Review & Screening - 08/27/17 0918      Initial Review   Current issues with  None Identified      Family Dynamics   Good Support System?  Yes God, spouse, family, friends       Barriers   Psychosocial barriers to participate in program  There are no identifiable barriers or psychosocial needs.      Screening Interventions   Interventions  Encouraged to exercise       Quality of Life Scores: Quality of Life - 08/27/17 0928      Quality of Life Scores   Health/Function Pre  27.6 %    Socioeconomic Pre  30 %    Psych/Spiritual Pre  29.14 %    Family Pre  30 %    GLOBAL Pre  28.8 %      Scores of 19 and below usually indicate a poorer quality of life in these areas.  A difference of  2-3 points is a clinically meaningful difference.  A difference of 2-3 points in the total score of the Quality of Life Index has been associated with significant  improvement in overall quality of life, self-image, physical symptoms, and general health in studies assessing change in quality of life.  PHQ-9: Recent Review Flowsheet Data    Depression screen [facility] 2/9 09/02/2017 10/16/2014   Decreased Interest 0 0   Down, Depressed, Hopeless 0 0   PHQ - 2 Score 0 0     Interpretation of Total Score  Total Score Depression Severity:  1-4 = Minimal depression, 5-9 = Mild depression, 10-14 = Moderate depression, 15-19 = Moderately severe depression, 20-27 = Severe depression   Psychosocial Evaluation and Intervention: Psychosocial Evaluation - 09/02/17 1104      Psychosocial Evaluation & Interventions   Interventions  Encouraged to exercise with the program and follow exercise prescription    Comments  No psychosoical needs identified. No intervnetions necessary.  Pt enjoys participating in Bible study and other activities with his church.     Expected Outcomes  Pt will exhibit a positive outlook with good coping skills.     Continue Psychosocial Services   No Follow up required       Psychosocial Re-Evaluation: Psychosocial Re-Evaluation    [place] Name 09/04/17 1041 10/02/17 1337           Psychosocial Re-Evaluation   Current issues with  None Identified  None Identified      Comments  No psychosocial needs identified. No interventions necessary.  No psychosocial needs identified. No interventions necessary.      Expected Outcomes  Pt will continue to exhibit a postitive outlook with good coping skills.   Pt will continue to exhibit a postitive outlook with good coping skills.       Interventions  Encouraged to attend Cardiac Rehabilitation for the exercise  Encouraged to attend Cardiac  Rehabilitation for the exercise      Continue Psychosocial Services   No Follow up required  No Follow up required         Psychosocial Discharge (Final Psychosocial Re-Evaluation): Psychosocial Re-Evaluation - 10/02/17 1337      Psychosocial Re-Evaluation    Current issues with  None Identified    Comments  No psychosocial needs identified. No interventions necessary.    Expected Outcomes  Pt will continue to exhibit a postitive outlook with good coping skills.     Interventions  Encouraged to attend Cardiac Rehabilitation for the exercise    Continue Psychosocial Services   No Follow up required       Vocational Rehabilitation: Provide vocational rehab assistance to qualifying candidates.   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 Participant will state understanding/return demonstration of topics presented.  Learning Barriers/Preferences: Learning Barriers/Preferences - 08/27/17 1104      Learning Barriers/Preferences   Learning Barriers  Sight    Learning Preferences  None       Education Topics: Count Your Pulse:  -Group instruction provided by verbal instruction, demonstration, patient participation and written materials to support subject.  Instructors address importance of being able to find your pulse and how to count your pulse when at home without a heart monitor.  Patients get hands on experience counting their pulse with staff help and individually.   Heart Attack, Angina, and Risk Factor Modification:  -Group instruction provided by verbal instruction, video, and written materials to support subject.  Instructors address signs and symptoms of angina and heart attacks.    Also discuss risk factors for heart disease and how to make changes to improve heart health risk factors.   Functional Fitness:  -Group instruction provided by verbal instruction, demonstration, patient participation, and written materials to support subject.   Instructors address safety measures for doing things around the house.  Discuss how to get up and down off the floor, how to pick things up properly, how to safely get out of a chair without assistance, and balance training.   Meditation and Mindfulness:  -Group instruction provided by verbal instruction, patient participation, and written materials to support subject.  Instructor addresses importance of mindfulness and meditation practice to help reduce stress and improve awareness.  Instructor also leads participants through a meditation exercise.    Stretching for Flexibility and Mobility:  -Group instruction provided by verbal instruction, patient participation, and written materials to support subject.  Instructors lead participants through series of stretches that are designed to increase flexibility thus improving mobility.  These stretches are additional exercise for major muscle groups that are typically performed during regular warm up and cool down.   Hands Only CPR:  -Group verbal, video, and participation provides a basic overview of AHA guidelines for community CPR. Role-play of emergencies allow participants the opportunity to practice calling for help and chest compression technique with discussion of AED use.   Hypertension: -Group verbal and written instruction that provides a basic overview of hypertension including the most recent diagnostic guidelines, risk factor reduction with self-care instructions and medication management.    Nutrition I class: Heart Healthy Eating:  -Group instruction provided by PowerPoint slides, verbal discussion,  and written materials to support subject matter. The instructor gives an explanation and review of the Therapeutic Lifestyle Changes diet recommendations, which includes a discussion on lipid goals, dietary fat, sodium, fiber, plant stanol/sterol esters, sugar, and the components of a well-balanced, healthy diet.   Nutrition II class:  Lifestyle Skills:  -Group instruction provided by PowerPoint slides, verbal discussion, and written materials to support subject matter. The instructor gives an explanation and review of label reading, grocery shopping for heart health, heart healthy recipe modifications, and ways to make healthier choices when eating out.   Diabetes Question & Answer:  -Group instruction provided by PowerPoint slides, verbal discussion, and written materials to support subject matter. The instructor gives an explanation and review of diabetes co-morbidities, pre- and post-prandial blood glucose goals, pre-exercise blood glucose goals, signs, symptoms, and treatment of hypoglycemia and hyperglycemia, and foot care basics.   Diabetes Blitz:  -Group instruction provided by PowerPoint slides, verbal discussion, and written materials to support subject matter. The instructor gives an explanation and review of the physiology behind type 1 and type 2 diabetes, diabetes medications and rational behind using different medications, pre- and post-prandial blood glucose recommendations and Hemoglobin A1c goals, diabetes diet, and exercise including blood glucose guidelines for exercising safely.    Portion Distortion:  -Group instruction provided by PowerPoint slides, verbal discussion, written materials, and food models to support subject matter. The instructor gives an explanation of serving size versus portion size, changes in portions sizes over the last 20 years, and what consists of a serving from each food group.   Stress Management:  -Group instruction provided by verbal instruction, video, and written materials to support subject matter.  Instructors review role of stress in heart disease and how to cope with stress positively.     Exercising on Your Own:  -Group instruction provided by verbal instruction, power point, and written materials to support subject.  Instructors discuss benefits of exercise, components  of exercise, frequency and intensity of exercise, and end points for exercise.  Also discuss use of nitroglycerin and activating EMS.  Review options of places to exercise outside of rehab.  Review guidelines for sex with heart disease.   Cardiac Drugs I:  -Group instruction provided by verbal instruction and written materials to support subject.  Instructor reviews cardiac drug classes: antiplatelets, anticoagulants, beta blockers, and statins.  Instructor discusses reasons, side effects, and lifestyle considerations for each drug class.   Cardiac Drugs II:  -Group instruction provided by verbal instruction and written materials to support subject.  Instructor reviews cardiac drug classes: angiotensin converting enzyme inhibitors (ACE-I), angiotensin II receptor blockers (ARBs), nitrates, and calcium channel blockers.  Instructor discusses reasons, side effects, and lifestyle considerations for each drug class.   Anatomy and Physiology of the Circulatory System:  Group verbal and written instruction and models provide basic cardiac anatomy and physiology, with the coronary electrical and arterial systems. Review of: AMI, Angina, Valve disease, Heart Failure, Peripheral Artery Disease, Cardiac Arrhythmia, Pacemakers, and the ICD.   CARDIAC REHAB PHASE II EXERCISE from 09/04/2017 in [place]  Date  09/04/17  Instruction Review Code  2- Demonstrated Understanding      Other Education:  -Group or individual verbal, written, or video instructions that support the educational goals of the cardiac rehab program.   Holiday Eating Survival Tips:  -Group instruction provided by PowerPoint slides, verbal discussion, and written materials to support subject matter. The instructor gives patients tips, tricks, and techniques  to help them not only survive but enjoy the holidays despite the onslaught of food that accompanies the holidays.   Knowledge Questionnaire  Score: Knowledge Questionnaire Score - 08/27/17 0917      Knowledge Questionnaire Score   Pre Score  21/24       Core Components/Risk Factors/Patient Goals at Admission: Personal Goals and Risk Factors at Admission - 08/27/17 0929      Core Components/Risk Factors/Patient Goals on Admission    Weight Management  Obesity;Yes    Intervention  Weight Management: Develop a combined nutrition and exercise program designed to reach desired caloric intake, while maintaining appropriate intake of nutrient and fiber, sodium and fats, and appropriate energy expenditure required for the weight goal.;Obesity: Provide education and appropriate resources to help participant work on and attain dietary goals.    Admit Weight  220 lb 7.4 oz (100 kg)    Expected Outcomes  Short Term: Continue to assess and modify interventions until short term weight is achieved;Long Term: Adherence to nutrition and physical activity/exercise program aimed toward attainment of established weight goal;Weight Loss: Understanding of general recommendations for a balanced deficit meal plan, which promotes 1-2 lb weight loss per week and includes a negative energy balance of [phone] kcal/d;Understanding recommendations for meals to include 15-35% energy as protein, 25-35% energy from fat, 35-60% energy from carbohydrates, less than 200mg  of dietary cholesterol, 20-35 gm of total fiber daily;Weight Maintenance: Understanding of the daily nutrition guidelines, which includes 25-35% calories from fat, 7% or less cal from saturated fats, less than 200mg  cholesterol, less than 1.5gm of sodium, & 5 or more servings of fruits and vegetables daily    Lipids  Yes    Intervention  Provide education and support for participant on nutrition & aerobic/resistive exercise along with prescribed medications to achieve LDL 70mg , HDL >40mg .    Expected Outcomes  Short Term: Participant states understanding of desired cholesterol values and is compliant  with medications prescribed. Participant is following exercise prescription and nutrition guidelines.;Long Term: Cholesterol controlled with medications as prescribed, with individualized exercise RX and with personalized nutrition plan. Value goals: LDL < 70mg , HDL > 40 mg.    Personal Goal Other  Yes    Personal Goal  Learn physical activity parameters, resume exercise regimen safely.    Intervention  participation in CR exercise and education opportunities.    Expected Outcomes  pt will demonstrate safe exercise and physical activity participation.        Core Components/Risk Factors/Patient Goals Review:  Goals and Risk Factor Review    Row Name 09/02/17 1048 09/04/17 1041 10/02/17 1333         Core Components/Risk Factors/Patient Goals Review   Personal Goals Review  Weight Management/Obesity;Lipids  Weight Management/Obesity;Lipids  Weight Management/Obesity;Lipids     Review  Pt with CAD RF.  Eager to participate in CR exercises.  Pt with CAD RF.  Pt is eager to participate in CR exercises and activities.   Pt with few CAD RF.  Pt with recent absences from CR exercise.  Previously has been eager to participate in CR exercise.     Expected Outcomes  Pt will participate in CR exercise, nutrition, lifestyle, and education opportunities.   Pt will participate in CR exercise, nutrition, lifestyle, and education opportunities.   Pt will participate in CR exercise, nutrition, lifestyle, and education opportunities to reduce CV Risk.        Core Components/Risk Factors/Patient Goals at Discharge (Final Review):  Goals and Risk Factor  Review - 10/02/17 1333      Core Components/Risk Factors/Patient Goals Review   Personal Goals Review  Weight Management/Obesity;Lipids    Review  Pt with few CAD RF.  Pt with recent absences from CR exercise.  Previously has been eager to participate in CR exercise.    Expected Outcomes  Pt will participate in CR exercise, nutrition, lifestyle, and education  opportunities to reduce CV Risk.       ITP Comments: ITP Comments    Row Name 08/27/17 0917 09/04/17 1040 10/02/17 1331       ITP Comments  Dr. Fransico Him, Medical Director   30 Day ITP Review.  Pt is off to a great start during his first week of cardiac rehab.  He demonstrates eagerness to learn the routine and begin exercise.   30 Day ITP Review. Pt has been out lately.  Prior to absences, pt has been working towards walking at home.         Comments: See ITP Comments.

## 2017-10-04 ENCOUNTER — Encounter (HOSPITAL_COMMUNITY): Payer: Medicare Other

## 2017-10-07 ENCOUNTER — Encounter (HOSPITAL_COMMUNITY): Payer: Medicare Other

## 2017-10-09 ENCOUNTER — Encounter (HOSPITAL_COMMUNITY): Payer: Medicare Other

## 2017-10-09 ENCOUNTER — Telehealth (HOSPITAL_COMMUNITY): Payer: Self-pay | Admitting: Internal Medicine

## 2017-10-11 ENCOUNTER — Encounter (HOSPITAL_COMMUNITY): Payer: Medicare Other

## 2017-10-16 ENCOUNTER — Encounter (HOSPITAL_COMMUNITY): Payer: Medicare Other

## 2017-10-16 NOTE — Addendum Note (Signed)
Encounter addended by: Carma Lair on: 10/16/2017 3:07 PM  Actions taken: Visit Navigator Flowsheet section accepted

## 2017-10-18 ENCOUNTER — Encounter (HOSPITAL_COMMUNITY): Payer: Medicare Other

## 2017-10-21 ENCOUNTER — Encounter (HOSPITAL_COMMUNITY): Payer: Medicare Other

## 2017-10-21 NOTE — Progress Notes (Signed)
Discharge Progress Report  Patient Details  Name: Hector Clark MRN: [id number] Date of Birth: [birth year] Referring Provider:     [facility] from 08/27/2017 in [place]  Referring Provider  End, Harrell Gave, MD.       Number of Visits: 5  Reason for Discharge:  Early Exit:  Lack of attendance  Smoking History:  Social History   Tobacco Use  Smoking Status Never Smoker  Smokeless Tobacco Never Used    Diagnosis:  ST elevation myocardial infarction (STEMI), unspecified artery ([place])  S/P CABG (coronary artery bypass graft)  ADL [facility]:   Initial Exercise Prescription: Initial Exercise Prescription - 08/27/17 1000      Date of Initial Exercise RX and Referring Provider   Date  08/27/17    Referring Provider  End, Harrell Gave, MD.      Treadmill   MPH  2.6    Grade  0    Minutes  10    METs  2.99      Bike   Level  1.1    Minutes  10    METs  3.08      NuStep   Level  4    SPM  85    Minutes  10    METs  3      Prescription Details   Frequency (times per week)  3    Duration  Progress to 30 minutes of continuous aerobic without signs/symptoms of physical distress      Intensity   THRR 40-80% of Max Heartrate  60-120    Ratings of Perceived Exertion  11-13    Perceived Dyspnea  0-4      Progression   Progression  Continue to progress workloads to maintain intensity without signs/symptoms of physical distress.      Resistance Training   Training Prescription  Yes    Weight  4lbs    Reps  10-15       Discharge Exercise Prescription (Final Exercise Prescription Changes): Exercise Prescription Changes - 09/16/17 1124      Response to Exercise   Blood Pressure (Admit)  122/74    Blood Pressure (Exercise)  140/80    Blood Pressure (Exit)  106/70    Heart Rate (Admit)  65 bpm    Heart Rate (Exercise)  95 bpm    Heart Rate (Exit)  76 bpm    Rating of Perceived Exertion (Exercise)  12     Perceived Dyspnea (Exercise)  0    Symptoms  None    Duration  Continue with 30 min of aerobic exercise without signs/symptoms of physical distress.    Intensity  THRR unchanged      Progression   Progression  Continue to progress workloads to maintain intensity without signs/symptoms of physical distress.    Average METs  2.92      Resistance Training   Training Prescription  Yes    Weight  4lbs    Reps  10-15    Time  10 Minutes      Interval Training   Interval Training  No      Treadmill   MPH  2.6    Grade  0    Minutes  10    METs  2.99      Bike   Level  1.1    Minutes  10    METs  3.07      NuStep   Level  4  SPM  85    Minutes  10    METs  2.7       Functional Capacity: 6 Minute Walk    Row Name 08/27/17 1034         6 Minute Walk   Phase  Initial     Distance  1516 feet     Walk Time  6 minutes     # of Rest Breaks  0     MPH  2.87     METS  3.03     RPE  11     VO2 Peak  10.6     Symptoms  No     Resting HR  68 bpm     Resting BP  104/64     Resting Oxygen Saturation   97 %     Exercise Oxygen Saturation  during 6 min walk  98 %     Max Ex. HR  83 bpm     Max Ex. BP  120/80     2 Minute Post BP  106/60        Psychological, QOL, Others - Outcomes: PHQ 2/9: Depression screen [facility] 2/9 09/02/2017 10/16/2014  Decreased Interest 0 0  Down, Depressed, Hopeless 0 0  PHQ - 2 Score 0 0    Quality of Life: Quality of Life - 08/27/17 0928      Quality of Life Scores   Health/Function Pre  27.6 %    Socioeconomic Pre  30 %    Psych/Spiritual Pre  29.14 %    Family Pre  30 %    GLOBAL Pre  28.8 %       Personal Goals: Goals established at orientation with interventions provided to work toward goal. Personal Goals and Risk Factors at Admission - 08/27/17 0929      Core Components/Risk Factors/Patient Goals on Admission    Weight Management  Obesity;Yes    Intervention  Weight Management: Develop a combined nutrition and exercise  program designed to reach desired caloric intake, while maintaining appropriate intake of nutrient and fiber, sodium and fats, and appropriate energy expenditure required for the weight goal.;Obesity: Provide education and appropriate resources to help participant work on and attain dietary goals.    Admit Weight  220 lb 7.4 oz (100 kg)    Expected Outcomes  Short Term: Continue to assess and modify interventions until short term weight is achieved;Long Term: Adherence to nutrition and physical activity/exercise program aimed toward attainment of established weight goal;Weight Loss: Understanding of general recommendations for a balanced deficit meal plan, which promotes 1-2 lb weight loss per week and includes a negative energy balance of [phone] kcal/d;Understanding recommendations for meals to include 15-35% energy as protein, 25-35% energy from fat, 35-60% energy from carbohydrates, less than 200mg  of dietary cholesterol, 20-35 gm of total fiber daily;Weight Maintenance: Understanding of the daily nutrition guidelines, which includes 25-35% calories from fat, 7% or less cal from saturated fats, less than 200mg  cholesterol, less than 1.5gm of sodium, & 5 or more servings of fruits and vegetables daily    Lipids  Yes    Intervention  Provide education and support for participant on nutrition & aerobic/resistive exercise along with prescribed medications to achieve LDL 70mg , HDL >40mg .    Expected Outcomes  Short Term: Participant states understanding of desired cholesterol values and is compliant with medications prescribed. Participant is following exercise prescription and nutrition guidelines.;Long Term: Cholesterol controlled with medications as prescribed, with individualized exercise RX and  with personalized nutrition plan. Value goals: LDL < 70mg , HDL > 40 mg.    Personal Goal Other  Yes    Personal Goal  Learn physical activity parameters, resume exercise regimen safely.    Intervention   participation in CR exercise and education opportunities.    Expected Outcomes  pt will demonstrate safe exercise and physical activity participation.         Personal Goals Discharge: Goals and Risk Factor Review    Row Name 09/02/17 1048 09/04/17 1041 10/02/17 1333 10/16/17 1701       Core Components/Risk Factors/Patient Goals Review   Personal Goals Review  Weight Management/Obesity;Lipids  Weight Management/Obesity;Lipids  Weight Management/Obesity;Lipids  Weight Management/Obesity;Lipids    Review  Pt with CAD RF.  Eager to participate in CR exercises.  Pt with CAD RF.  Pt is eager to participate in CR exercises and activities.   Pt with few CAD RF.  Pt with recent absences from CR exercise.  Previously has been eager to participate in CR exercise.  Pt dropped from the program d/t nonattendance after 5 sessions.    Expected Outcomes  Pt will participate in CR exercise, nutrition, lifestyle, and education opportunities.   Pt will participate in CR exercise, nutrition, lifestyle, and education opportunities.   Pt will participate in CR exercise, nutrition, lifestyle, and education opportunities to reduce CV Risk.  Hector Clark will participate in exercise, lifestyle, and nutrition modification opportunities.        Exercise Goals and Review: Exercise Goals    Row Name 08/27/17 1037             Exercise Goals   Increase Physical Activity  Yes       Intervention  Provide advice, education, support and counseling about physical activity/exercise needs.;Develop an individualized exercise prescription for aerobic and resistive training based on initial evaluation findings, risk stratification, comorbidities and participant's personal goals.       Expected Outcomes  Short Term: Attend rehab on a regular basis to increase amount of physical activity.;Long Term: Exercising regularly at least 3-5 days a week.;Long Term: Add in home exercise to make exercise part of routine and to increase amount of  physical activity.       Increase Strength and Stamina  Yes       Intervention  Provide advice, education, support and counseling about physical activity/exercise needs.;Develop an individualized exercise prescription for aerobic and resistive training based on initial evaluation findings, risk stratification, comorbidities and participant's personal goals.       Expected Outcomes  Short Term: Increase workloads from initial exercise prescription for resistance, speed, and METs.;Short Term: Perform resistance training exercises routinely during rehab and add in resistance training at home;Long Term: Improve cardiorespiratory fitness, muscular endurance and strength as measured by increased METs and functional capacity (6MWT)       Able to understand and use rate of perceived exertion (RPE) scale  Yes       Intervention  Provide education and explanation on how to use RPE scale       Expected Outcomes  Short Term: Able to use RPE daily in rehab to express subjective intensity level;Long Term:  Able to use RPE to guide intensity level when exercising independently       Knowledge and understanding of Target Heart Rate Range (THRR)  Yes       Intervention  Provide education and explanation of THRR including how the numbers were predicted and where they are located for reference  Expected Outcomes  Short Term: Able to state/look up THRR;Long Term: Able to use THRR to govern intensity when exercising independently;Short Term: Able to use daily as guideline for intensity in rehab       Able to check pulse independently  Yes       Intervention  Provide education and demonstration on how to check pulse in carotid and radial arteries.;Review the importance of being able to check your own pulse for safety during independent exercise       Expected Outcomes  Long Term: Able to check pulse independently and accurately;Short Term: Able to explain why pulse checking is important during independent exercise        Understanding of Exercise Prescription  Yes       Intervention  Provide education, explanation, and written materials on patient's individual exercise prescription       Expected Outcomes  Short Term: Able to explain program exercise prescription;Long Term: Able to explain home exercise prescription to exercise independently          Nutrition & Weight - Outcomes: Pre Biometrics - 08/27/17 1038      Pre Biometrics   Height  6\' 1"  (1.854 m)    Weight  220 lb 7.4 oz (100 kg)    Waist Circumference  43 inches    Hip Circumference  43.5 inches    Waist to Hip Ratio  0.99 %    BMI (Calculated)  29.09    Triceps Skinfold  30 mm    % Body Fat  31.9 %    Grip Strength  46.5 kg    Flexibility  0 in    Single Leg Stand  5.09 seconds        Nutrition: Nutrition Therapy & Goals - 08/27/17 1201      Nutrition Therapy   Diet  Heart Healthy      Personal Nutrition Goals   Nutrition Goal  Pt to identify food quantities necessary to achieve weight loss of 6-24 lb (2.7-10.9 kg) at graduation from cardiac rehab.        Intervention Plan   Intervention  Prescribe, educate and counsel regarding individualized specific dietary modifications aiming towards targeted core components such as weight, hypertension, lipid management, diabetes, heart failure and other comorbidities.    Expected Outcomes  Short Term Goal: Understand basic principles of dietary content, such as calories, fat, sodium, cholesterol and nutrients.;Long Term Goal: Adherence to prescribed nutrition plan.       Nutrition Discharge: Nutrition Assessments - 08/27/17 1201      MEDFICTS Scores   Pre Score  45       Education Questionnaire Score: Knowledge Questionnaire Score - 08/27/17 0917      Knowledge Questionnaire Score   Pre Score  21/24

## 2017-10-21 NOTE — Addendum Note (Signed)
Encounter addended by: Noel Christmas, RN on: 10/21/2017 5:06 PM  Actions taken: Order Reconciliation Section accessed, Flowsheet data copied forward, Visit Navigator Flowsheet section accepted, Sign clinical note, Episode resolved

## 2017-10-23 ENCOUNTER — Encounter (HOSPITAL_COMMUNITY): Payer: Medicare Other

## 2017-10-25 ENCOUNTER — Encounter (HOSPITAL_COMMUNITY): Payer: Medicare Other

## 2017-10-28 ENCOUNTER — Encounter (HOSPITAL_COMMUNITY): Payer: Medicare Other

## 2017-10-30 ENCOUNTER — Encounter (HOSPITAL_COMMUNITY): Payer: Medicare Other

## 2017-11-01 ENCOUNTER — Encounter (HOSPITAL_COMMUNITY): Payer: Medicare Other

## 2017-11-04 ENCOUNTER — Encounter (HOSPITAL_COMMUNITY): Payer: Medicare Other

## 2017-11-04 ENCOUNTER — Encounter: Payer: Self-pay | Admitting: Internal Medicine

## 2017-11-04 ENCOUNTER — Ambulatory Visit (INDEPENDENT_AMBULATORY_CARE_PROVIDER_SITE_OTHER): Payer: Medicare Other | Admitting: Internal Medicine

## 2017-11-04 VITALS — BP 124/64 | HR 68 | Ht 73.0 in | Wt 227.0 lb

## 2017-11-04 DIAGNOSIS — E785 Hyperlipidemia, unspecified: Secondary | ICD-10-CM | POA: Diagnosis not present

## 2017-11-04 DIAGNOSIS — I10 Essential (primary) hypertension: Secondary | ICD-10-CM | POA: Diagnosis not present

## 2017-11-04 DIAGNOSIS — I255 Ischemic cardiomyopathy: Secondary | ICD-10-CM | POA: Diagnosis not present

## 2017-11-04 DIAGNOSIS — I481 Persistent atrial fibrillation: Secondary | ICD-10-CM | POA: Diagnosis not present

## 2017-11-04 DIAGNOSIS — I2510 Atherosclerotic heart disease of native coronary artery without angina pectoris: Secondary | ICD-10-CM

## 2017-11-04 DIAGNOSIS — I4819 Other persistent atrial fibrillation: Secondary | ICD-10-CM

## 2017-11-04 NOTE — Patient Instructions (Addendum)
Medication Instructions:  Your physician recommends that you continue on your current medications as directed. Please refer to the Current Medication list given to you today.  -- If you need a refill on your cardiac medications before your next appointment, please call your pharmacy. --  Labwork: None ordered  Testing/Procedures:PLEASE SCHEDULE SAME DAY AS Dr Rayann Heman  appt.   Your physician has requested that you have an echocardiogram. Echocardiography is a painless test that uses sound waves to create images of your heart. It provides your doctor with information about the size and shape of your heart and how well your heart's chambers and valves are working. This procedure takes approximately one hour. There are no restrictions for this procedure.   Follow-Up: NEXT Dr Rayann Heman Availability   Your physician wants you to follow-up in: 3-4 months with Dr. Saunders Revel.    Thank you for choosing [facility]!!    Any Other Special Instructions Will Be Listed Below (If Applicable).    Echocardiogram An echocardiogram, or echocardiography, uses sound waves (ultrasound) to produce an image of your heart. The echocardiogram is simple, painless, obtained within a short period of time, and offers valuable information to your health care provider. The images from an echocardiogram can provide information such as:  Evidence of coronary artery disease (CAD).  Heart size.  Heart muscle function.  Heart valve function.  Aneurysm detection.  Evidence of a past heart attack.  Fluid buildup around the heart.  Heart muscle thickening.  Assess heart valve function.  Tell a health care provider about:  Any allergies you have.  All medicines you are taking, including vitamins, herbs, eye drops, creams, and over-the-counter medicines.  Any problems you or family members have had with anesthetic medicines.  Any blood disorders you have.  Any surgeries you have had.  Any medical conditions  you have.  Whether you are pregnant or may be pregnant. What happens before the procedure? No special preparation is needed. Eat and drink normally. What happens during the procedure?  In order to produce an image of your heart, gel will be applied to your chest and a wand-like tool (transducer) will be moved over your chest. The gel will help transmit the sound waves from the transducer. The sound waves will harmlessly bounce off your heart to allow the heart images to be captured in real-time motion. These images will then be recorded.  You may need an IV to receive a medicine that improves the quality of the pictures. What happens after the procedure? You may return to your normal schedule including diet, activities, and medicines, unless your health care provider tells you otherwise. This information is not intended to replace advice given to you by your health care provider. Make sure you discuss any questions you have with your health care provider. Document Released: 05/04/2000 Document Revised: 12/24/2015 Document Reviewed: 01/12/2013 Elsevier Interactive Patient Education  2017 [employer].

## 2017-11-04 NOTE — Progress Notes (Signed)
Follow-up Outpatient Visit Date: 11/04/2017  Primary Care Provider: Lavone Orn, MD [place] [address] [place] [address]  Chief Complaint: Follow-up CAD and a-fib  HPI:  Mr. Kobashigawa is a 72 y.o. year-old male with history of coronary artery disease status post emergent CABG in 06/2017, persistent atrial fibrillation status post Maze procedure, and hyperlipidemia, who presents for follow-up of coronary artery disease and atrial fibrillation.  He was hospitalized in late January/early February for dofetilide loading per EP.  He noted intermittent chest pain thought to be due to atrial fibrillation.  However, he returned to the ED shortly after his dofetilide load with chest pain and was found to have high-risk NSTEMI with critical LMCA and LCx disease.  He underwent emergent CABG and Maze procedure, which he tolerated well.  He was last seen in our office on 07/12/17 by Robbie Lis, PA, at which time he was doing well.  Today, Mr. Hassey reports that he feels the best that he has felt in years.  His exertional dyspnea is markedly improved.  He notes intermittent soreness about the sternotomy site but otherwise feels well.  He denies palpitations, lightheadedness, orthopnea, PND, and edema.  He is not longer participating in cardiac rehab but is trying to walk and also plans to start biking in the near future.  He has not had any significant bleeding but raises the possibility of coming of dofetilide and rivaroxaban, given that he underwent Maze procedure and left atrial appendage clipping at the time of CABG in 06/2017.  --------------------------------------------------------------------------------------------------  Cardiovascular History & Procedures: Cardiovascular Problems:  Coronary artery disease (NSTEMI) s/p emergent CABG (06/2017)  Atrial fibrillation s/p Maze procedure (06/2017)  Risk Factors:  Known CAD, male gender, hyperlipidemia, and age greater than  60  Cath/PCI:  LHC (24/19): LMCA with 99% stenosis.  LAD normal. LCx with 99% ostial/proximal lesion.  RCA with mild proximal and midvessel disease.  LVEF 35-45% with mid/apical anterior and apical inferior hypokinesis..  CV Surgery:  CABG/Maze procedure (06/24/17, Dr. Roxy Manns): LIMA-LAD and SVG-OM  EP Procedures and Devices:  DCCV (03/25/17): Successful cardioversion to NSR  24-hour Holter monitor (01/22/17): Atrial fibrillation with episodes of PVCs and nonsustained ventricular tachycardia versus aberrancy.  Non-Invasive Evaluation(s):  Right groin ultrasound (08/26/17): No pseudoaneurysm, AV fistula, or DVT.  TEE (06/24/17): Normal LV size.  LVEF 35-40%.  Trace AI.  Trace MR.  Normal RV size and function.  Trace TR.  Trace PR.  Pharmacologic MPI (01/04/17): Low risk study with apical thinning versus small infarct. No ischemia. Study not gated due to atrial fibrillation.  TTE (12/20/16): Normal LV size and wall thickness. LVEF 50-55%. Unable to assess diastolic function. Mild mitral regurgitation. Normal RV size and function.  Myocardial perfusion stress test approximately 10 years ago: Normal per patient report.   Recent CV Pertinent Labs: Lab Results  Component Value Date   CHOL 110 08/20/2017   HDL 44 08/20/2017   LDLCALC 50 08/20/2017   TRIG 82 08/20/2017   CHOLHDL 2.5 08/20/2017   INR 1.32 06/24/2017   K 4.5 06/28/2017   MG 2.3 06/25/2017   BUN 23 (H) 06/28/2017   BUN 13 03/20/2017   CREATININE 0.88 06/28/2017    Past medical and surgical history were reviewed and updated in EPIC.  Current Meds  Medication Sig  . acetaminophen (TYLENOL) 500 MG tablet Take 2 tablets (1,000 mg total) by mouth every 6 (six) hours as needed.  . Ascorbic Acid (VITAMIN C) 100 MG tablet Take 100  mg by mouth daily.  Marland Kitchen aspirin EC 81 MG EC tablet Take 1 tablet (81 mg total) by mouth daily.  Marland Kitchen atorvastatin (LIPITOR) 80 MG tablet TAKE 1 TABLET BY MOUTH ONCE DAILY AT  6  PM  . cholecalciferol  (VITAMIN D) 1000 units tablet Take 1,000 Units by mouth daily.  Marland Kitchen dofetilide (TIKOSYN) 500 MCG capsule Take 1 capsule (500 mcg total) by mouth 2 (two) times daily.  . metoprolol succinate (TOPROL XL) 25 MG 24 hr tablet Take 1 tablet (25 mg total) by mouth daily. Ok to take an extra 1/2 tablet prn for afib  . rivaroxaban (XARELTO) 20 MG TABS tablet Take 1 tablet (20 mg total) by mouth daily with supper.  . thiamine (VITAMIN B-1) 100 MG tablet Take 100 mg by mouth daily.  . traMADol (ULTRAM) 50 MG tablet Take 1 tablet (50 mg total) by mouth every 4 (four) hours as needed for moderate pain.    Allergies: Patient has no known allergies.  Social History   Tobacco Use  . Smoking status: Never Smoker  . Smokeless tobacco: Never Used  Substance Use Topics  . Alcohol use: Yes    Comment: 1 beer per month  . Drug use: No    Family History  Problem Relation Age of Onset  . Liver cancer Mother 74  . Esophageal cancer Father 69  . Healthy Daughter   . Heart disease Neg Hx     Review of Systems: A 12-system review of systems was performed and was negative except as noted in the HPI.  --------------------------------------------------------------------------------------------------  Physical Exam: BP 124/64   Pulse 68   Ht 6\' 1"  (1.854 m)   Wt 227 lb (103 kg)   BMI 29.95 kg/m   General:  NAD HEENT: No conjunctival pallor or scleral icterus. Moist mucous membranes.  OP clear. Neck: Supple without lymphadenopathy, thyromegaly, JVD, or HJR. No carotid bruit. Lungs: Normal work of breathing. Clear to auscultation bilaterally without wheezes or crackles. Heart: Regular rate and rhythm without murmurs, rubs, or gallops. Non-displaced PMI. Abd: Bowel sounds present. Soft, NT/ND without hepatosplenomegaly Ext: No lower extremity edema. Radial, PT, and DP pulses are 2+ bilaterally. Skin: Warm and dry without rash.  Median sternotomy incision is well-healed.  EKG:  NSR with inferior  Q-waves.  Lab Results  Component Value Date   WBC 14.3 (H) 06/28/2017   HGB 11.2 (L) 06/28/2017   HCT 35.0 (L) 06/28/2017   MCV 93.8 06/28/2017   PLT 142 (L) 06/28/2017    Lab Results  Component Value Date   NA 135 06/28/2017   K 4.5 06/28/2017   CL 99 (L) 06/28/2017   CO2 25 06/28/2017   BUN 23 (H) 06/28/2017   CREATININE 0.88 06/28/2017   GLUCOSE 136 (H) 06/28/2017   ALT 21 08/20/2017    Lab Results  Component Value Date   CHOL 110 08/20/2017   HDL 44 08/20/2017   LDLCALC 50 08/20/2017   TRIG 82 08/20/2017   CHOLHDL 2.5 08/20/2017    --------------------------------------------------------------------------------------------------  ASSESSMENT AND PLAN: Coronary artery disease without angina Mr. Goulette continues to recover well without significant angina or shortness of breath.  We will continue with aggressive secondary prevention.  Though he presented with ACS, he is on aspirin and rivaroxab an in lieu of dual antiplatelet therapy.  I encouraged him to exercise regularly, as he is not interested in rejoining cardiac rehab.  Ischemic cardiomyopathy LVEF at the time of NSTEMI in February was moderately reduced (previously normal).  Mr. Robarts appears euvolemic and well-compensated with NYHA class II symptoms.  We will continue metoprolol and recheck LVEF with TTE at his convenience.  If LVEF remains depressed, we will need to consider adding ACEI/ARB.  Persistent atrial fibrillation No symptoms or recurrence.  EKG today shows NSR with reasonable QTc on dofetilide.  I will have Mr. Antenucci follow-up with Dr. Rayann Heman to discuss continuation of dofetilide and rivaroxaban in light of interval Maze procedure and LAA clipping.  We will continue metoprolol.  Hyperlipidemia LDL at goal (<70).  Continue atorvastatin 80 mg daily.  Follow-up: Return to clinic in 3-4 months.  Nelva Bush, MD 11/04/2017 9:40 AM

## 2017-11-06 ENCOUNTER — Encounter (HOSPITAL_COMMUNITY): Payer: Medicare Other

## 2017-11-06 ENCOUNTER — Other Ambulatory Visit: Payer: Self-pay

## 2017-11-06 ENCOUNTER — Encounter: Payer: Self-pay | Admitting: Internal Medicine

## 2017-11-06 ENCOUNTER — Ambulatory Visit (HOSPITAL_COMMUNITY): Payer: Medicare Other | Attending: Cardiology

## 2017-11-06 ENCOUNTER — Ambulatory Visit (INDEPENDENT_AMBULATORY_CARE_PROVIDER_SITE_OTHER): Payer: Medicare Other | Admitting: Internal Medicine

## 2017-11-06 VITALS — BP 120/70 | HR 64 | Ht 73.0 in | Wt 229.0 lb

## 2017-11-06 DIAGNOSIS — I481 Persistent atrial fibrillation: Secondary | ICD-10-CM

## 2017-11-06 DIAGNOSIS — I255 Ischemic cardiomyopathy: Secondary | ICD-10-CM | POA: Diagnosis not present

## 2017-11-06 DIAGNOSIS — Z951 Presence of aortocoronary bypass graft: Secondary | ICD-10-CM | POA: Diagnosis not present

## 2017-11-06 DIAGNOSIS — I4819 Other persistent atrial fibrillation: Secondary | ICD-10-CM

## 2017-11-06 DIAGNOSIS — I2510 Atherosclerotic heart disease of native coronary artery without angina pectoris: Secondary | ICD-10-CM | POA: Diagnosis not present

## 2017-11-06 LAB — ECHOCARDIOGRAM COMPLETE
Height: 73 in
Weight: 3664 oz

## 2017-11-06 MED ORDER — PERFLUTREN LIPID MICROSPHERE
1.0000 mL | INTRAVENOUS | Status: AC | PRN
  Administered 2017-11-06: 3 mL via INTRAVENOUS

## 2017-11-06 NOTE — Progress Notes (Signed)
PCP: Lavone Orn, MD Primary Cardiologist: Dr End Primary EP: Dr Hector Clark is a 72 y.o. male who presents today for routine electrophysiology followup.  Since his CABG with MAZE and LAA clip, the patient reports doing very well. He is very pleased with results.  He has had no afib. Today, he denies symptoms of palpitations, chest pain, shortness of breath,  lower extremity edema, dizziness, presyncope, or syncope.  The patient is otherwise without complaint today.   Past Medical History:  Diagnosis Date  . Chest pain    with nagative cardiolite  . Gout    occational flare  . Hyperlipemia   . Persistent atrial fibrillation ([place])   . Peyronie disease   . 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 S/P Maze operation for atrial fibrillation 06/24/2017   Left atrial lesion set using bipolar radiofrequency and cryothermy ablation via conventional median sternotomy with clipping of LA appendage  . Visit for monitoring Tikosyn therapy 06/18/2017   Past Surgical History:  Procedure Laterality Date  . bone graph    . CARDIOVERSION N/A 03/25/2017   Procedure: CARDIOVERSION;  Surgeon: Sueanne Margarita, MD;  Location: [facility] ENDOSCOPY;  Service: Cardiovascular;  Laterality: N/A;  . CLIPPING OF ATRIAL APPENDAGE N/A 06/24/2017   Procedure: CLIPPING OF ATRIAL APPENDAGE;  Surgeon: Rexene Alberts, MD;  Location: [place];  Service: Open Heart Surgery;  Laterality: N/A;  . CORONARY ARTERY BYPASS GRAFT N/A 06/24/2017   Procedure: CORONARY ARTERY BYPASS GRAFTING (CABG) times two utilizing left anterior mammary artery and right greater saphenous vein harvested endoscopically;  Surgeon: Rexene Alberts, MD;  Location: [place];  Service: Open Heart Surgery;  Laterality: N/A;  . IABP INSERTION N/A 06/24/2017   Procedure: IABP Insertion;  Surgeon: Martinique, Peter M, MD;  Location: [place] CV LAB;  Service: Cardiovascular;  Laterality: N/A;  . LEFT HEART CATH AND  CORONARY ANGIOGRAPHY N/A 06/24/2017   Procedure: LEFT HEART CATH AND CORONARY ANGIOGRAPHY;  Surgeon: Martinique, Peter M, MD;  Location: [place] CV LAB;  Service: Cardiovascular;  Laterality: N/A;  . MAZE N/A 06/24/2017   Procedure: MAZE;  Surgeon: Rexene Alberts, MD;  Location: [place];  Service: Open Heart Surgery;  Laterality: N/A;  . TEE WITHOUT CARDIOVERSION N/A 06/24/2017   Procedure: TRANSESOPHAGEAL ECHOCARDIOGRAM (TEE);  Surgeon: Rexene Alberts, MD;  Location: [place];  Service: Open Heart Surgery;  Laterality: N/A;  . 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 acetaminophen (TYLENOL) 500 MG tablet Take 2 tablets (1,000 mg total) by mouth every 6 (six) hours as needed. 30 tablet 0  . Ascorbic Acid (VITAMIN C) 100 MG tablet Take 100 mg by mouth daily.    Marland Kitchen aspirin EC 81 MG EC tablet Take 1 tablet (81 mg total) by mouth daily.    Marland Kitchen atorvastatin (LIPITOR) 80 MG tablet TAKE 1 TABLET BY MOUTH ONCE DAILY AT  6  PM 90 tablet 1  . cholecalciferol (VITAMIN D) 1000 units tablet Take 1,000 Units by mouth daily.    Marland Kitchen dofetilide (TIKOSYN) 500 MCG capsule Take 1 capsule (500 mcg total) by mouth 2 (two) times daily. 60 capsule 11  . metoprolol succinate (TOPROL XL) 25 MG 24 hr tablet Take  1 tablet (25 mg total) by mouth daily. Ok to take an extra 1/2 tablet prn for afib 45 tablet 11  . rivaroxaban (XARELTO) 20 MG TABS tablet Take 1 tablet (20 mg total) by mouth daily with supper. 30 tablet 6  . thiamine (VITAMIN B-1) 100 MG tablet Take 100 mg by mouth daily.    . traMADol (ULTRAM) 50 MG tablet Take 1 tablet (50 mg total) by mouth every 4 (four) hours as needed for moderate pain. 30 tablet 0   No current facility-administered medications for this visit.     Physical Exam: Vitals:   11/06/17 1128  BP: 120/70  Pulse: 64  Weight: 229 lb (103.9 kg)  Height: 6\' 1"   (1.854 m)    GEN- The patient is well appearing, alert and oriented x 3 today.   Head- normocephalic, atraumatic Eyes-  Sclera clear, conjunctiva pink Ears- hearing intact Oropharynx- clear Lungs- Clear to ausculation bilaterally, normal work of breathing Heart- Regular rate and rhythm, no murmurs, rubs or gallops, PMI not laterally displaced GI- soft, NT, ND, + BS Extremities- no clubbing, cyanosis, or edema  Wt Readings from Last 3 Encounters:  11/06/17 229 lb (103.9 kg)  11/04/17 227 lb (103 kg)  09/16/17 226 lb (102.5 kg)    EKG tracing ordered 11/04/17 is personally reviewed and shows sinus rhythm 68 bpm, PR 184 msec, QRS 88 msec, Qtc 446 msec  Assessment and Plan:  1. Persistent afib Resolved post MAZE He wishes to stop tikosyn We will therefore stop tikosyn today I would advise that we keep him on xarelto.  If he has no afib off AAD, we could reconsider this option in the future.  There may be a role for Apple Watch or long term monitoring with a loop recorder to guide anticoagulation in the future.  2. CAD No ischemic symptoms Repeat echo today  Follow-up with Dr End as scheduled I will see again in 6 months  Thompson Grayer MD, [facility] 11/06/2017 12:13 PM

## 2017-11-06 NOTE — Patient Instructions (Addendum)
Medication Instructions:  Your physician has recommended you make the following change in your medication:  1.  Stop taking your dofetilide (Tikosyn)  Labwork: None ordered.  Testing/Procedures: None ordered.  Follow-Up: Your physician wants you to follow-up in: 6 months with Dr. Rayann Heman.    You will receive a reminder letter in the mail two months in advance. If you don't receive a letter, please call our office to schedule the follow-up appointment.  Any Other Special Instructions Will Be Listed Below (If Applicable).  If you need a refill on your cardiac medications before your next appointment, please call your pharmacy.

## 2017-11-08 ENCOUNTER — Encounter (HOSPITAL_COMMUNITY): Payer: Medicare Other

## 2017-11-11 ENCOUNTER — Encounter (HOSPITAL_COMMUNITY): Payer: Medicare Other

## 2017-11-11 DIAGNOSIS — H524 Presbyopia: Secondary | ICD-10-CM | POA: Diagnosis not present

## 2017-11-11 DIAGNOSIS — D4981 Neoplasm of unspecified behavior of retina and choroid: Secondary | ICD-10-CM | POA: Diagnosis not present

## 2017-11-11 DIAGNOSIS — H52223 Regular astigmatism, bilateral: Secondary | ICD-10-CM | POA: Diagnosis not present

## 2017-11-11 DIAGNOSIS — H2513 Age-related nuclear cataract, bilateral: Secondary | ICD-10-CM | POA: Diagnosis not present

## 2017-11-11 DIAGNOSIS — H5213 Myopia, bilateral: Secondary | ICD-10-CM | POA: Diagnosis not present

## 2017-11-13 ENCOUNTER — Encounter (HOSPITAL_COMMUNITY): Payer: Medicare Other

## 2017-11-14 DIAGNOSIS — D225 Melanocytic nevi of trunk: Secondary | ICD-10-CM | POA: Diagnosis not present

## 2017-11-14 DIAGNOSIS — D485 Neoplasm of uncertain behavior of skin: Secondary | ICD-10-CM | POA: Diagnosis not present

## 2017-11-14 DIAGNOSIS — L821 Other seborrheic keratosis: Secondary | ICD-10-CM | POA: Diagnosis not present

## 2017-11-14 DIAGNOSIS — D0461 Carcinoma in situ of skin of right upper limb, including shoulder: Secondary | ICD-10-CM | POA: Diagnosis not present

## 2017-11-14 DIAGNOSIS — D1801 Hemangioma of skin and subcutaneous tissue: Secondary | ICD-10-CM | POA: Diagnosis not present

## 2017-11-15 ENCOUNTER — Encounter (HOSPITAL_COMMUNITY): Payer: Medicare Other

## 2017-11-18 ENCOUNTER — Encounter (HOSPITAL_COMMUNITY): Payer: Medicare Other

## 2017-11-20 ENCOUNTER — Encounter (HOSPITAL_COMMUNITY): Payer: Medicare Other

## 2017-11-22 ENCOUNTER — Encounter (HOSPITAL_COMMUNITY): Payer: Medicare Other

## 2017-11-25 ENCOUNTER — Encounter (HOSPITAL_COMMUNITY): Payer: Medicare Other

## 2017-11-27 ENCOUNTER — Encounter (HOSPITAL_COMMUNITY): Payer: Medicare Other

## 2017-11-29 ENCOUNTER — Encounter (HOSPITAL_COMMUNITY): Payer: Medicare Other

## 2017-12-02 ENCOUNTER — Encounter (HOSPITAL_COMMUNITY): Payer: Medicare Other

## 2017-12-04 ENCOUNTER — Encounter (HOSPITAL_COMMUNITY): Payer: Medicare Other

## 2017-12-12 DIAGNOSIS — D0461 Carcinoma in situ of skin of right upper limb, including shoulder: Secondary | ICD-10-CM | POA: Diagnosis not present

## 2017-12-14 ENCOUNTER — Other Ambulatory Visit: Payer: Self-pay | Admitting: Physician Assistant

## 2017-12-16 NOTE — Telephone Encounter (Signed)
Pt is a 72 yr old male, who saw Dr. Rayann Heman on 11/06/17. Weight at that visit was 103.9Kg. Last noted SCr was 0.88 on 06/28/17. CrCl is 158mL/min. Will refill Xarelto 20mg  QD.

## 2017-12-25 ENCOUNTER — Encounter: Payer: Self-pay | Admitting: Internal Medicine

## 2018-03-10 ENCOUNTER — Encounter: Payer: Self-pay | Admitting: Internal Medicine

## 2018-03-10 ENCOUNTER — Ambulatory Visit (INDEPENDENT_AMBULATORY_CARE_PROVIDER_SITE_OTHER): Payer: Medicare Other | Admitting: Internal Medicine

## 2018-03-10 VITALS — BP 110/78 | HR 69 | Ht 73.0 in | Wt 229.8 lb

## 2018-03-10 DIAGNOSIS — E785 Hyperlipidemia, unspecified: Secondary | ICD-10-CM

## 2018-03-10 DIAGNOSIS — I2510 Atherosclerotic heart disease of native coronary artery without angina pectoris: Secondary | ICD-10-CM

## 2018-03-10 DIAGNOSIS — R252 Cramp and spasm: Secondary | ICD-10-CM | POA: Diagnosis not present

## 2018-03-10 DIAGNOSIS — I480 Paroxysmal atrial fibrillation: Secondary | ICD-10-CM | POA: Diagnosis not present

## 2018-03-10 NOTE — Progress Notes (Signed)
Follow-up Outpatient Visit Date: 03/10/2018  Primary Care Provider: Lavone Orn, MD [place] [address] [place] [address]  Chief Complaint: Follow-up coronary artery disease and atrial fibrillation  HPI:  Hector Clark is a 72 y.o. year-old male with history of coronary artery disease status post emergent CABG in 06/2017, persistent atrial fibrillation status post Maze procedure, and hyperlipidemia, who presents for follow-up of CAD and a-fib.  I last saw Hector Clark in June, at which time he was feeling very well.  His only complaint was of mild soreness around his sternotomy incision.  He was seen by Dr. Rayann Heman after our visit in June, as which time dofetilide was stopped.  He was advised to remain on rivaroxaban.  Today, Hector Clark is most concerned about increasing cramps and joint pains in his legs, which he believes are due to atorvastatin.  These are also keeping him up at night, making him more fatigued during the day.  He has not had any chest pain, shortness of breath, palpitations, or lightheadedness.  Due to his fatigue and leg pain, he is not able to exercise much as he would like.  He has occasionally skipped doses of atorvastatin and feels like his leg discomfort was better with this.  He also inquires today about discontinuing rivaroxaban.  --------------------------------------------------------------------------------------------------  Cardiovascular History & Procedures: Cardiovascular Problems:  Coronary artery disease (NSTEMI) s/p emergent CABG (06/2017)  Atrial fibrillation s/p Maze procedure (06/2017)  Risk Factors:  Known CAD, male gender, hyperlipidemia, and age greater than 74  Cath/PCI:  LHC (24/19): LMCA with 99% stenosis.  LAD normal. LCx with 99% ostial/proximal lesion.  RCA with mild proximal and midvessel disease.  LVEF 35-45% with mid/apical anterior and apical inferior hypokinesis..  CV Surgery:  CABG/Maze procedure  (06/24/17, Dr. Roxy Manns): LIMA-LAD and SVG-OM  EP Procedures and Devices:  DCCV (03/25/17): Successful cardioversion to NSR  24-hour Holter monitor (01/22/17): Atrial fibrillation with episodes of PVCs and nonsustained ventricular tachycardia versus aberrancy.  Non-Invasive Evaluation(s):  Right groin ultrasound (08/26/17): No pseudoaneurysm, AV fistula, or DVT.  TEE (06/24/17): Normal LV size.  LVEF 35-40%.  Trace AI.  Trace MR.  Normal RV size and function.  Trace TR.  Trace PR.  Pharmacologic MPI (01/04/17): Low risk study with apical thinning versus small infarct. No ischemia. Study not gated due to atrial fibrillation.  TTE (12/20/16): Normal LV size and wall thickness. LVEF 50-55%. Unable to assess diastolic function. Mild mitral regurgitation. Normal RV size and function.  Myocardial perfusion stress test approximately 10 years ago: Normal per patient report.  Recent CV Pertinent Labs: Lab Results  Component Value Date   CHOL 110 08/20/2017   HDL 44 08/20/2017   LDLCALC 50 08/20/2017   TRIG 82 08/20/2017   CHOLHDL 2.5 08/20/2017   INR 1.32 06/24/2017   K 4.5 06/28/2017   MG 2.3 06/25/2017   BUN 23 (H) 06/28/2017   BUN 13 03/20/2017   CREATININE 0.88 06/28/2017    Past medical and surgical history were reviewed and updated in EPIC.  Current Meds  Medication Sig  . acetaminophen (TYLENOL) 500 MG tablet Take 2 tablets (1,000 mg total) by mouth every 6 (six) hours as needed.  . Ascorbic Acid (VITAMIN C) 100 MG tablet Take 100 mg by mouth daily.  Marland Kitchen aspirin EC 81 MG EC tablet Take 1 tablet (81 mg total) by mouth daily.  . cholecalciferol (VITAMIN D) 1000 units tablet Take 1,000 Units by mouth daily.  . metoprolol succinate (TOPROL XL)  25 MG 24 hr tablet Take 1 tablet (25 mg total) by mouth daily. Ok to take an extra 1/2 tablet prn for afib  . thiamine (VITAMIN B-1) 100 MG tablet Take 100 mg by mouth daily.  Alveda Reasons 20 MG TABS tablet TAKE 1 TABLET BY MOUTH ONCE DAILY WITH SUPPER  .  [DISCONTINUED] atorvastatin (LIPITOR) 80 MG tablet TAKE 1 TABLET BY MOUTH ONCE DAILY AT  6  PM    Allergies: Patient has no known allergies.  Social History   Tobacco Use  . Smoking status: Never Smoker  . Smokeless tobacco: Never Used  Substance Use Topics  . Alcohol use: Yes    Comment: 1 beer per month  . Drug use: No    Family History  Problem Relation Age of Onset  . Liver cancer Mother 16  . Esophageal cancer Father 27  . Healthy Daughter   . Heart disease Neg Hx     Review of Systems: A 12-system review of systems was performed and was negative except as noted in the HPI.  --------------------------------------------------------------------------------------------------  Physical Exam: BP 110/78   Pulse 69   Ht 6\' 1"  (1.854 m)   Wt 229 lb 12.8 oz (104.2 kg)   BMI 30.32 kg/m   General: NAD. HEENT: No conjunctival pallor or scleral icterus. Clark mucous membranes.  OP clear. Neck: Supple without lymphadenopathy, thyromegaly, JVD, or HJR. Lungs: Normal work of breathing. Clear to auscultation bilaterally without wheezes or crackles. Heart: Regular rate and rhythm without murmurs, rubs, or gallops. Non-displaced PMI. Abd: Bowel sounds present. Soft, NT/ND without hepatosplenomegaly Ext: No lower extremity edema. Skin: Warm and dry without rash.  EKG: Normal sinus rhythm without abnormality.  Lab Results  Component Value Date   WBC 14.3 (H) 06/28/2017   HGB 11.2 (L) 06/28/2017   HCT 35.0 (L) 06/28/2017   MCV 93.8 06/28/2017   PLT 142 (L) 06/28/2017    Lab Results  Component Value Date   NA 135 06/28/2017   K 4.5 06/28/2017   CL 99 (L) 06/28/2017   CO2 25 06/28/2017   BUN 23 (H) 06/28/2017   CREATININE 0.88 06/28/2017   GLUCOSE 136 (H) 06/28/2017   ALT 21 08/20/2017    Lab Results  Component Value Date   CHOL 110 08/20/2017   HDL 44 08/20/2017   LDLCALC 50 08/20/2017   TRIG 82 08/20/2017   CHOLHDL 2.5 08/20/2017     --------------------------------------------------------------------------------------------------  ASSESSMENT AND PLAN: Coronary artery disease without angina No symptoms of worsening coronary insufficiency status post emergent CABG this past winter.  Continue aggressive secondary prevention albeit with statin holiday, as outlined below.  Paroxysmal atrial fibrillation No symptoms to suggest recurrence following Maze procedure.  Patient is now off dofetilide but remains on rivaroxaban.  He inquires about stopping rivaroxaban, though I will defer this to Dr. Rayann Heman.  Hyperlipidemia and leg cramps LDL well controlled on most recent check in April on atorvastatin 80 mg daily.  In light of myalgias and leg cramps, we have agreed to a 2-week statin holiday.  If his symptoms resolve, we will plan to restart atorvastatin at a lower dose.  I will check a BMP, magnesium level, and CK today.  Follow-up: Given my transition to [facility], Hector Clark wishes to follow-up with [name] in 6 months.  Nelva Bush, MD 03/10/2018 12:18 PM

## 2018-03-10 NOTE — Patient Instructions (Signed)
Medication Instructions:  STOP Lipitor  If you need a refill on your cardiac medications before your next appointment, please call your pharmacy.   Lab work: TODAY BMET MAGNESIUM TOTAL CK If you have labs (blood work) drawn today and your tests are completely normal, you will receive your results only by: Marland Kitchen MyChart Message (if you have MyChart) OR . A paper copy in the mail If you have any lab test that is abnormal or we need to change your treatment, we will call you to review the results.  Testing/Procedures: NONE  Follow-Up: At [facility], you and your health needs are our priority.  As part of our continuing mission to provide you with exceptional heart care, we have created designated Provider Care Teams.  These Care Teams include your primary Cardiologist (physician) and Advanced Practice Providers (APPs -  Physician Assistants and Nurse Practitioners) who all work together to provide you with the care you need, when you need it. You will need a follow up appointment in 6 months.  Please call our office 2 months in advance to schedule this appointment.  You may see Dr Martinique or one of the following Advanced Practice Providers on your designated Care Team: Almyra Deforest, [place] . Fabian Sharp, PA-C  Any Other Special Instructions Will Be Listed Below (If Applicable).

## 2018-03-11 LAB — BASIC METABOLIC PANEL
BUN/Creatinine Ratio: 10 (ref 10–24)
BUN: 11 mg/dL (ref 8–27)
CO2: 24 mmol/L (ref 20–29)
CREATININE: 1.06 mg/dL (ref 0.76–1.27)
Calcium: 9.6 mg/dL (ref 8.6–10.2)
Chloride: 101 mmol/L (ref 96–106)
GFR calc Af Amer: 82 mL/min/{1.73_m2} (ref >59)
GFR calc non Af Amer: 71 mL/min/{1.73_m2} (ref >59)
Glucose: 145 mg/dL — ABNORMAL HIGH (ref 65–99)
Potassium: 4.9 mmol/L (ref 3.5–5.2)
SODIUM: 140 mmol/L (ref 134–144)

## 2018-03-11 LAB — CK TOTAL AND CKMB (NOT AT ARMC)
CK TOTAL: 156 U/L (ref 24–204)
CK-MB Index: 4.9 ng/mL (ref 0.0–10.4)

## 2018-03-11 LAB — MAGNESIUM: MAGNESIUM: 2 mg/dL (ref 1.6–2.3)

## 2018-04-02 DIAGNOSIS — L821 Other seborrheic keratosis: Secondary | ICD-10-CM | POA: Diagnosis not present

## 2018-04-02 DIAGNOSIS — Z85828 Personal history of other malignant neoplasm of skin: Secondary | ICD-10-CM | POA: Diagnosis not present

## 2018-05-05 ENCOUNTER — Encounter: Payer: Self-pay | Admitting: Internal Medicine

## 2018-05-05 ENCOUNTER — Ambulatory Visit (INDEPENDENT_AMBULATORY_CARE_PROVIDER_SITE_OTHER): Payer: Medicare Other | Admitting: Internal Medicine

## 2018-05-05 VITALS — BP 112/72 | HR 80 | Ht 73.5 in | Wt 229.0 lb

## 2018-05-05 DIAGNOSIS — I2510 Atherosclerotic heart disease of native coronary artery without angina pectoris: Secondary | ICD-10-CM

## 2018-05-05 DIAGNOSIS — I4819 Other persistent atrial fibrillation: Secondary | ICD-10-CM | POA: Diagnosis not present

## 2018-05-05 DIAGNOSIS — I255 Ischemic cardiomyopathy: Secondary | ICD-10-CM

## 2018-05-05 NOTE — Patient Instructions (Addendum)
Medication Instructions:  Your physician recommends that you continue on your current medications as directed. Please refer to the Current Medication list given to you today.  Labwork: None ordered.  Testing/Procedures: None ordered.  Follow-Up: Your physician wants you to follow-up in: as needed with Dr. Allred.       Any Other Special Instructions Will Be Listed Below (If Applicable).  If you need a refill on your cardiac medications before your next appointment, please call your pharmacy.   

## 2018-05-05 NOTE — Progress Notes (Signed)
PCP: Lavone Orn, MD Primary Cardiologist: Dr End Primary EP: Dr Micah Flesher is a 72 y.o. male who presents today for routine electrophysiology followup.  Since last being seen in our clinic, the patient reports doing very well.  Today, he denies symptoms of palpitations, chest pain, shortness of breath,  lower extremity edema, dizziness, presyncope, or syncope.  The patient is otherwise without complaint today.   Past Medical History:  Diagnosis Date  . Chest pain    with nagative cardiolite  . Gout    occational flare  . Hyperlipemia   . Persistent atrial fibrillation   . Peyronie disease   . 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 S/P Maze operation for atrial fibrillation 06/24/2017   Left atrial lesion set using bipolar radiofrequency and cryothermy ablation via conventional median sternotomy with clipping of LA appendage  . Visit for monitoring Tikosyn therapy 06/18/2017   Past Surgical History:  Procedure Laterality Date  . bone graph    . CARDIOVERSION N/A 03/25/2017   Procedure: CARDIOVERSION;  Surgeon: Sueanne Margarita, MD;  Location: [facility] ENDOSCOPY;  Service: Cardiovascular;  Laterality: N/A;  . CLIPPING OF ATRIAL APPENDAGE N/A 06/24/2017   Procedure: CLIPPING OF ATRIAL APPENDAGE;  Surgeon: Rexene Alberts, MD;  Location: [place];  Service: Open Heart Surgery;  Laterality: N/A;  . CORONARY ARTERY BYPASS GRAFT N/A 06/24/2017   Procedure: CORONARY ARTERY BYPASS GRAFTING (CABG) times two utilizing left anterior mammary artery and right greater saphenous vein harvested endoscopically;  Surgeon: Rexene Alberts, MD;  Location: [place];  Service: Open Heart Surgery;  Laterality: N/A;  . IABP INSERTION N/A 06/24/2017   Procedure: IABP Insertion;  Surgeon: Martinique, Peter M, MD;  Location: [place] CV LAB;  Service: Cardiovascular;  Laterality: N/A;  . LEFT HEART CATH AND CORONARY ANGIOGRAPHY N/A 06/24/2017   Procedure: LEFT HEART CATH  AND CORONARY ANGIOGRAPHY;  Surgeon: Martinique, Peter M, MD;  Location: [place] CV LAB;  Service: Cardiovascular;  Laterality: N/A;  . MAZE N/A 06/24/2017   Procedure: MAZE;  Surgeon: Rexene Alberts, MD;  Location: [place];  Service: Open Heart Surgery;  Laterality: N/A;  . TEE WITHOUT CARDIOVERSION N/A 06/24/2017   Procedure: TRANSESOPHAGEAL ECHOCARDIOGRAM (TEE);  Surgeon: Rexene Alberts, MD;  Location: [place];  Service: Open Heart Surgery;  Laterality: N/A;  . 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 aspirin EC 81 MG EC tablet Take 1 tablet (81 mg total) by mouth daily.     No current facility-administered medications for this visit.     Physical Exam: Vitals:   05/05/18 1148  BP: 112/72  Pulse: 80  SpO2: 99%  Weight: 229 lb (103.9 kg)  Height: 6' 1.5" (1.867 m)    GEN- The patient is well appearing, alert and oriented x 3 today.   Head- normocephalic, atraumatic Eyes-  Sclera clear, conjunctiva pink Ears- hearing intact Oropharynx- clear Lungs- Clear to ausculation bilaterally, normal work of breathing Heart- Regular rate and rhythm, no murmurs, rubs or gallops, PMI not laterally displaced GI- soft, NT, ND, + BS Extremities- no clubbing, cyanosis, or edema  Wt Readings from Last 3 Encounters:  05/05/18 229 lb (103.9 kg)  03/10/18 229 lb 12.8 oz (  104.2 kg)  11/06/17 229 lb (103.9 kg)    EKG tracing ordered today is personally reviewed and shows sinus rhythm 80 bpm, PR 190 msec, QRS 98 msec, QTc 424 msec  Assessment and Plan:  1. Persistent afib Resolved post MAZE Maintaining sinus off tikosyn chads2vasc score is 2.  He is s/p LAA clip He decided on his own to stop xarelto  2. CAD No ischemic symptoms He has stopped all of his medicines including metoprolol and statin on his own.  I have advised the importance of compliance  with medicines long term.  He prefer to take no medicines and try lifestyle modification.  I have encouraged further discussion with Dr Martinique on follow-up.  Follow-up with me as needed He is going to follow with Dr Martinique going forward  Thompson Grayer MD, [facility] 05/05/2018 12:19 PM

## 2018-05-12 DIAGNOSIS — E7800 Pure hypercholesterolemia, unspecified: Secondary | ICD-10-CM | POA: Diagnosis not present

## 2018-05-12 DIAGNOSIS — I2510 Atherosclerotic heart disease of native coronary artery without angina pectoris: Secondary | ICD-10-CM | POA: Diagnosis not present

## 2018-05-12 DIAGNOSIS — Z1389 Encounter for screening for other disorder: Secondary | ICD-10-CM | POA: Diagnosis not present

## 2018-05-12 DIAGNOSIS — Z6830 Body mass index (BMI) 30.0-30.9, adult: Secondary | ICD-10-CM | POA: Diagnosis not present

## 2018-05-12 DIAGNOSIS — E669 Obesity, unspecified: Secondary | ICD-10-CM | POA: Diagnosis not present

## 2018-05-12 DIAGNOSIS — R7301 Impaired fasting glucose: Secondary | ICD-10-CM | POA: Diagnosis not present

## 2018-05-12 DIAGNOSIS — Z0000 Encounter for general adult medical examination without abnormal findings: Secondary | ICD-10-CM | POA: Diagnosis not present

## 2018-05-12 DIAGNOSIS — I480 Paroxysmal atrial fibrillation: Secondary | ICD-10-CM | POA: Diagnosis not present

## 2018-06-09 ENCOUNTER — Encounter: Payer: Self-pay | Admitting: Thoracic Surgery (Cardiothoracic Vascular Surgery)

## 2018-06-09 ENCOUNTER — Ambulatory Visit (INDEPENDENT_AMBULATORY_CARE_PROVIDER_SITE_OTHER): Payer: Medicare Other | Admitting: Thoracic Surgery (Cardiothoracic Vascular Surgery)

## 2018-06-09 ENCOUNTER — Other Ambulatory Visit: Payer: Self-pay

## 2018-06-09 VITALS — BP 127/75 | HR 76 | Resp 16 | Ht 73.5 in | Wt 223.4 lb

## 2018-06-09 DIAGNOSIS — Z8679 Personal history of other diseases of the circulatory system: Secondary | ICD-10-CM | POA: Diagnosis not present

## 2018-06-09 DIAGNOSIS — Z9889 Other specified postprocedural states: Secondary | ICD-10-CM

## 2018-06-09 DIAGNOSIS — Z951 Presence of aortocoronary bypass graft: Secondary | ICD-10-CM | POA: Diagnosis not present

## 2018-06-09 NOTE — Progress Notes (Signed)
[address]       [place] [address]             [phone]     CARDIOTHORACIC SURGERY OFFICE NOTE  Referring Provider is Martinique, Peter M, MD  Primary Cardiologist is End, Harrell Gave, MD PCP is Lavone Orn, MD   HPI:  Patient is a 73 year old male with history of atrial fibrillation and hyperlipidemia who underwent emergent coronary artery bypass grafting x2 and Maze procedure on June 24, 2017 for critical left main coronary artery disease status post acute non-ST segment elevation myocardial infarction with recurrent persistent atrial fibrillation.  He was last seen here in our office on September 16, 2017 at which time he was doing well and maintaining sinus rhythm.  Since then he has been followed carefully by Dr. Saunders Revel and Dr. Rayann Heman.  Follow-up echocardiogram performed November 06, 2017 revealed preserved left ventricular systolic function with ejection fraction estimated 50 to 55%.  He has continued to maintain sinus rhythm.  Last fall he was having some problems with muscle aches and his high-dose Lipitor was stopped.  Metoprolol and Xarelto were also stopped at that time.  Since then he has been taking only aspirin daily.  He was seen in follow-up by Dr. Rayann Heman last month and continue to maintain sinus rhythm.  He returns to our office today and reports that he feels quite well.  He has no symptoms of exertional chest pain or shortness of breath.  He is now exercising on on a regular basis.  He has not had his lipid profile checked since he went off of statin therapy.  Overall he reports that he feels much better than he has felt in several years.  His only limitation is that of some mild joint pain in his knee and right thumb.   Current Outpatient Medications  Medication Sig Dispense Refill  . aspirin EC 81 MG EC tablet Take 1 tablet (81 mg total) by mouth daily.     No current facility-administered medications for this visit.       Physical Exam:   BP  127/75 (BP Location: Right Arm, Patient Position: Sitting, Cuff Size: Large)   Pulse 76   Resp 16   Ht 6' 1.5" (1.867 m)   Wt 223 lb 6.4 oz (101.3 kg)   SpO2 97% Comment: ON RA  BMI 29.07 kg/m   General:  Well-appearing  Chest:   Clear to auscultation  CV:   Regular rate and rhythm without murmur  Incisions:  Well-healed  Abdomen:  Soft nontender  Extremities:  Warm and well-perfused  Diagnostic Tests:  2 channel telemetry rhythm strip demonstrates normal sinus rhythm    Transthoracic Echocardiography  Patient:    Truman, Aceituno MR #:       [id number] Study Date: 11/06/2017 Gender:     M Age:        72 Height:     185.4 cm Weight:     103 kg BSA:        2.33 m^2 Pt. Status: Room:   SONOGRAPHER  Diamond Nickel  ORDERING     Nelva Bush, MD  REFERRING    Nelva Bush, MD  ATTENDING    Candee Furbish, M.D.  PERFORMING   [facility], Outpatient  cc:  ------------------------------------------------------------------- LV EF: 50% -   55%  ------------------------------------------------------------------- Indications:      I25.10 CAD. I25.5 Ischemic cardiomyopathy.  ------------------------------------------------------------------- Study Conclusions  - Left ventricle: The cavity size was  normal. Wall thickness was   increased in a pattern of mild LVH. Systolic function was normal.   The estimated ejection fraction was in the range of 50% to 55%.   Diffuse hypokinesis. There was no evidence of elevated   ventricular filling pressure by Doppler parameters. - Aortic valve: Trileaflet; mildly thickened, mildly calcified   leaflets.  ------------------------------------------------------------------- Study data:  Comparison was made to the study of 06/24/2017.  Study status:  Routine.  Procedure:  The patient reported no pain pre or post test. Transthoracic echocardiography. Image quality was adequate. The study was technically difficult, as a result of  poor sound wave transmission. Intravenous contrast (Definity) was administered.  Study completion:  There were no complications.     Transthoracic echocardiography.  M-mode, complete 2D, spectral Doppler, and color Doppler.  Birthdate:  Patient birthdate: [birth year].  Age:  Patient is 73 yr old.  Sex:  Gender: male. BMI: 30 kg/m^2.  Blood pressure:     120/70  Patient status: Outpatient.  Study date:  Study date: 11/06/2017. Study time: 01:55 PM.  Location:   [facility]  -------------------------------------------------------------------  ------------------------------------------------------------------- Left ventricle:  The cavity size was normal. Wall thickness was increased in a pattern of mild LVH. Systolic function was normal. The estimated ejection fraction was in the range of 50% to 55%. Diffuse hypokinesis. There was no evidence of elevated ventricular filling pressure by Doppler parameters.  ------------------------------------------------------------------- Aortic valve:   Trileaflet; mildly thickened, mildly calcified leaflets. Mobility was not restricted.  Doppler:  Transvalvular velocity was within the normal range. There was no stenosis. There was no regurgitation.  ------------------------------------------------------------------- Aorta:  Aortic root: The aortic root was normal in size.  ------------------------------------------------------------------- Mitral valve:   Structurally normal valve.   Mobility was not restricted.  Doppler:  Transvalvular velocity was within the normal range. There was no evidence for stenosis. There was no regurgitation.  ------------------------------------------------------------------- Left atrium:  The atrium was normal in size.  ------------------------------------------------------------------- Right ventricle:  The cavity size was normal. Wall thickness was normal. Systolic function was  normal.  ------------------------------------------------------------------- Pulmonic valve:    Doppler:  Transvalvular velocity was within the normal range. There was no evidence for stenosis.  ------------------------------------------------------------------- Tricuspid valve:   Structurally normal valve.    Doppler: Transvalvular velocity was within the normal range. There was no regurgitation.  ------------------------------------------------------------------- Pulmonary artery:   The main pulmonary artery was normal-sized. Systolic pressure was within the normal range.  ------------------------------------------------------------------- Right atrium:  The atrium was normal in size.  ------------------------------------------------------------------- Pericardium:  There was no pericardial effusion.  ------------------------------------------------------------------- Systemic veins: Inferior vena cava: The vessel was normal in size.  ------------------------------------------------------------------- Measurements   Left ventricle                         Value        Reference  LV ID, ED, PLAX chordal        (L)     38.3  mm     43 - 52  LV ID, ES, PLAX chordal                27.8  mm     23 - 38  LV fx shortening, PLAX chordal (L)     27    %      >=29  LV PW thickness, ED                    13.8  mm     ----------  IVS/LV PW ratio, ED                    0.89         <=1.3  LV e&', lateral                         11.1  cm/s   ----------  LV E/e&', lateral                       5.5          ----------  LV e&', medial                          5.33  cm/s   ----------  LV E/e&', medial                        11.46        ----------  LV e&', average                         8.22  cm/s   ----------  LV E/e&', average                       7.44         ----------    Ventricular septum                     Value        Reference  IVS thickness, ED                      12.3   mm     ----------    LVOT                                   Value        Reference  LVOT ID, S                             21    mm     ----------  LVOT area                              3.46  cm^2   ----------    Aorta                                  Value        Reference  Aortic root ID, ED                     35    mm     ----------    Left atrium                            Value        Reference  LA ID, A-P, ES                         29    mm     ----------  LA ID/bsa, A-P                         1.25  cm/m^2 <=2.2  LA volume, S                           73.1  ml     ----------  LA volume/bsa, S                       31.4  ml/m^2 ----------  LA volume, ES, 1-p A4C                 66.1  ml     ----------  LA volume/bsa, ES, 1-p A4C             28.4  ml/m^2 ----------  LA volume, ES, 1-p A2C                 77.4  ml     ----------  LA volume/bsa, ES, 1-p A2C             33.3  ml/m^2 ----------    Mitral valve                           Value        Reference  Mitral E-wave peak velocity            61.1  cm/s   ----------  Mitral A-wave peak velocity            31    cm/s   ----------  Mitral deceleration time       (H)     310   ms     150 - 230  Mitral E/A ratio, peak                 2            ----------    Tricuspid valve                        Value        Reference  Tricuspid peak RV-RA gradient          16    mm Hg  ----------    Right atrium                           Value        Reference  RA ID, S-I, ES, A4C            (H)     54.5  mm     34 - 49  RA area, ES, A4C                       16.5  cm^2   8.3 - 19.5  RA volume, ES, A/L                     40.4  ml     ----------  RA volume/bsa, ES, A/L                 17.4  ml/m^2 ----------    Right ventricle  Value        Reference  RV s&', lateral, S                      8.7   cm/s   ----------  Legend: (L)  and  (H)  mark values outside specified reference  range.  ------------------------------------------------------------------- Prepared and Electronically Authenticated by  Candee Furbish, M.D. 2019-06-19T15:17:21   Impression:  Patient is doing very well and maintaining sinus rhythm approximately 1 year following emergency coronary artery bypass grafting and Maze procedure  Plan:  We have not recommended any change to the patient's current medications.  I have continued to emphasize the many benefits of regular exercise and a heart healthy diet.  We discussed the potential benefits of close attention to management of hyperlipidemia.  Decisions about whether or not he will try another statin will be made by Dr. Laurann Montana and/or [name].  All of his questions have been addressed.  In the future the patient will call and return to see [place] only should specific problems or questions arise.   I spent in excess of 15 minutes during the conduct of this office consultation and >50% of this time involved direct face-to-face encounter with the patient for counseling and/or coordination of their care.    Valentina Gu. Roxy Manns, MD 06/09/2018 1:44 PM

## 2018-06-09 NOTE — Patient Instructions (Signed)
Continue all previous medications without any changes at this time  Make every effort to maintain a "heart-healthy" lifestyle with regular physical exercise and adherence to a low-fat, low-carbohydrate diet.  Continue to seek regular follow-up appointments with your primary care physician and/or cardiologist.

## 2018-06-16 ENCOUNTER — Encounter: Payer: Medicare Other | Admitting: Thoracic Surgery (Cardiothoracic Vascular Surgery)

## 2018-09-13 ENCOUNTER — Encounter: Payer: Self-pay | Admitting: Thoracic Surgery (Cardiothoracic Vascular Surgery)

## 2018-09-22 ENCOUNTER — Emergency Department (HOSPITAL_COMMUNITY)
Admission: EM | Admit: 2018-09-22 | Discharge: 2018-09-22 | Disposition: A | Discharge status: Home / Self Care | Payer: Medicare Other | Attending: Emergency Medicine | Admitting: Emergency Medicine

## 2018-09-22 ENCOUNTER — Encounter (HOSPITAL_COMMUNITY): Payer: Self-pay | Admitting: Emergency Medicine

## 2018-09-22 ENCOUNTER — Emergency Department (HOSPITAL_COMMUNITY): Payer: Medicare Other

## 2018-09-22 ENCOUNTER — Other Ambulatory Visit: Payer: Self-pay

## 2018-09-22 DIAGNOSIS — Z955 Presence of coronary angioplasty implant and graft: Secondary | ICD-10-CM | POA: Insufficient documentation

## 2018-09-22 DIAGNOSIS — R0602 Shortness of breath: Secondary | ICD-10-CM | POA: Diagnosis not present

## 2018-09-22 DIAGNOSIS — R0789 Other chest pain: Secondary | ICD-10-CM | POA: Insufficient documentation

## 2018-09-22 DIAGNOSIS — M549 Dorsalgia, unspecified: Secondary | ICD-10-CM

## 2018-09-22 DIAGNOSIS — M546 Pain in thoracic spine: Secondary | ICD-10-CM | POA: Insufficient documentation

## 2018-09-22 DIAGNOSIS — I2510 Atherosclerotic heart disease of native coronary artery without angina pectoris: Secondary | ICD-10-CM | POA: Insufficient documentation

## 2018-09-22 DIAGNOSIS — I10 Essential (primary) hypertension: Secondary | ICD-10-CM | POA: Diagnosis not present

## 2018-09-22 DIAGNOSIS — I701 Atherosclerosis of renal artery: Secondary | ICD-10-CM | POA: Diagnosis not present

## 2018-09-22 DIAGNOSIS — I700 Atherosclerosis of aorta: Secondary | ICD-10-CM | POA: Diagnosis not present

## 2018-09-22 LAB — CBC
HCT: 47.1 % (ref 39.0–52.0)
Hemoglobin: 15.3 g/dL (ref 13.0–17.0)
MCH: 29.5 pg (ref 26.0–34.0)
MCHC: 32.5 g/dL (ref 30.0–36.0)
MCV: 90.9 fL (ref 80.0–100.0)
Platelets: 234 10*3/uL (ref 150–400)
RBC: 5.18 MIL/uL (ref 4.22–5.81)
RDW: 12.8 % (ref 11.5–15.5)
WBC: 8 10*3/uL (ref 4.0–10.5)
nRBC: 0 % (ref 0.0–0.2)

## 2018-09-22 LAB — BASIC METABOLIC PANEL
Anion gap: 11 (ref 5–15)
BUN: 13 mg/dL (ref 8–23)
CO2: 23 mmol/L (ref 22–32)
Calcium: 9.2 mg/dL (ref 8.9–10.3)
Chloride: 106 mmol/L (ref 98–111)
Creatinine, Ser: 1.16 mg/dL (ref 0.61–1.24)
GFR calc Af Amer: >60 mL/min (ref >60)
GFR calc non Af Amer: >60 mL/min (ref >60)
Glucose, Bld: 139 mg/dL — ABNORMAL HIGH (ref 70–99)
Potassium: 4.7 mmol/L (ref 3.5–5.1)
Sodium: 140 mmol/L (ref 135–145)

## 2018-09-22 LAB — TROPONIN I
Troponin I: <0.03 ng/mL (ref <0.03)
Troponin I: <0.03 ng/mL (ref <0.03)

## 2018-09-22 MED ORDER — IOHEXOL 350 MG/ML SOLN
100.0000 mL | Freq: Once | INTRAVENOUS | Status: AC
  Administered 2018-09-22: 08:00:00 100 mL via INTRAVENOUS

## 2018-09-22 MED ORDER — SODIUM CHLORIDE 0.9% FLUSH
3.0000 mL | Freq: Once | INTRAVENOUS | Status: AC
  Administered 2018-09-22: 3 mL via INTRAVENOUS

## 2018-09-22 NOTE — ED Notes (Signed)
Patient transported to X-ray 

## 2018-09-22 NOTE — ED Notes (Signed)
Nurse navigator spoke with patient resting comfortably and is communicating with family himself with his own cell phone.

## 2018-09-22 NOTE — ED Provider Notes (Signed)
Emergency Department Provider Note   I have reviewed the triage vital signs and the nursing notes.   HISTORY  Chief Complaint Back Pain   HPI Hector Clark is a 73 y.o. male with a history of coronary artery disease who presents the emergency department  secondary to upper back pain.  Patient and his wife supplied history of sounds that the patient oftentimes will get cramps in his legs and his back he woke up tonight with little bit of a cramp in his back but quickly got much worse.  Patient started having significant upper back pressure and pain that was worse than any cramping he never had in the past.  He became diaphoretic, ill, nauseous and his wife stated he did not look well.  EMS was called secondary to his history of heart disease and the proximity to his heart.  Prior to the arrival the symptoms resolved and patient is back at baseline at this time.  He had no lightheadedness or shortness of breath associated with this she is had no recent fever, cough, sick contacts or other illnesses.  No other associated symptoms.  No diarrhea or constipation.  No rashes.  No trauma.  No other associated or modifying symptoms.    Past Medical History:  Diagnosis Date  . Chest pain    with nagative cardiolite  . Gout    occational flare  . Hyperlipemia   . Persistent atrial fibrillation   . Peyronie disease   . 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 S/P Maze operation for atrial fibrillation 06/24/2017   Left atrial lesion set using bipolar radiofrequency and cryothermy ablation via conventional median sternotomy with clipping of LA appendage  . Visit for monitoring Tikosyn therapy 06/18/2017    Patient Active Problem List   Diagnosis Date Noted  . Paroxysmal atrial fibrillation ([place]) 03/10/2018  . Leg cramps 03/10/2018  . Ischemic cardiomyopathy 11/04/2017  . Coronary artery disease involving native coronary artery of native heart  without angina pectoris 11/04/2017  . Essential hypertension 11/04/2017  . Hyperlipidemia LDL goal <70 11/04/2017  . Non-ST elevation (NSTEMI) myocardial infarction ([place]) 06/24/2017  . S/P emergency CABG x 2  06/24/2017  . S/P Maze operation for atrial fibrillation 06/24/2017  . Atypical chest pain 12/15/2016  . Persistent atrial fibrillation 12/15/2016    Past Surgical History:  Procedure Laterality Date  . bone graph    . CARDIOVERSION N/A 03/25/2017   Procedure: CARDIOVERSION;  Surgeon: Sueanne Margarita, MD;  Location: [facility] ENDOSCOPY;  Service: Cardiovascular;  Laterality: N/A;  . CLIPPING OF ATRIAL APPENDAGE N/A 06/24/2017   Procedure: CLIPPING OF ATRIAL APPENDAGE;  Surgeon: Rexene Alberts, MD;  Location: [place];  Service: Open Heart Surgery;  Laterality: N/A;  . CORONARY ARTERY BYPASS GRAFT N/A 06/24/2017   Procedure: CORONARY ARTERY BYPASS GRAFTING (CABG) times two utilizing left anterior mammary artery and right greater saphenous vein harvested endoscopically;  Surgeon: Rexene Alberts, MD;  Location: [place];  Service: Open Heart Surgery;  Laterality: N/A;  . IABP INSERTION N/A 06/24/2017   Procedure: IABP Insertion;  Surgeon: Martinique, Peter M, MD;  Location: [place] CV LAB;  Service: Cardiovascular;  Laterality: N/A;  . LEFT HEART CATH AND CORONARY ANGIOGRAPHY N/A 06/24/2017   Procedure: LEFT HEART CATH AND CORONARY ANGIOGRAPHY;  Surgeon: Martinique, Peter M, MD;  Location: [place] CV LAB;  Service: Cardiovascular;  Laterality: N/A;  .  MAZE N/A 06/24/2017   Procedure: MAZE;  Surgeon: Rexene Alberts, MD;  Location: [place];  Service: Open Heart Surgery;  Laterality: N/A;  . TEE WITHOUT CARDIOVERSION N/A 06/24/2017   Procedure: TRANSESOPHAGEAL ECHOCARDIOGRAM (TEE);  Surgeon: Rexene Alberts, MD;  Location: [place];  Service: Open Heart Surgery;  Laterality: N/A;  . 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 Order #: [id number] Class: No Print    Allergies Patient  has no known allergies.  Family History  Problem Relation Age of Onset  . Liver cancer Mother 42  . Esophageal cancer Father 31  . Healthy Daughter   . Heart disease Neg Hx     Social History Social History   Tobacco Use  . Smoking status: Never Smoker  . Smokeless tobacco: Never Used  Substance Use Topics  . Alcohol use: Yes    Comment: 1 beer per month  . Drug use: No    Review of Systems  All other systems negative except as documented in the HPI. All pertinent positives and negatives as reviewed in the HPI. ____________________________________________   PHYSICAL EXAM:  VITAL SIGNS: ED Triage Vitals  Enc Vitals Group     BP 09/22/18 0548 (!) 148/83     Pulse Rate 09/22/18 0548 68     Resp 09/22/18 0548 16     Temp 09/22/18 0548 98.2 F (36.8 C)     Temp Source 09/22/18 0548 Oral     SpO2 09/22/18 0545 95 %     Weight 09/22/18 0550 223 lb (101.2 kg)     Height 09/22/18 0550 6\' 2"  (1.88 m)    Constitutional: Alert and oriented. Well appearing and in no acute distress. Eyes: Conjunctivae are normal. PERRL. EOMI. Head: Atraumatic. Nose: No congestion/rhinnorhea. Mouth/Throat: Mucous membranes are moist.  Oropharynx non-erythematous. Neck: No stridor.  No meningeal signs.   Cardiovascular: Normal rate, regular rhythm. Good peripheral circulation. Grossly normal heart sounds.   Respiratory: Normal respiratory effort.  No retractions. Lungs CTAB. Gastrointestinal: Soft and nontender. No distention.  Musculoskeletal: No lower extremity tenderness nor edema. No gross deformities of extremities. Neurologic:  Normal speech and language. No gross focal neurologic deficits are appreciated.  Skin:  Skin is warm, dry and intact. No rash noted.  ____________________________________________   LABS (all labs ordered are listed, but only abnormal results are displayed)  Labs Reviewed  CBC  BASIC METABOLIC PANEL  TROPONIN I    ____________________________________________  EKG   EKG Interpretation  Date/Time:  Monday Sep 22 2018 06:15:42 EDT Ventricular Rate:  66 PR Interval:    QRS Duration: 115 QT Interval:  408 QTC Calculation: 428 R Axis:   11 Text Interpretation:  Sinus rhythm Nonspecific intraventricular conduction delay improved from feb 2019 Confirmed by Merrily Pew [phone]) on 09/22/2018 6:34:19 AM       ____________________________________________  RADIOLOGY  No results found.  ____________________________________________   PROCEDURES  Procedure(s) performed:   Procedures   ____________________________________________   INITIAL IMPRESSION / ASSESSMENT AND PLAN / ED COURSE  Possibly anginal equivalent? Will get labs/ecg/consult cardiology for recommendations.  Care transferred pending labs, reevaluation, d/w cardiology.      Pertinent labs & imaging results that were available during my care of the patient were reviewed by me and considered in my medical decision making (see chart for details).   ____________________________________________  FINAL CLINICAL IMPRESSION(S) / ED DIAGNOSES  Final diagnoses:  None  MEDICATIONS GIVEN DURING THIS VISIT:  Medications  sodium chloride flush (NS) 0.9 % injection 3 mL (has no administration in time range)     NEW OUTPATIENT MEDICATIONS STARTED DURING THIS VISIT:  New Prescriptions   No medications on file    Note:  This note was prepared with assistance of Dragon voice recognition software. Occasional wrong-word or sound-a-like substitutions may have occurred due to the inherent limitations of voice recognition software.   Isolde Skaff, Corene Cornea, MD 09/25/18 [id number]

## 2018-09-22 NOTE — ED Triage Notes (Signed)
BIB EMS from home c/o back pain. Around 0400, pt woke up with cramping pain between his shoulder blades. Noted to be very pale, diaphoretic by first EMS unit on scene. Hx cardiac bypass in 2/19. Pain resolved upon arrival in ED. Denies radiation of pain or other s/sx.

## 2018-09-22 NOTE — Discharge Instructions (Signed)
Your troponins were normal, which means that this is significantly less likely that the event last night was a heart attack.  The cardiologists are setting up an appointment to see you this week to reevaluate you.  Please return to the emergency department for worsening chest pain shortness of breath or if you get real sweaty again.

## 2018-09-22 NOTE — ED Provider Notes (Signed)
73 yo M with a chief complaint of back pain.  Described this as a cramp to his back got diaphoretic short of breath with it.  Patient has a history of to a bypass surgery.  I received the patient in signout from Dr. Dayna Barker.  Plan is for a CT angiogram of the chest if this is negative for dissection then I will discuss with cardiology about observation admission versus delta troponin.  CT scan of the chest was negative for acute dissection or large pulmonary embolism.  Patient continues to be asymptomatic.  The case was discussed with cardiology Dr. Acie Fredrickson felt that the patient was eligible for a delta troponin and then will schedule  close follow-up in the office.  Delta troponin is negative.  Patient continues to be pain-free.  Discharge patient home.   Deno Etienne, DO 09/22/18 1025

## 2018-09-23 ENCOUNTER — Telehealth: Payer: Self-pay | Admitting: Physician Assistant

## 2018-09-24 ENCOUNTER — Other Ambulatory Visit: Payer: Self-pay | Admitting: Physician Assistant

## 2018-09-24 ENCOUNTER — Telehealth (INDEPENDENT_AMBULATORY_CARE_PROVIDER_SITE_OTHER): Payer: Medicare Other | Admitting: Physician Assistant

## 2018-09-24 ENCOUNTER — Encounter: Payer: Self-pay | Admitting: Physician Assistant

## 2018-09-24 VITALS — HR 70 | Ht 73.5 in | Wt 223.0 lb

## 2018-09-24 DIAGNOSIS — R7303 Prediabetes: Secondary | ICD-10-CM

## 2018-09-24 DIAGNOSIS — I25810 Atherosclerosis of coronary artery bypass graft(s) without angina pectoris: Secondary | ICD-10-CM | POA: Diagnosis not present

## 2018-09-24 DIAGNOSIS — M549 Dorsalgia, unspecified: Secondary | ICD-10-CM | POA: Diagnosis not present

## 2018-09-24 DIAGNOSIS — E785 Hyperlipidemia, unspecified: Secondary | ICD-10-CM

## 2018-09-24 DIAGNOSIS — I4819 Other persistent atrial fibrillation: Secondary | ICD-10-CM

## 2018-09-24 MED ORDER — METOPROLOL SUCCINATE ER 25 MG PO TB24: 25.0000 mg | ORAL_TABLET | Freq: Every day | ORAL | 1 refills | Status: DC

## 2018-09-24 NOTE — Patient Instructions (Signed)
Medication Instructions:   Restart Metoprolol Succinate 25 Mg daily  If you need a refill on your cardiac medications before your next appointment, please call your pharmacy.   Lab work:  You will need to come into our office in 2 months-July 2020 for labs (blood work): Fasting Lipid-DO NOT eat or drink past midnight the night before you come into the office for your labs   If you have labs (blood work) drawn today and your tests are completely normal, you will receive your results only by: Marland Kitchen MyChart Message (if you have MyChart) OR . A paper copy in the mail If you have any lab test that is abnormal or we need to change your treatment, we will call you to review the results.  Testing/Procedures:  NONE ordered at this time of appointment   Follow-Up: At [facility], you and your health needs are our priority.  As part of our continuing mission to provide you with exceptional heart care, we have created designated Provider Care Teams.  These Care Teams include your primary Cardiologist (physician) and Advanced Practice Providers (APPs -  Physician Assistants and Nurse Practitioners) who all work together to provide you with the care you need, when you need it. You will need a follow up appointment in 4-6 months.  Please call our office 2 months in advance to schedule this appointment.  You may see Peter Martinique, MD or one of the following Advanced Practice Providers on your designated Care Team: [place], [place] . Fabian Sharp, PA-C  Any Other Special Instructions Will Be Listed Below (If Applicable).

## 2018-09-24 NOTE — Progress Notes (Signed)
Virtual Visit via Video Note   This visit type was conducted due to national recommendations for restrictions regarding the COVID-19 Pandemic (e.g. social distancing) in an effort to limit this patient's exposure and mitigate transmission in our community.  Due to his co-morbid illnesses, this patient is at least at moderate risk for complications without adequate follow up.  This format is felt to be most appropriate for this patient at this time.  All issues noted in this document were discussed and addressed.  A limited physical exam was performed with this format.  Please refer to the patient's chart for his consent to telehealth for [facility].   Date:  09/26/2018   ID:  Hector Clark, DOB 1946/01/02, MRN [id number]  Patient Location: Home Provider Location: Home  PCP:  Lavone Orn, MD  Cardiologist:  Peter Martinique, MD / plan to establish [name] Electrophysiologist:  None   Evaluation Performed:  Follow-Up Visit  Chief Complaint:  ED followup  History of Present Illness:    Hector Clark is a 73 y.o. male with past medical history of CAD s/p CABG in 06/2017, persistent atrial fibrillation s/p MAZE procedure and LAA clip, prediabetes, and hyperlipidemia.  Holter monitor obtained in September 2018 showed atrial fibrillation with episodes of PVCs and nonsustained ventricular tachycardia versus aberrancy.  He had previous DCCV in November 2018 and underwent successful cardioversion.  TEE obtained in February 2019 showed EF 35 to 40%, trace MR and TR.  His Tikosyn was discontinued in June 2019.  During his last office visit with Dr. Saunders Revel in October 2019, he complained of worsening leg cramps, his Lipitor was discontinued for 2 weeks as a trial to see if his symptoms improve.  He has not had any recurrence of atrial fibrillation following his maze procedure.  On the most recent office visit by Dr. Rayann Heman in December 2019, it was noted patient stopped all of his medication  including metoprolol and statin on his own.  It was recommended for the patient to follow-up with electrophysiology as needed from that point and discuss cardiac medication management up with [name] after Dr. Saunders Revel moved to [facility].  More recently, he presented to the ED on 09/22/2018 with back pain.  CT was negative for acute dissection or large PE.  Troponin was negative.  He was discharged to follow-up with cardiology as outpatient.  Patient was contacted today via telephone visit.  He has some joint ache even after stopping the statin, this is clearly not a side effect of the statin medication.  He denies any recent chest discomfort or shortness of breath.  Based on TPN, last lipid panel was obtained on 05/12/2018, HDL 43, LDL 83, total cholesterol 167, triglyceride 206.  He was diagnosed with prediabetes recently with hemoglobin A1c of 6.3.  For some reason, he has self discontinued metoprolol as well.  I recommended him to restart metoprolol succinate.  He has been compliant with aspirin.  We discussed statin therapy, he wished to do a trial of diet and exercise first since his LDL is still close to goal despite coming off of his statin at least 2 months prior to the last lipid panel.  I plan to obtain a repeat fasting lipid panel in 2 months, if cholesterol is still uncontrolled, will discuss with the patient regarding starting low-dose lovastatin or pravastatin.  The patient does not have symptoms concerning for COVID-19 infection (fever, chills, cough, or new shortness of breath).    Past Medical History:  Diagnosis  Date   Chest pain    with nagative cardiolite   Gout    occational flare   Hyperlipemia    Persistent atrial fibrillation    Peyronie disease    S/P CABG x 2 06/24/2017   LIMA to LAD and SVG to OM with [facility] via right thigh   S/P Maze operation for atrial fibrillation 06/24/2017   Left atrial lesion set using bipolar radiofrequency and cryothermy ablation via conventional  median sternotomy with clipping of LA appendage   Visit for monitoring Tikosyn therapy 06/18/2017   Past Surgical History:  Procedure Laterality Date   bone graph     CARDIOVERSION N/A 03/25/2017   Procedure: CARDIOVERSION;  Surgeon: Sueanne Margarita, MD;  Location: [place];  Service: Cardiovascular;  Laterality: N/A;   CLIPPING OF ATRIAL APPENDAGE N/A 06/24/2017   Procedure: CLIPPING OF ATRIAL APPENDAGE;  Surgeon: Rexene Alberts, MD;  Location: [place];  Service: Open Heart Surgery;  Laterality: N/A;   CORONARY ARTERY BYPASS GRAFT N/A 06/24/2017   Procedure: CORONARY ARTERY BYPASS GRAFTING (CABG) times two utilizing left anterior mammary artery and right greater saphenous vein harvested endoscopically;  Surgeon: Rexene Alberts, MD;  Location: [place];  Service: Open Heart Surgery;  Laterality: N/A;   IABP INSERTION N/A 06/24/2017   Procedure: IABP Insertion;  Surgeon: Martinique, Peter M, MD;  Location: [place] CV LAB;  Service: Cardiovascular;  Laterality: N/A;   LEFT HEART CATH AND CORONARY ANGIOGRAPHY N/A 06/24/2017   Procedure: LEFT HEART CATH AND CORONARY ANGIOGRAPHY;  Surgeon: Martinique, Peter M, MD;  Location: [place] CV LAB;  Service: Cardiovascular;  Laterality: N/A;   MAZE N/A 06/24/2017   Procedure: MAZE;  Surgeon: Rexene Alberts, MD;  Location: [place];  Service: Open Heart Surgery;  Laterality: N/A;   TEE WITHOUT CARDIOVERSION N/A 06/24/2017   Procedure: TRANSESOPHAGEAL ECHOCARDIOGRAM (TEE);  Surgeon: Rexene Alberts, MD;  Location: [place];  Service: Open Heart Surgery;  Laterality: N/A;   TOE SURGERY Left    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     Allergies:   Patient has no known allergies.   Social History   Tobacco Use   Smoking status: Never Smoker   Smokeless tobacco: Never Used  Substance Use Topics   Alcohol use: Yes    Comment: 1 beer per month   Drug use: No      Family Hx: The patient's family history includes Esophageal cancer (age of onset: 74) in his father; Healthy in his daughter; Liver cancer (age of onset: 54) in his mother. There is no history of Heart disease.  ROS:   Please see the history of present illness.     All other systems reviewed and are negative.   Prior CV studies:   The following studies were reviewed today:  CABG x 2 on 06/24/2017  Procedure:        Coronary Artery Bypass Grafting x 2              Left Internal Mammary Artery to Distal Left Anterior Descending Coronary Artery             Saphenous Vein Graft to Obtuse Marginal Branch of Left Circumflex Coronary Artery             Endoscopic Vein Harvest from Right Thigh  Maze Procedure              Left atrial lesion set using bipolar radiofrequency and cryothermy ablation             Clipping of left atrial appendage (Atriclip size 37mm)  Labs/Other Tests and Data Reviewed:    EKG:  An ECG dated 09/22/2018 was personally reviewed today and demonstrated:  Normal sinus rhythm with no significant ST-T wave changes.  Recent Labs: 03/10/2018: Magnesium 2.0 09/22/2018: BUN 13; Creatinine, Ser 1.16; Hemoglobin 15.3; Platelets 234; Potassium 4.7; Sodium 140   Recent Lipid Panel Lab Results  Component Value Date/Time   CHOL 110 08/20/2017 10:15 AM   TRIG 82 08/20/2017 10:15 AM   HDL 44 08/20/2017 10:15 AM   CHOLHDL 2.5 08/20/2017 10:15 AM   LDLCALC 50 08/20/2017 10:15 AM    Wt Readings from Last 3 Encounters:  09/24/18 223 lb (101.2 kg)  09/22/18 223 lb (101.2 kg)  06/09/18 223 lb 6.4 oz (101.3 kg)     Objective:    Vital Signs:  Pulse 70    Ht 6' 1.5" (1.867 m)    Wt 223 lb (101.2 kg)    BMI 29.02 kg/m    VITAL SIGNS:  reviewed  ASSESSMENT & PLAN:    1. CAD s/p CABG: Continue aspirin, restart Toprol-XL.  Denies any chest discomfort.  2. Hyperlipidemia: He self discontinued statin medication in October of last year, based on KPN, his lipid  panel obtained in December showed only borderline elevated LDL.  He wished to do a trial of diet and exercise.  I will order fasting lipid panel in 2 months.  3. Prediabetes: Recent hemoglobin A1c was 6.3.  4. Persistent atrial fibrillation s/p Maze procedure and LAA clip: Not on systemic anticoagulation therapy.  No recurrence after Maze procedure and left atrial appendage clip.    COVID-19 Education: The signs and symptoms of COVID-19 were discussed with the patient and how to seek care for testing (follow up with PCP or arrange E-visit).  The importance of social distancing was discussed today.  Time:   Today, I have spent 40 minutes with the patient with telehealth technology discussing the above problems.     Medication Adjustments/Labs and Tests Ordered: Current medicines are reviewed at length with the patient today.  Concerns regarding medicines are outlined above.   Tests Ordered: Orders Placed This Encounter  Procedures   Lipid panel    Medication Changes: Meds ordered this encounter  Medications   metoprolol succinate (TOPROL XL) 25 MG 24 hr tablet    Sig: Take 1 tablet (25 mg total) by mouth daily.    Dispense:  90 tablet    Refill:  1    Disposition:  Follow up in 4 month(s)  Signed, Almyra Deforest, PA  09/26/2018 11:07 PM    [facility]

## 2018-10-17 DIAGNOSIS — M25562 Pain in left knee: Secondary | ICD-10-CM | POA: Diagnosis not present

## 2018-11-17 NOTE — Telephone Encounter (Signed)
Opened in error

## 2018-12-30 ENCOUNTER — Telehealth: Payer: Self-pay | Admitting: Cardiology

## 2018-12-30 NOTE — Telephone Encounter (Signed)
Left message for patient to call and schedule 6 mos follow up appointment with [name] (December 2020).  Also to remind him to have fasting lab work completed.

## 2019-01-19 DIAGNOSIS — D4981 Neoplasm of unspecified behavior of retina and choroid: Secondary | ICD-10-CM | POA: Diagnosis not present

## 2019-01-19 DIAGNOSIS — H5213 Myopia, bilateral: Secondary | ICD-10-CM | POA: Diagnosis not present

## 2019-01-19 DIAGNOSIS — H52223 Regular astigmatism, bilateral: Secondary | ICD-10-CM | POA: Diagnosis not present

## 2019-01-19 DIAGNOSIS — H2513 Age-related nuclear cataract, bilateral: Secondary | ICD-10-CM | POA: Diagnosis not present

## 2019-01-19 DIAGNOSIS — H524 Presbyopia: Secondary | ICD-10-CM | POA: Diagnosis not present

## 2019-01-20 IMAGING — DX DG CHEST 1V PORT
1 series · 1 of 1 positions shown · non-contrast
Comparison: 10/22/2016

CLINICAL DATA: Post op film for CABG today, check support
apparatus.

EXAM:
PORTABLE CHEST 1 VIEW

[chest ap]
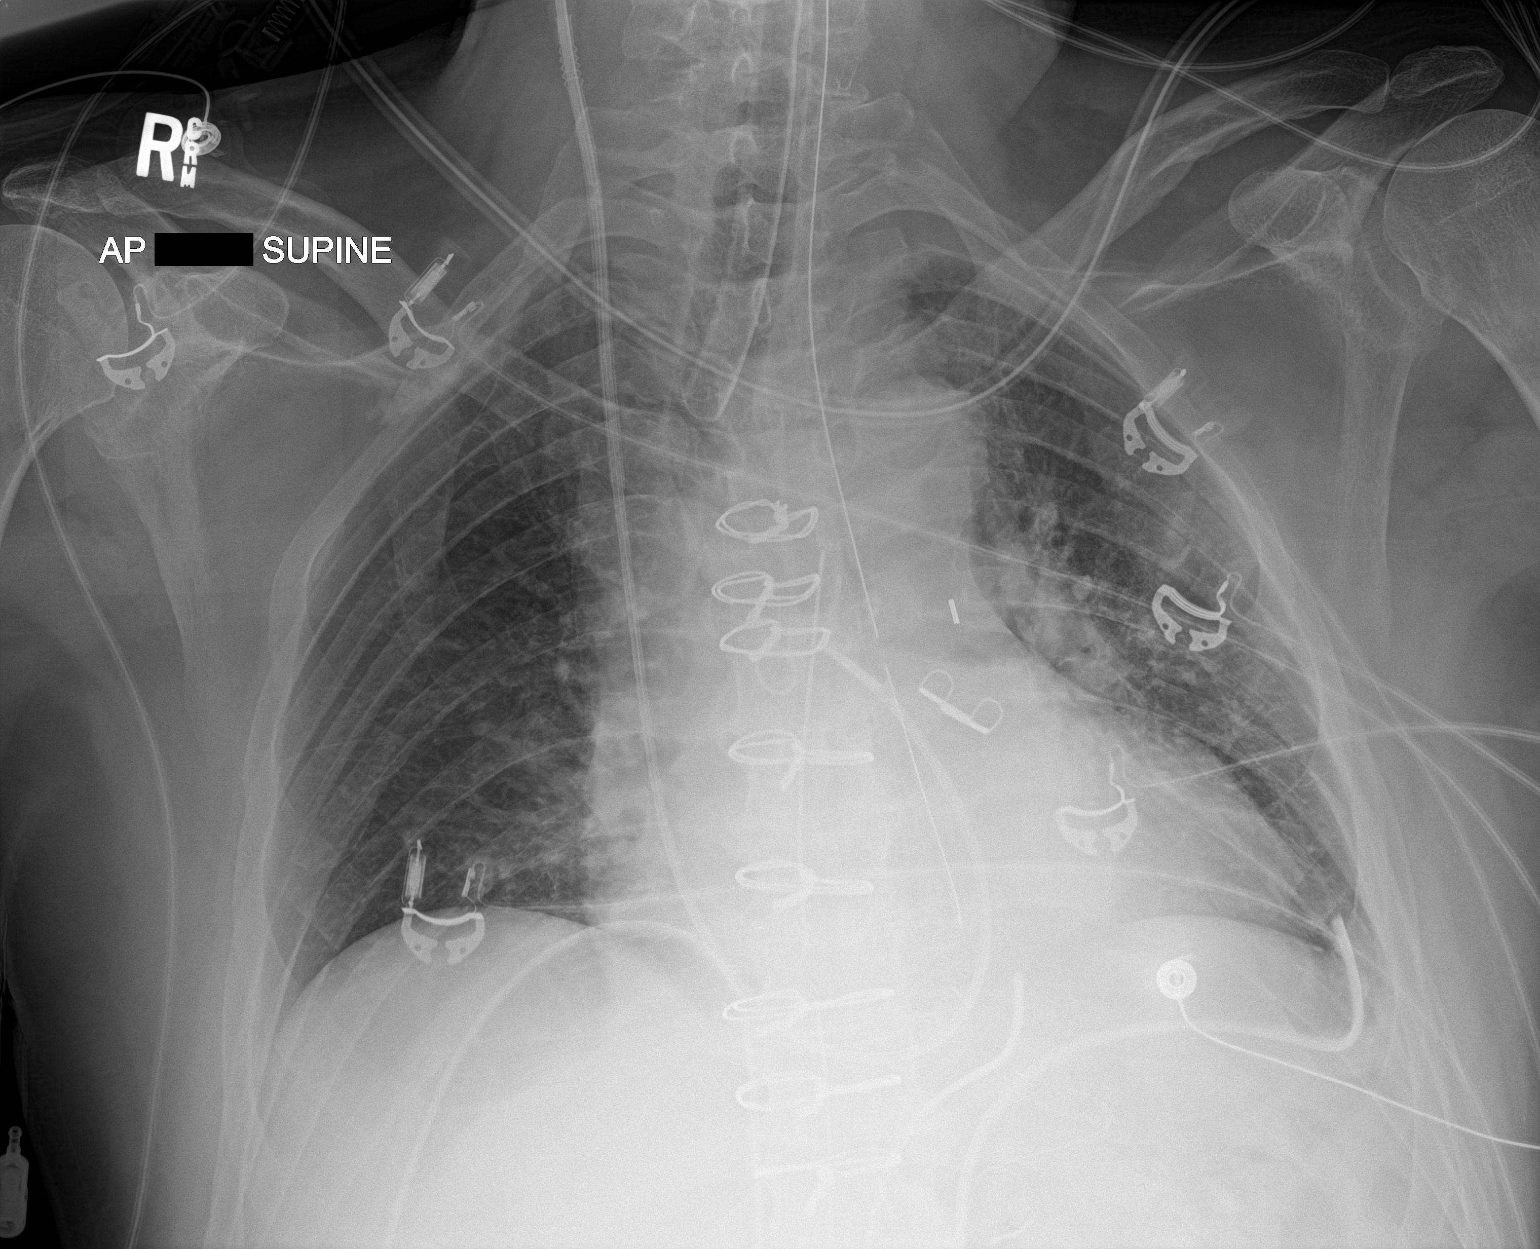

[1 of 1 positions shown; findings below may reference images not displayed]

FINDINGS: Midline sternotomy. Endotracheal tube 5.7 cm from carina. NG tube
extends distal esophagus. Swan-Ganz catheter with tip in the main
pulmonary artery. LEFT chest tube and mediastinal drain noted. No
pneumothorax. No pulmonary edema.
IMPRESSION: 1. Support apparatus in appropriate position.
2. No pulmonary edema or pneumothorax.

## 2019-01-21 IMAGING — DX DG CHEST 1V PORT
1 series · 1 of 1 positions shown · non-contrast
Comparison: One-view chest x-ray 06/24/2017.

CLINICAL DATA: CABG yesterday.

EXAM:
PORTABLE CHEST 1 VIEW

[chest ap]
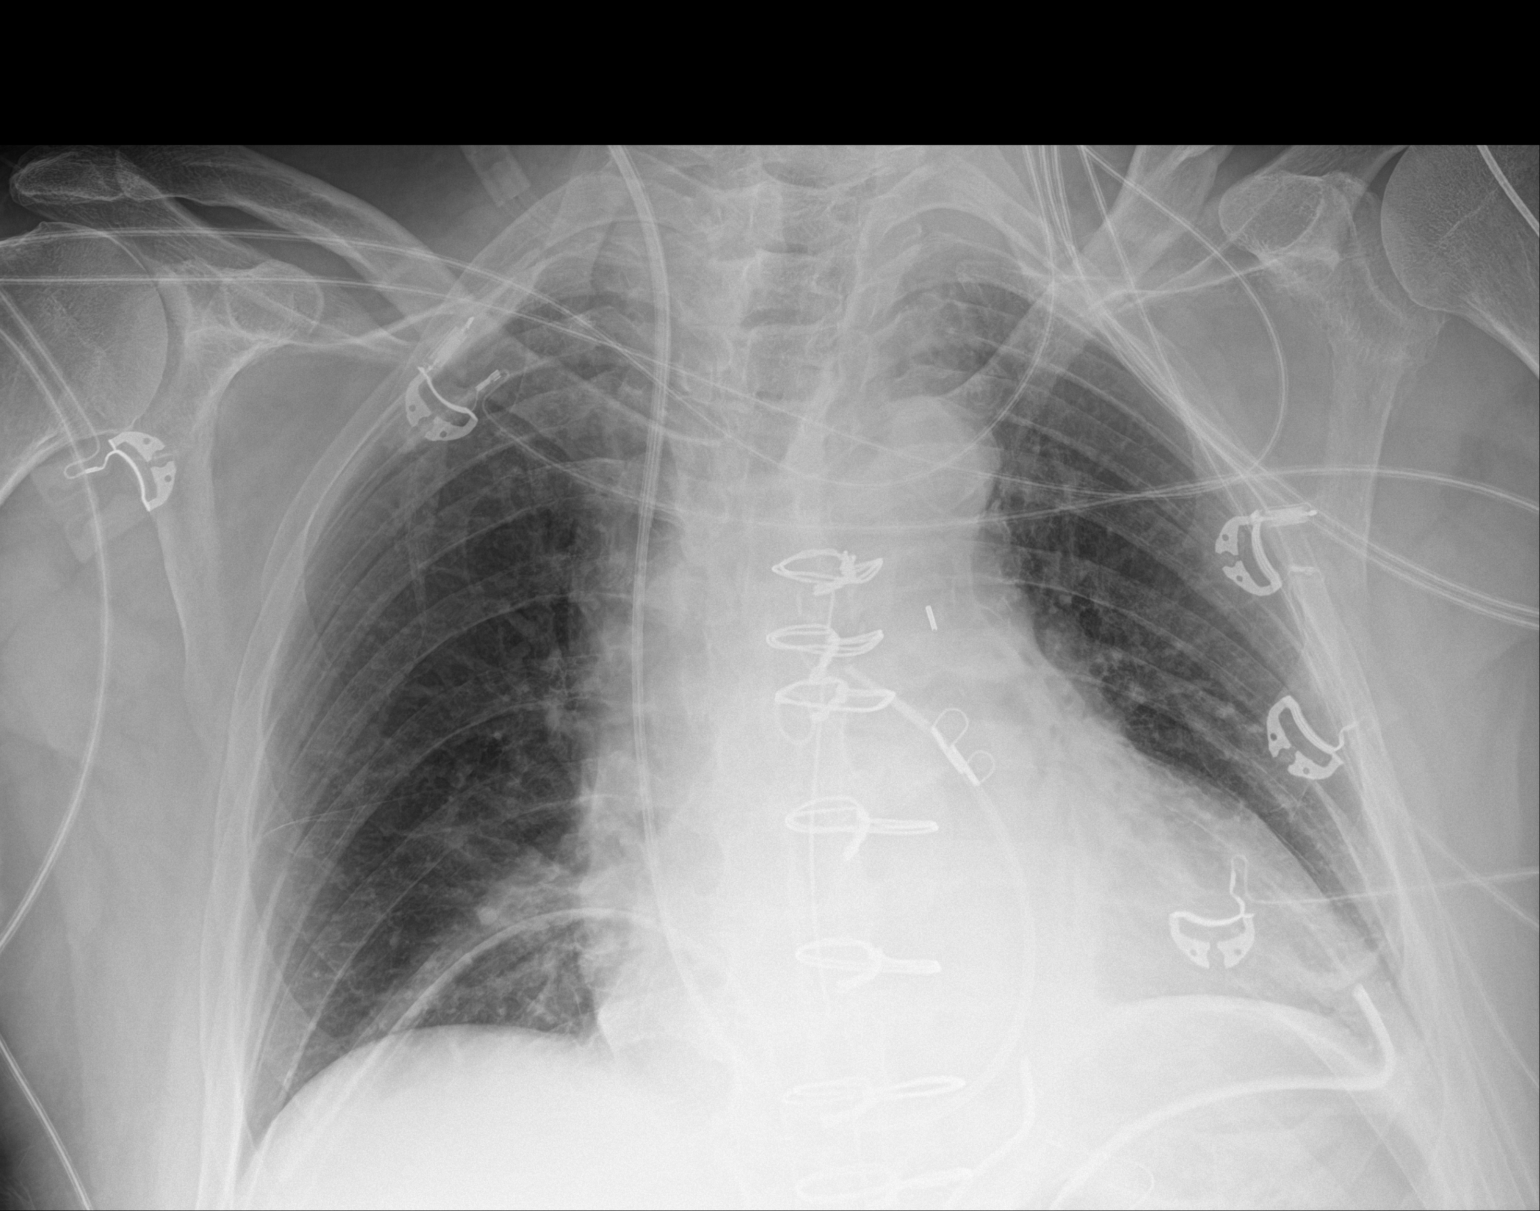

[1 of 1 positions shown; findings below may reference images not displayed]

FINDINGS: Patient has been extubated. The tip of the Swan-Ganz catheter is
stable position within the main pulmonary outflow tract. Mediastinal
drain and bilateral chest tubes are in place. There is no
pneumothorax. Heart size is exaggerated by low lung volumes. There
is no significant edema. A small left effusion is suspected. CABG
and atrial clip are noted.
IMPRESSION: 1. Interval extubation.
2. Support apparatus is stable.
3. Low lung volumes and mild cardiomegaly.
4. No significant edema.
5. Small left pleural effusion.

## 2019-01-23 IMAGING — DX DG CHEST 2V
2 series · 2 of 2 positions shown · non-contrast
Comparison: 06/26/2017

CLINICAL DATA: Atelectasis.  Postop CABG

EXAM:
CHEST  2 VIEW

[chest pa]
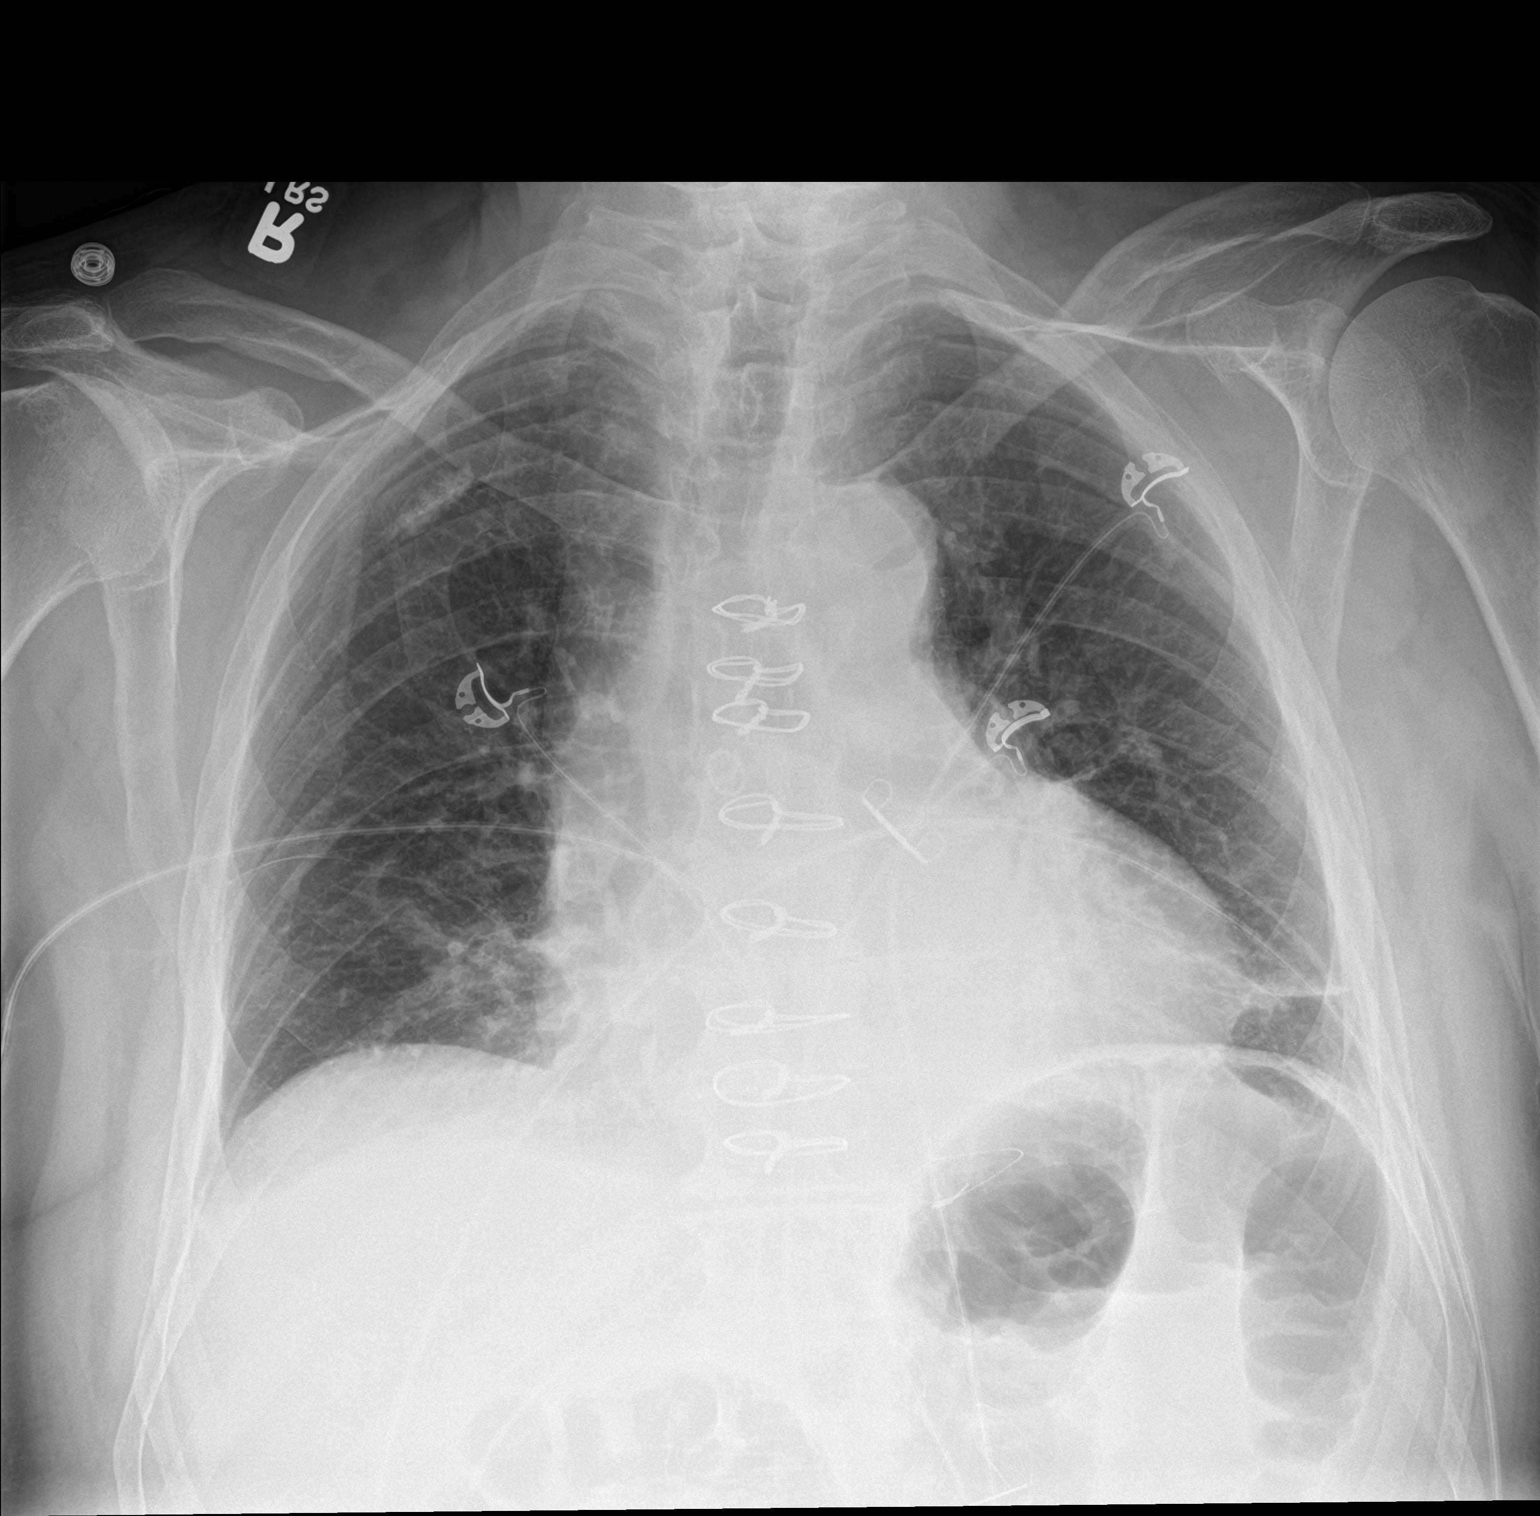

[chest lat]
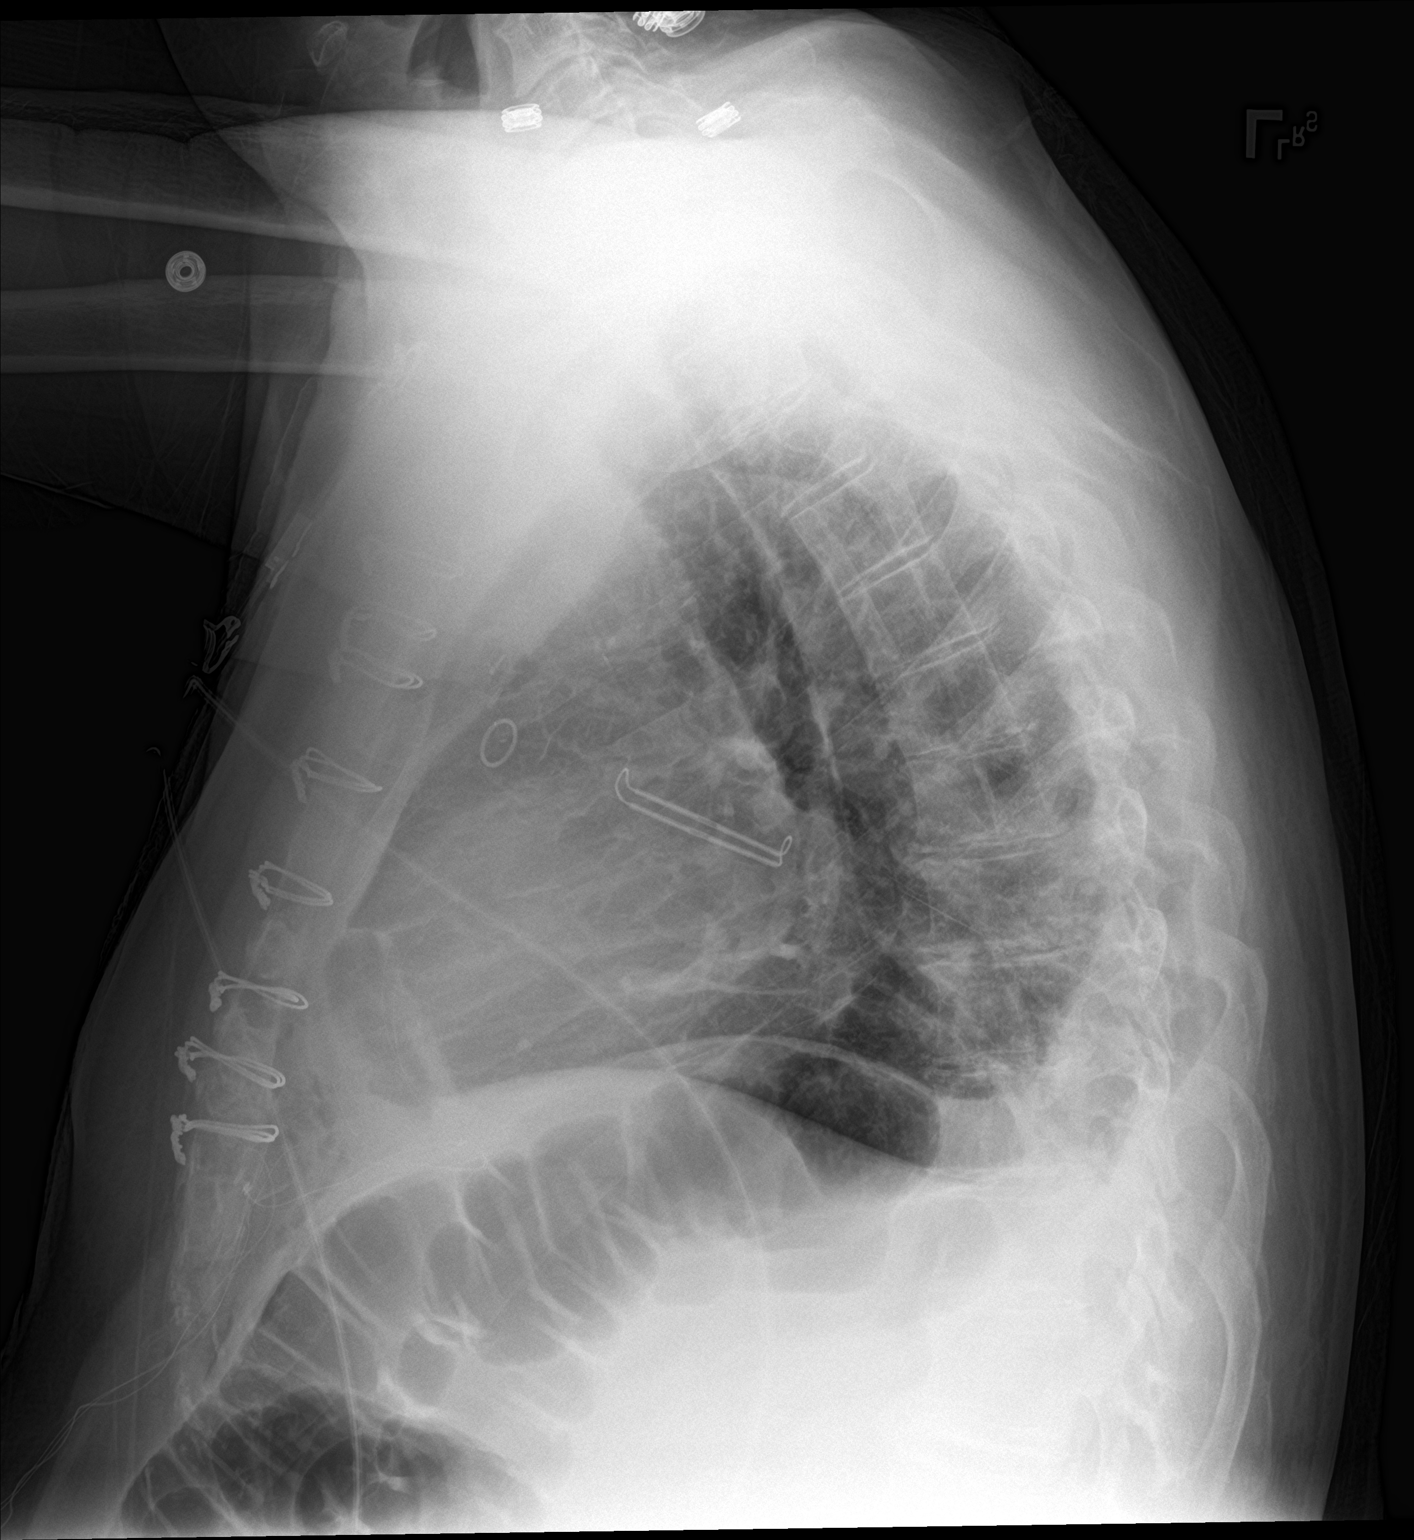

[2 of 2 positions shown; findings below may reference images not displayed]

FINDINGS: Right jugular central venous catheter tip removed. Bilateral chest
tubes removed. No pneumothorax.

Mild bibasilar atelectasis. Negative for edema. Small bilateral
pleural effusions.
IMPRESSION: Mild bibasilar atelectasis and effusion.

## 2019-04-27 NOTE — Progress Notes (Signed)
Virtual Visit via Video Note   This visit type was conducted due to national recommendations for restrictions regarding the COVID-19 Pandemic (e.g. social distancing) in an effort to limit this patient's exposure and mitigate transmission in our community.  Due to his co-morbid illnesses, this patient is at least at moderate risk for complications without adequate follow up.  This format is felt to be most appropriate for this patient at this time.  All issues noted in this document were discussed and addressed.  A limited physical exam was performed with this format.  Please refer to the patient's chart for his consent to telehealth for [facility]   Date:  04/30/2019   ID:  Hector Clark, DOB 1945/06/20, MRN [id number]  Patient Location: Home Provider Location: Home  PCP:  Lavone Orn, MD  Cardiologist:   Martinique, MD  Electrophysiologist:  None   Evaluation Performed:  Follow-Up Visit  Chief Complaint:  Follow up CAD  History of Present Illness:    Hector Clark is a 73 y.o. male with past medical history of CAD s/p CABG in 06/2017, persistent atrial fibrillation s/p MAZE procedure and LAA clip, prediabetes, and hyperlipidemia.  Holter monitor obtained in September 2018 showed atrial fibrillation with episodes of PVCs and nonsustained ventricular tachycardia versus aberrancy.  He had previous DCCV in November 2018 and underwent successful cardioversion.  TEE obtained in February 2019 showed EF 35 to 40%, trace MR and TR.  His Tikosyn was discontinued in June 2019.  During his last office visit with Dr. Saunders Revel in October 2019, he complained of worsening leg cramps, his Lipitor was discontinued for 2 weeks as a trial to see if his symptoms improve.  He has not had any recurrence of atrial fibrillation following his maze procedure.  On the most recent office visit by Dr. Rayann Heman in December 2019, it was noted patient stopped all of his medication including metoprolol and statin on  his own. He presented to the ED on 09/22/2018 with back pain.  CT was negative for acute dissection or large PE.  Troponin was negative.  He was discharged to follow-up with cardiology as outpatient.  He was having a lot of aching in his lower body that did improve after stopping high dose lipitor. 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 He hasn't been exercising or losing weight. He was diagnosed with prediabetes by Dr Laurann Montana with A1c 6.3%. reports BP usually in 120s/65-70. He denies any chest pain or SOB. He is trying to walk more.   The patient does not have symptoms concerning for COVID-19 infection (fever, chills, cough, or new shortness of breath).    Past Medical History:  Diagnosis Date  . Chest pain    with nagative cardiolite  . Gout    occational flare  . Hyperlipemia   . Persistent atrial fibrillation ([place])   . Peyronie disease   . 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 S/P Maze operation for atrial fibrillation 06/24/2017   Left atrial lesion set using bipolar radiofrequency and cryothermy ablation via conventional median sternotomy with clipping of LA appendage  . Visit for monitoring Tikosyn therapy 06/18/2017   Past Surgical History:  Procedure Laterality Date  . bone graph    . CARDIOVERSION N/A 03/25/2017   Procedure: CARDIOVERSION;  Surgeon: Sueanne Margarita, MD;  Location: [place];  Service: Cardiovascular;  Laterality: N/A;  . CLIPPING OF ATRIAL APPENDAGE N/A 06/24/2017   Procedure: CLIPPING OF ATRIAL APPENDAGE;  Surgeon: Rexene Alberts, MD;  Location: [place];  Service: Open Heart Surgery;  Laterality: N/A;  . CORONARY ARTERY BYPASS GRAFT N/A 06/24/2017   Procedure: CORONARY ARTERY BYPASS GRAFTING (CABG) times two utilizing left anterior mammary artery and right greater saphenous vein harvested endoscopically;  Surgeon: Rexene Alberts, MD;  Location: [place];   Service: Open Heart Surgery;  Laterality: N/A;  . IABP INSERTION N/A 06/24/2017   Procedure: IABP Insertion;  Surgeon: Martinique,  M, MD;  Location: [place] CV LAB;  Service: Cardiovascular;  Laterality: N/A;  . LEFT HEART CATH AND CORONARY ANGIOGRAPHY N/A 06/24/2017   Procedure: LEFT HEART CATH AND CORONARY ANGIOGRAPHY;  Surgeon: Martinique,  M, MD;  Location: [place] CV LAB;  Service: Cardiovascular;  Laterality: N/A;  . MAZE N/A 06/24/2017   Procedure: MAZE;  Surgeon: Rexene Alberts, MD;  Location: [place];  Service: Open Heart Surgery;  Laterality: N/A;  . TEE WITHOUT CARDIOVERSION N/A 06/24/2017   Procedure: TRANSESOPHAGEAL ECHOCARDIOGRAM (TEE);  Surgeon: Rexene Alberts, MD;  Location: [place];  Service: Open Heart Surgery;  Laterality: N/A;  . 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 aspirin EC 81 MG EC tablet Take 1 tablet (81 mg total) by mouth daily.  . [DISCONTINUED] aspirin 81 MG chewable tablet Chew 324 mg by mouth once.     Allergies:   Patient has no known allergies.   Social History   Tobacco Use  . Smoking status: Never Smoker  . Smokeless tobacco: Never Used  Substance Use Topics  . Alcohol use: Yes    Comment: 1 beer per month  . Drug use: No     Family Hx: The patient's family history includes Esophageal cancer (age of onset: 24) in his father; Healthy in his daughter; Liver cancer (age of onset: 72) in his mother. There is no history of Heart disease.  ROS:   Please see the history of present illness.     All other systems reviewed and are negative.   Prior CV studies:   The following studies were reviewed today:  CABG x 2 on 06/24/2017  Procedure:        Coronary Artery Bypass Grafting x 2              Left Internal Mammary Artery to Distal Left Anterior Descending Coronary Artery             Saphenous Vein Graft to Obtuse Marginal Branch of Left Circumflex Coronary Artery             Endoscopic Vein  Harvest from Right Thigh    Maze Procedure              Left atrial lesion set using bipolar radiofrequency and cryothermy ablation             Clipping of left atrial appendage (Atriclip size 66mm)  Echo 11/06/17: Study Conclusions  - Left ventricle: The cavity size was normal. Wall thickness was   increased in a pattern of mild LVH. Systolic function was normal.   The estimated ejection fraction was in the range of 50% to 55%.   Diffuse hypokinesis. There was no evidence of elevated   ventricular filling pressure by Doppler parameters. - Aortic valve: Trileaflet; mildly thickened, mildly calcified  leaflets.  Labs/Other Tests and Data Reviewed:    EKG:  None today  Recent Labs: 09/22/2018: BUN 13; Creatinine, Ser 1.16; Hemoglobin 15.3; Platelets 234; Potassium 4.7; Sodium 140   Recent Lipid Panel Lab Results  Component Value Date/Time   CHOL 176 04/28/2019 09:51 AM   TRIG 106 04/28/2019 09:51 AM   HDL 46 04/28/2019 09:51 AM   CHOLHDL 3.8 04/28/2019 09:51 AM   LDLCALC 111 (H) 04/28/2019 09:51 AM    Wt Readings from Last 3 Encounters:  04/30/19 226 lb (102.5 kg)  09/24/18 223 lb (101.2 kg)  09/22/18 223 lb (101.2 kg)     Objective:    Vital Signs:  BP 134/84   Pulse 70   Ht 6\' 2"  (1.88 m)   Wt 226 lb (102.5 kg)   BMI 29.02 kg/m    VITAL SIGNS:  reviewed  ASSESSMENT & PLAN:    1. CAD s/p CABG: Continue aspirin.  He is asymptomatic.  2. Hyperlipidemia: LDL 111. He did have side effects on high dose lipitor. Recommend trying Crestor 10 mg daily. Repeat labs in 3 months.  3. Prediabetes: Recent hemoglobin A1c was 6.3. discussed lifestyle modification with weight loss and carbohydrate restricted diet.   4. Persistent atrial fibrillation s/p Maze procedure and LAA clip: Not on systemic anticoagulation therapy.  No recurrence after Maze procedure and left atrial appendage clip.    COVID-19 Education: The signs and symptoms of COVID-19 were discussed with the  patient and how to seek care for testing (follow up with PCP or arrange E-visit).  The importance of social distancing was discussed today.  Time:   Today, I have spent 30 minutes with the patient with telehealth technology discussing the above problems.     Medication Adjustments/Labs and Tests Ordered: Current medicines are reviewed at length with the patient today.  Concerns regarding medicines are outlined above.   Tests Ordered: No orders of the defined types were placed in this encounter.   Medication Changes: Meds ordered this encounter  Medications  . rosuvastatin (CRESTOR) 10 MG tablet    Sig: Take 1 tablet (10 mg total) by mouth daily.    Dispense:  90 tablet    Refill:  3    Disposition:  Follow up in 6 months  Signed,  Martinique, MD  04/30/2019 8:22 AM    [facility]

## 2019-04-28 DIAGNOSIS — E785 Hyperlipidemia, unspecified: Secondary | ICD-10-CM | POA: Diagnosis not present

## 2019-04-28 LAB — LIPID PANEL
Chol/HDL Ratio: 3.8 ratio (ref 0.0–5.0)
Cholesterol, Total: 176 mg/dL (ref 100–199)
HDL: 46 mg/dL (ref >39)
LDL Chol Calc (NIH): 111 mg/dL — ABNORMAL HIGH (ref 0–99)
Triglycerides: 106 mg/dL (ref 0–149)
VLDL Cholesterol Cal: 19 mg/dL (ref 5–40)

## 2019-04-30 ENCOUNTER — Encounter: Payer: Self-pay | Admitting: Cardiology

## 2019-04-30 ENCOUNTER — Ambulatory Visit: Payer: Medicare Other | Admitting: Cardiology

## 2019-04-30 ENCOUNTER — Telehealth (INDEPENDENT_AMBULATORY_CARE_PROVIDER_SITE_OTHER): Payer: Medicare Other | Admitting: Cardiology

## 2019-04-30 VITALS — BP 134/84 | HR 70 | Ht 74.0 in | Wt 226.0 lb

## 2019-04-30 DIAGNOSIS — E785 Hyperlipidemia, unspecified: Secondary | ICD-10-CM | POA: Diagnosis not present

## 2019-04-30 DIAGNOSIS — E7800 Pure hypercholesterolemia, unspecified: Secondary | ICD-10-CM

## 2019-04-30 DIAGNOSIS — I4819 Other persistent atrial fibrillation: Secondary | ICD-10-CM

## 2019-04-30 DIAGNOSIS — R7303 Prediabetes: Secondary | ICD-10-CM

## 2019-04-30 DIAGNOSIS — I25810 Atherosclerosis of coronary artery bypass graft(s) without angina pectoris: Secondary | ICD-10-CM | POA: Diagnosis not present

## 2019-04-30 DIAGNOSIS — Z951 Presence of aortocoronary bypass graft: Secondary | ICD-10-CM

## 2019-04-30 DIAGNOSIS — I10 Essential (primary) hypertension: Secondary | ICD-10-CM

## 2019-04-30 MED ORDER — ROSUVASTATIN CALCIUM 10 MG PO TABS: 10.0000 mg | ORAL_TABLET | Freq: Every day | ORAL | 3 refills | Status: DC

## 2019-04-30 NOTE — Addendum Note (Signed)
Addended by: Kathyrn Lass on: 04/30/2019 09:40 AM   Modules accepted: Orders

## 2019-04-30 NOTE — Patient Instructions (Addendum)
Start Crestor 10 mg daily  Repeat lab work in 3 months ( Lab orders enclosed )  We will follow up in 6 months  ( Call in March to schedule June appointment )

## 2019-05-04 ENCOUNTER — Other Ambulatory Visit: Payer: Self-pay

## 2019-05-04 DIAGNOSIS — I10 Essential (primary) hypertension: Secondary | ICD-10-CM

## 2019-05-04 DIAGNOSIS — I25810 Atherosclerosis of coronary artery bypass graft(s) without angina pectoris: Secondary | ICD-10-CM

## 2019-05-04 DIAGNOSIS — R7303 Prediabetes: Secondary | ICD-10-CM

## 2019-05-04 DIAGNOSIS — E7800 Pure hypercholesterolemia, unspecified: Secondary | ICD-10-CM

## 2019-05-04 DIAGNOSIS — I4819 Other persistent atrial fibrillation: Secondary | ICD-10-CM

## 2019-06-23 DIAGNOSIS — R7301 Impaired fasting glucose: Secondary | ICD-10-CM | POA: Diagnosis not present

## 2019-06-23 DIAGNOSIS — Z1211 Encounter for screening for malignant neoplasm of colon: Secondary | ICD-10-CM | POA: Diagnosis not present

## 2019-06-23 DIAGNOSIS — I2510 Atherosclerotic heart disease of native coronary artery without angina pectoris: Secondary | ICD-10-CM | POA: Diagnosis not present

## 2019-06-23 DIAGNOSIS — I480 Paroxysmal atrial fibrillation: Secondary | ICD-10-CM | POA: Diagnosis not present

## 2019-06-23 DIAGNOSIS — Z0000 Encounter for general adult medical examination without abnormal findings: Secondary | ICD-10-CM | POA: Diagnosis not present

## 2019-06-23 DIAGNOSIS — E7800 Pure hypercholesterolemia, unspecified: Secondary | ICD-10-CM | POA: Diagnosis not present

## 2019-06-23 DIAGNOSIS — Z1389 Encounter for screening for other disorder: Secondary | ICD-10-CM | POA: Diagnosis not present

## 2019-07-29 DIAGNOSIS — R7303 Prediabetes: Secondary | ICD-10-CM | POA: Diagnosis not present

## 2019-07-29 DIAGNOSIS — I4819 Other persistent atrial fibrillation: Secondary | ICD-10-CM | POA: Diagnosis not present

## 2019-07-29 DIAGNOSIS — I10 Essential (primary) hypertension: Secondary | ICD-10-CM | POA: Diagnosis not present

## 2019-07-29 DIAGNOSIS — E7800 Pure hypercholesterolemia, unspecified: Secondary | ICD-10-CM | POA: Diagnosis not present

## 2019-07-29 DIAGNOSIS — I25810 Atherosclerosis of coronary artery bypass graft(s) without angina pectoris: Secondary | ICD-10-CM | POA: Diagnosis not present

## 2019-07-29 LAB — BASIC METABOLIC PANEL
BUN/Creatinine Ratio: 13 (ref 10–24)
BUN: 11 mg/dL (ref 8–27)
CO2: 20 mmol/L (ref 20–29)
Calcium: 9.1 mg/dL (ref 8.6–10.2)
Chloride: 104 mmol/L (ref 96–106)
Creatinine, Ser: 0.86 mg/dL (ref 0.76–1.27)
GFR calc Af Amer: 100 mL/min/{1.73_m2} (ref >59)
GFR calc non Af Amer: 87 mL/min/{1.73_m2} (ref >59)
Glucose: 119 mg/dL — ABNORMAL HIGH (ref 65–99)
Potassium: 4.7 mmol/L (ref 3.5–5.2)
Sodium: 139 mmol/L (ref 134–144)

## 2019-07-29 LAB — HEPATIC FUNCTION PANEL
ALT: 15 IU/L (ref 0–44)
AST: 16 IU/L (ref 0–40)
Albumin: 4.2 g/dL (ref 3.7–4.7)
Alkaline Phosphatase: 146 IU/L — ABNORMAL HIGH (ref 39–117)
Bilirubin Total: 0.5 mg/dL (ref 0.0–1.2)
Bilirubin, Direct: 0.18 mg/dL (ref 0.00–0.40)
Total Protein: 7 g/dL (ref 6.0–8.5)

## 2019-07-29 LAB — LIPID PANEL
Chol/HDL Ratio: 2.5 ratio (ref 0.0–5.0)
Cholesterol, Total: 112 mg/dL (ref 100–199)
HDL: 45 mg/dL (ref >39)
LDL Chol Calc (NIH): 47 mg/dL (ref 0–99)
Triglycerides: 107 mg/dL (ref 0–149)
VLDL Cholesterol Cal: 20 mg/dL (ref 5–40)

## 2019-07-30 LAB — HEMOGLOBIN A1C
Est. average glucose Bld gHb Est-mCnc: 137 mg/dL
Hgb A1c MFr Bld: 6.4 % — ABNORMAL HIGH (ref 4.8–5.6)

## 2019-07-31 ENCOUNTER — Telehealth: Payer: Self-pay

## 2019-07-31 DIAGNOSIS — I2510 Atherosclerotic heart disease of native coronary artery without angina pectoris: Secondary | ICD-10-CM

## 2019-07-31 DIAGNOSIS — E785 Hyperlipidemia, unspecified: Secondary | ICD-10-CM

## 2019-07-31 DIAGNOSIS — I10 Essential (primary) hypertension: Secondary | ICD-10-CM

## 2019-07-31 DIAGNOSIS — R7303 Prediabetes: Secondary | ICD-10-CM

## 2019-07-31 NOTE — Telephone Encounter (Signed)
Spoke to patient he will have fasting lab bmet,lipid and hepatic panels,a1c in 3 months.Lab orders mailed.

## 2019-07-31 NOTE — Telephone Encounter (Signed)
Spoke to patient about email sent today.Dr.Jordan advised to have fasting lab bmet,lipid and hepatic panels and a1c in 3 months.Lab orders mailed.

## 2019-08-11 ENCOUNTER — Other Ambulatory Visit: Payer: Self-pay

## 2019-08-11 DIAGNOSIS — I10 Essential (primary) hypertension: Secondary | ICD-10-CM

## 2019-08-11 DIAGNOSIS — I2510 Atherosclerotic heart disease of native coronary artery without angina pectoris: Secondary | ICD-10-CM

## 2019-08-11 DIAGNOSIS — R7303 Prediabetes: Secondary | ICD-10-CM

## 2019-08-11 DIAGNOSIS — E785 Hyperlipidemia, unspecified: Secondary | ICD-10-CM

## 2019-08-12 DIAGNOSIS — Z1211 Encounter for screening for malignant neoplasm of colon: Secondary | ICD-10-CM | POA: Diagnosis not present

## 2019-10-30 DIAGNOSIS — R7303 Prediabetes: Secondary | ICD-10-CM | POA: Diagnosis not present

## 2019-10-30 DIAGNOSIS — I2510 Atherosclerotic heart disease of native coronary artery without angina pectoris: Secondary | ICD-10-CM | POA: Diagnosis not present

## 2019-10-30 DIAGNOSIS — I10 Essential (primary) hypertension: Secondary | ICD-10-CM | POA: Diagnosis not present

## 2019-10-30 DIAGNOSIS — E785 Hyperlipidemia, unspecified: Secondary | ICD-10-CM | POA: Diagnosis not present

## 2019-10-31 LAB — BASIC METABOLIC PANEL
BUN/Creatinine Ratio: 17 (ref 10–24)
BUN: 14 mg/dL (ref 8–27)
CO2: 22 mmol/L (ref 20–29)
Calcium: 9.4 mg/dL (ref 8.6–10.2)
Chloride: 102 mmol/L (ref 96–106)
Creatinine, Ser: 0.83 mg/dL (ref 0.76–1.27)
GFR calc Af Amer: 102 mL/min/{1.73_m2} (ref >59)
GFR calc non Af Amer: 88 mL/min/{1.73_m2} (ref >59)
Glucose: 121 mg/dL — ABNORMAL HIGH (ref 65–99)
Potassium: 4.8 mmol/L (ref 3.5–5.2)
Sodium: 139 mmol/L (ref 134–144)

## 2019-10-31 LAB — HEPATIC FUNCTION PANEL
ALT: 18 IU/L (ref 0–44)
AST: 22 IU/L (ref 0–40)
Albumin: 4.2 g/dL (ref 3.7–4.7)
Alkaline Phosphatase: 138 IU/L — ABNORMAL HIGH (ref 48–121)
Bilirubin Total: 0.6 mg/dL (ref 0.0–1.2)
Bilirubin, Direct: 0.19 mg/dL (ref 0.00–0.40)
Total Protein: 7.3 g/dL (ref 6.0–8.5)

## 2019-10-31 LAB — LIPID PANEL
Chol/HDL Ratio: 2.4 ratio (ref 0.0–5.0)
Cholesterol, Total: 119 mg/dL (ref 100–199)
HDL: 50 mg/dL (ref >39)
LDL Chol Calc (NIH): 49 mg/dL (ref 0–99)
Triglycerides: 108 mg/dL (ref 0–149)
VLDL Cholesterol Cal: 20 mg/dL (ref 5–40)

## 2019-10-31 LAB — HEMOGLOBIN A1C
Est. average glucose Bld gHb Est-mCnc: 148 mg/dL
Hgb A1c MFr Bld: 6.8 % — ABNORMAL HIGH (ref 4.8–5.6)

## 2019-11-18 ENCOUNTER — Ambulatory Visit: Payer: Medicare Other | Attending: Internal Medicine

## 2019-11-18 DIAGNOSIS — Z20822 Contact with and (suspected) exposure to covid-19: Secondary | ICD-10-CM

## 2019-11-19 LAB — SARS-COV-2, NAA 2 DAY TAT

## 2019-11-19 LAB — NOVEL CORONAVIRUS, NAA: SARS-CoV-2, NAA: NOT DETECTED

## 2019-12-01 NOTE — Progress Notes (Signed)
Cardiology Office Note   Date:  12/01/2019   ID:  Hector Clark, DOB 04/01/1946, MRN [id number]  PCP:  Lavone Orn, MD  Cardiologist:   Lorren Splawn Martinique, MD   No chief complaint on file.     History of Present Illness: Hector Clark is a 74 y.o. male with a past medical history of CAD s/p CABG in 06/2017, persistent atrial fibrillation s/p MAZE procedure and LAA clip, prediabetes, and hyperlipidemia.  Holter monitor obtained in September 2018 showed atrial fibrillation with episodes of PVCs and nonsustained ventricular tachycardia versus aberrancy.  He had previous DCCV in November 2018 and underwent successful cardioversion.  TEE obtained in February 2019 showed EF 35 to 40%, trace MR and TR. In February 2019 he presented with STEMI with ST elevation in the inferior leads. He was taken to the cath lab emergently and had critical left main stenosis and occluded LCx. He then underwent emergent CABG with Dr Roxy Manns. This included a LIMA to the LAD and SVG to OM. He had MAZE and LA clipping.  His Tikosyn was discontinued in June 2019.    He has not had any recurrence of atrial fibrillation following his MAZE procedure.   He presented to the ED on 09/22/2018 with back pain.  CT was negative for acute dissection or large PE.  Troponin was negative.    On follow up today he reports he is feeling very well. He has gained 4 lbs. Is exercising little. His work is sitting at a desk and he may sit at his desk for hours if he is working on a project. Denies any chest pain, dyspnea. May have mild ankle edema if he sits for a long time.     Past Medical History:  Diagnosis Date  . Chest pain    with nagative cardiolite  . Gout    occational flare  . Hyperlipemia   . Persistent atrial fibrillation ([place])   . Peyronie disease   . 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 S/P Maze operation for atrial fibrillation 06/24/2017   Left atrial lesion set using bipolar  radiofrequency and cryothermy ablation via conventional median sternotomy with clipping of LA appendage  . Visit for monitoring Tikosyn therapy 06/18/2017    Past Surgical History:  Procedure Laterality Date  . bone graph    . CARDIOVERSION N/A 03/25/2017   Procedure: CARDIOVERSION;  Surgeon: Sueanne Margarita, MD;  Location[facility] ENDOSCOPY;  Service: Cardiovascular;  Laterality: N/A;  . CLIPPING OF ATRIAL APPENDAGE N/A 06/24/2017   Procedure: CLIPPING OF ATRIAL APPENDAGE;  Surgeon: Rexene Alberts, MD;  Location: [place];  Service: Open Heart Surgery;  Laterality: N/A;  . CORONARY ARTERY BYPASS GRAFT N/A 06/24/2017   Procedure: CORONARY ARTERY BYPASS GRAFTING (CABG) times two utilizing left anterior mammary artery and right greater saphenous vein harvested endoscopically;  Surgeon: Rexene Alberts, MD;  Location: [place];  Service: Open Heart Surgery;  Laterality: N/A;  . IABP INSERTION N/A 06/24/2017   Procedure: IABP Insertion;  Surgeon: Martinique, Alekxander Isola M, MD;  Location: [place] CV LAB;  Service: Cardiovascular;  Laterality: N/A;  . LEFT HEART CATH AND CORONARY ANGIOGRAPHY N/A 06/24/2017   Procedure: LEFT HEART CATH AND CORONARY ANGIOGRAPHY;  Surgeon: Martinique, Vergie Zahm M, MD;  Location: [place] CV LAB;  Service: Cardiovascular;  Laterality: N/A;  . MAZE N/A 06/24/2017   Procedure: MAZE;  Surgeon: Rexene Alberts, MD;  Location: [facility] OR;  Service: Open Heart Surgery;  Laterality: N/A;  . TEE WITHOUT CARDIOVERSION N/A 06/24/2017   Procedure: TRANSESOPHAGEAL ECHOCARDIOGRAM (TEE);  Surgeon: Rexene Alberts, MD;  Location: [place];  Service: Open Heart Surgery;  Laterality: N/A;  . 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 aspirin EC 81 MG EC tablet Take 1 tablet (81 mg total) by mouth daily.    . rosuvastatin (CRESTOR) 10 MG tablet Take 1 tablet (10 mg total) by mouth daily. 90 tablet 3   No current facility-administered medications for  this visit.    Allergies:   Patient has no known allergies.    Social History:  The patient  reports that he has never smoked. He has never used smokeless tobacco. He reports current alcohol use. He reports that he does not use drugs.   Family History:  The patient's family history includes Esophageal cancer (age of onset: 45) in his father; Healthy in his daughter; Liver cancer (age of onset: 48) in his mother.    ROS:  Please see the history of present illness.   Otherwise, review of systems are positive for none.   All other systems are reviewed and negative.    PHYSICAL EXAM: VS:  There were no vitals taken for this visit. , BMI There is no height or weight on file to calculate BMI. GEN: Well nourished, well developed, in no acute distress  HEENT: normal  Neck: no JVD, carotid bruits, or masses Cardiac: RRR; no murmurs, rubs, or gallops,no edema  Respiratory:  clear to auscultation bilaterally, normal work of breathing GI: soft, nontender, nondistended, + BS MS: no deformity or atrophy  Skin: warm and dry, no rash Neuro:  Strength and sensation are intact Psych: euthymic mood, full affect   EKG:  EKG is ordered today. The ekg ordered today demonstrates NSR rate 74. Old inferior infarct. I have personally reviewed and interpreted this study.    Recent Labs: 10/30/2019: ALT 18; BUN 14; Creatinine, Ser 0.83; Potassium 4.8; Sodium 139    Lipid Panel    Component Value Date/Time   CHOL 119 10/30/2019 0817   TRIG 108 10/30/2019 0817   HDL 50 10/30/2019 0817   CHOLHDL 2.4 10/30/2019 0817   LDLCALC 49 10/30/2019 0817      Wt Readings from Last 3 Encounters:  04/30/19 226 lb (102.5 kg)  09/24/18 223 lb (101.2 kg)  09/22/18 223 lb (101.2 kg)      Other studies Reviewed: Additional studies/ records that were reviewed today include:   CABG x 2 on 06/24/2017  Procedure:   Coronary Artery Bypass Grafting x2 Left Internal Mammary Artery to Distal  Left Anterior Descending Coronary Artery Saphenous Vein Graft to Obtuse Marginal Branch of Left Circumflex Coronary Artery Endoscopic Vein Harvest from RightThigh    Maze Procedure Leftatrial lesion set using bipolar radiofrequency and cryothermy ablation Clipping of left atrial appendage (Atriclip size48mm)  Echo 11/06/17: Study Conclusions  - Left ventricle: The cavity size was normal. Wall thickness was increased in a pattern of mild LVH. Systolic function was normal. The estimated ejection fraction was in the range of 50% to 55%. Diffuse hypokinesis. There was no evidence of elevated ventricular filling pressure by Doppler parameters. - Aortic valve: Trileaflet; mildly thickened, mildly calcified leaflets.  ASSESSMENT AND PLAN:  1.  CAD s/p CABG in Feb 2019: Continue aspirin.  He is asymptomatic.  2. Hyperlipidemia: LDL 41. On Crestor 10 mg daily. Tolerating well. History of intolerance to lipitor.   3. Prediabetes: Recent hemoglobin A1c was increased to 6.8%.  discussed lifestyle modification with weight loss and carbohydrate restricted diet. needs to follow up with Dr Laurann Montana.     4. Persistent atrial fibrillation s/p Maze procedure and LAA clip: Not on systemic anticoagulation therapy.  No recurrence after Maze procedure and left atrial appendage clip.     Current medicines are reviewed at length with the patient today.  The patient does not have concerns regarding medicines.  The following changes have been made:  no change  Labs/ tests ordered today include:  No orders of the defined types were placed in this encounter.    Disposition:   FU with me in 1 year  Signed, Zakeya Junker Martinique, MD  12/01/2019 7:24 AM    [facility] [address] [place], [place], [address] Phone [phone], Fax [phone]

## 2019-12-02 ENCOUNTER — Other Ambulatory Visit: Payer: Self-pay

## 2019-12-02 ENCOUNTER — Ambulatory Visit (INDEPENDENT_AMBULATORY_CARE_PROVIDER_SITE_OTHER): Payer: Medicare Other | Admitting: Cardiology

## 2019-12-02 ENCOUNTER — Encounter: Payer: Self-pay | Admitting: Cardiology

## 2019-12-02 VITALS — BP 110/64 | HR 74 | Ht 73.5 in | Wt 230.6 lb

## 2019-12-02 DIAGNOSIS — I480 Paroxysmal atrial fibrillation: Secondary | ICD-10-CM

## 2019-12-02 DIAGNOSIS — E785 Hyperlipidemia, unspecified: Secondary | ICD-10-CM | POA: Diagnosis not present

## 2019-12-02 DIAGNOSIS — I25810 Atherosclerosis of coronary artery bypass graft(s) without angina pectoris: Secondary | ICD-10-CM

## 2019-12-02 NOTE — Patient Instructions (Addendum)
Increase your exercise  Follow up  With Dr Laurann Montana about your blood sugar  I will see you in on year

## 2019-12-11 ENCOUNTER — Telehealth: Payer: Self-pay

## 2019-12-11 NOTE — Telephone Encounter (Signed)
[facility] form completed and signed by Dr.Jordan.Form faxed to [facility] at fax # [phone].

## 2020-01-13 DIAGNOSIS — Z20822 Contact with and (suspected) exposure to covid-19: Secondary | ICD-10-CM | POA: Diagnosis not present

## 2020-04-19 IMAGING — DX CHEST - 2 VIEW
2 series · 2 of 2 positions shown · non-contrast
Comparison: 07/29/2017

CLINICAL DATA: Back pain

EXAM:
CHEST - 2 VIEW

[chest pa]
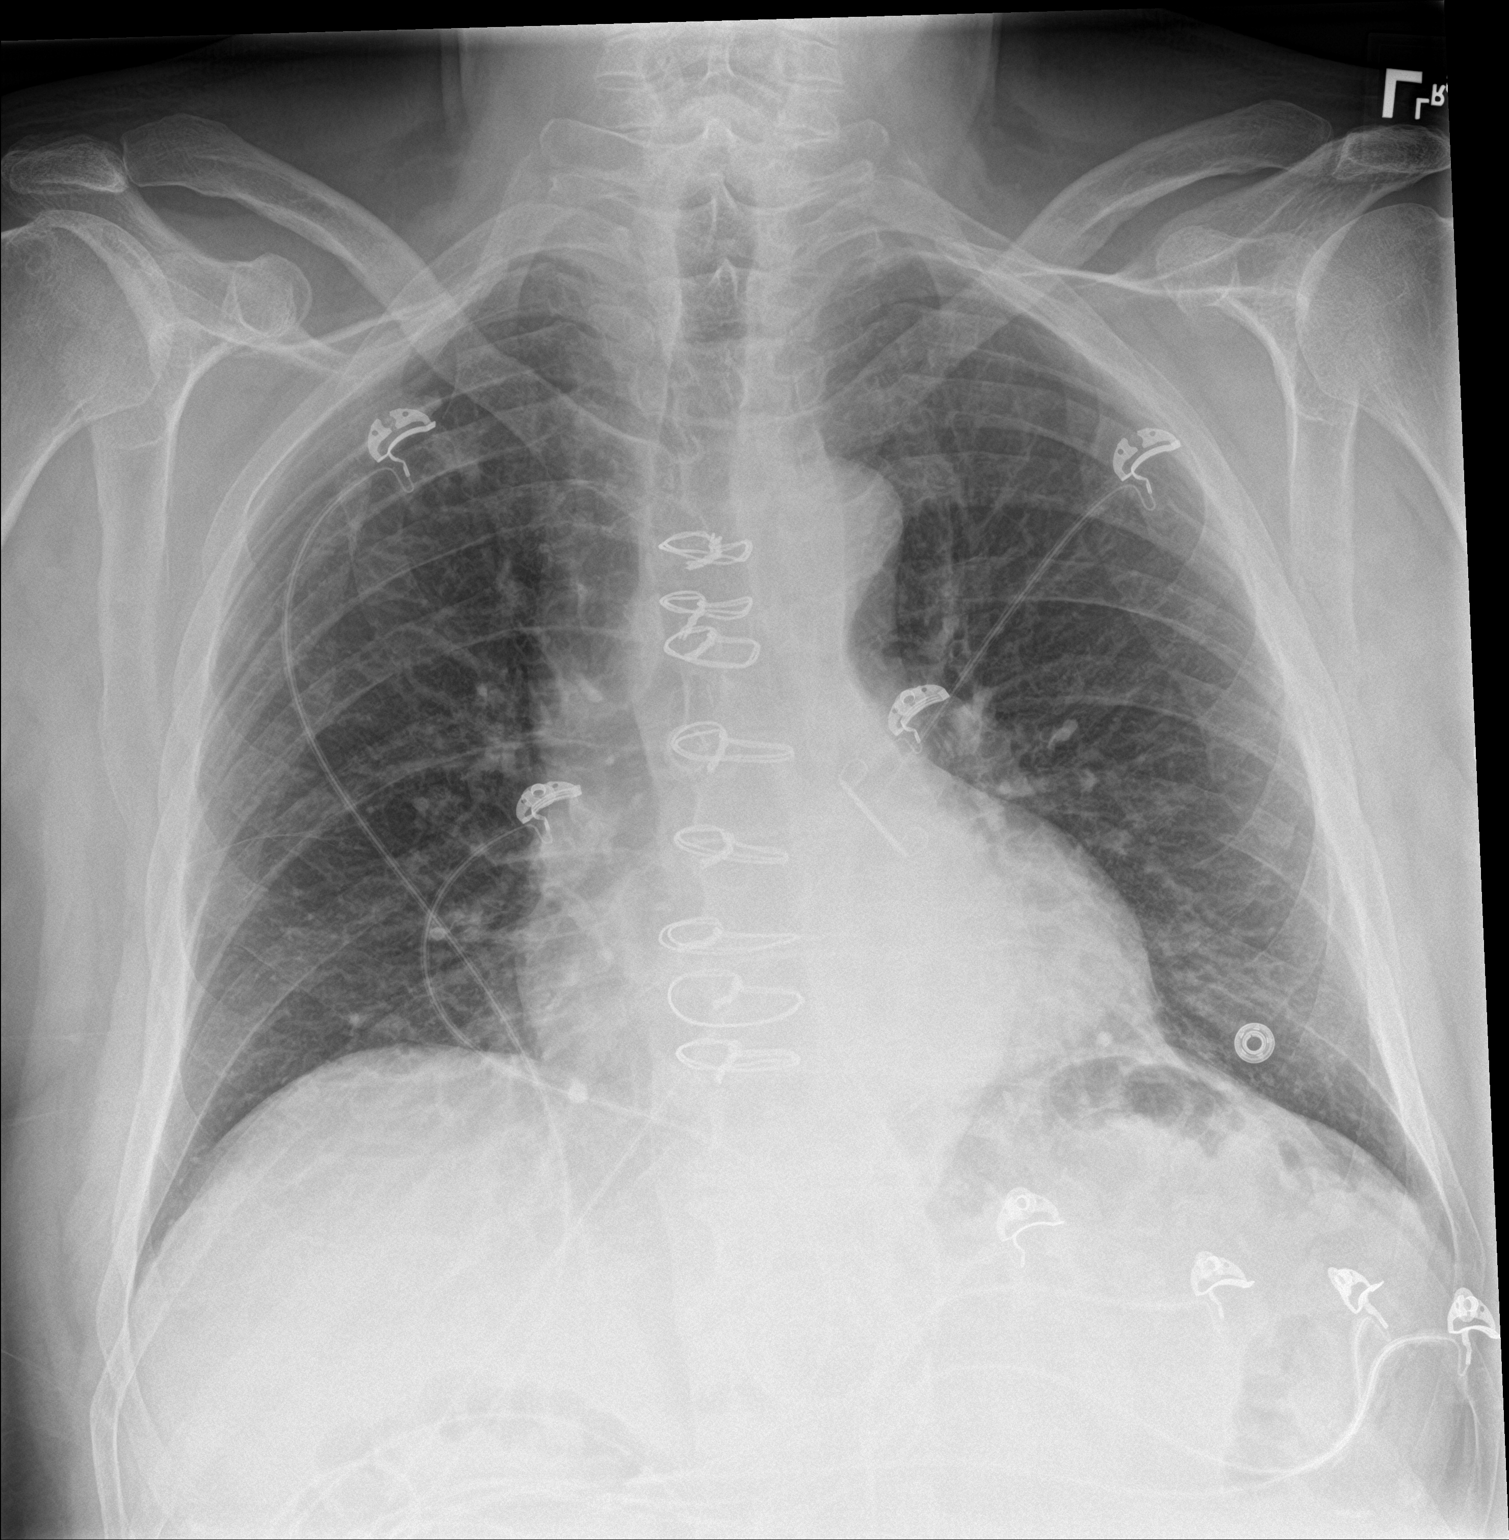

[chest lat]
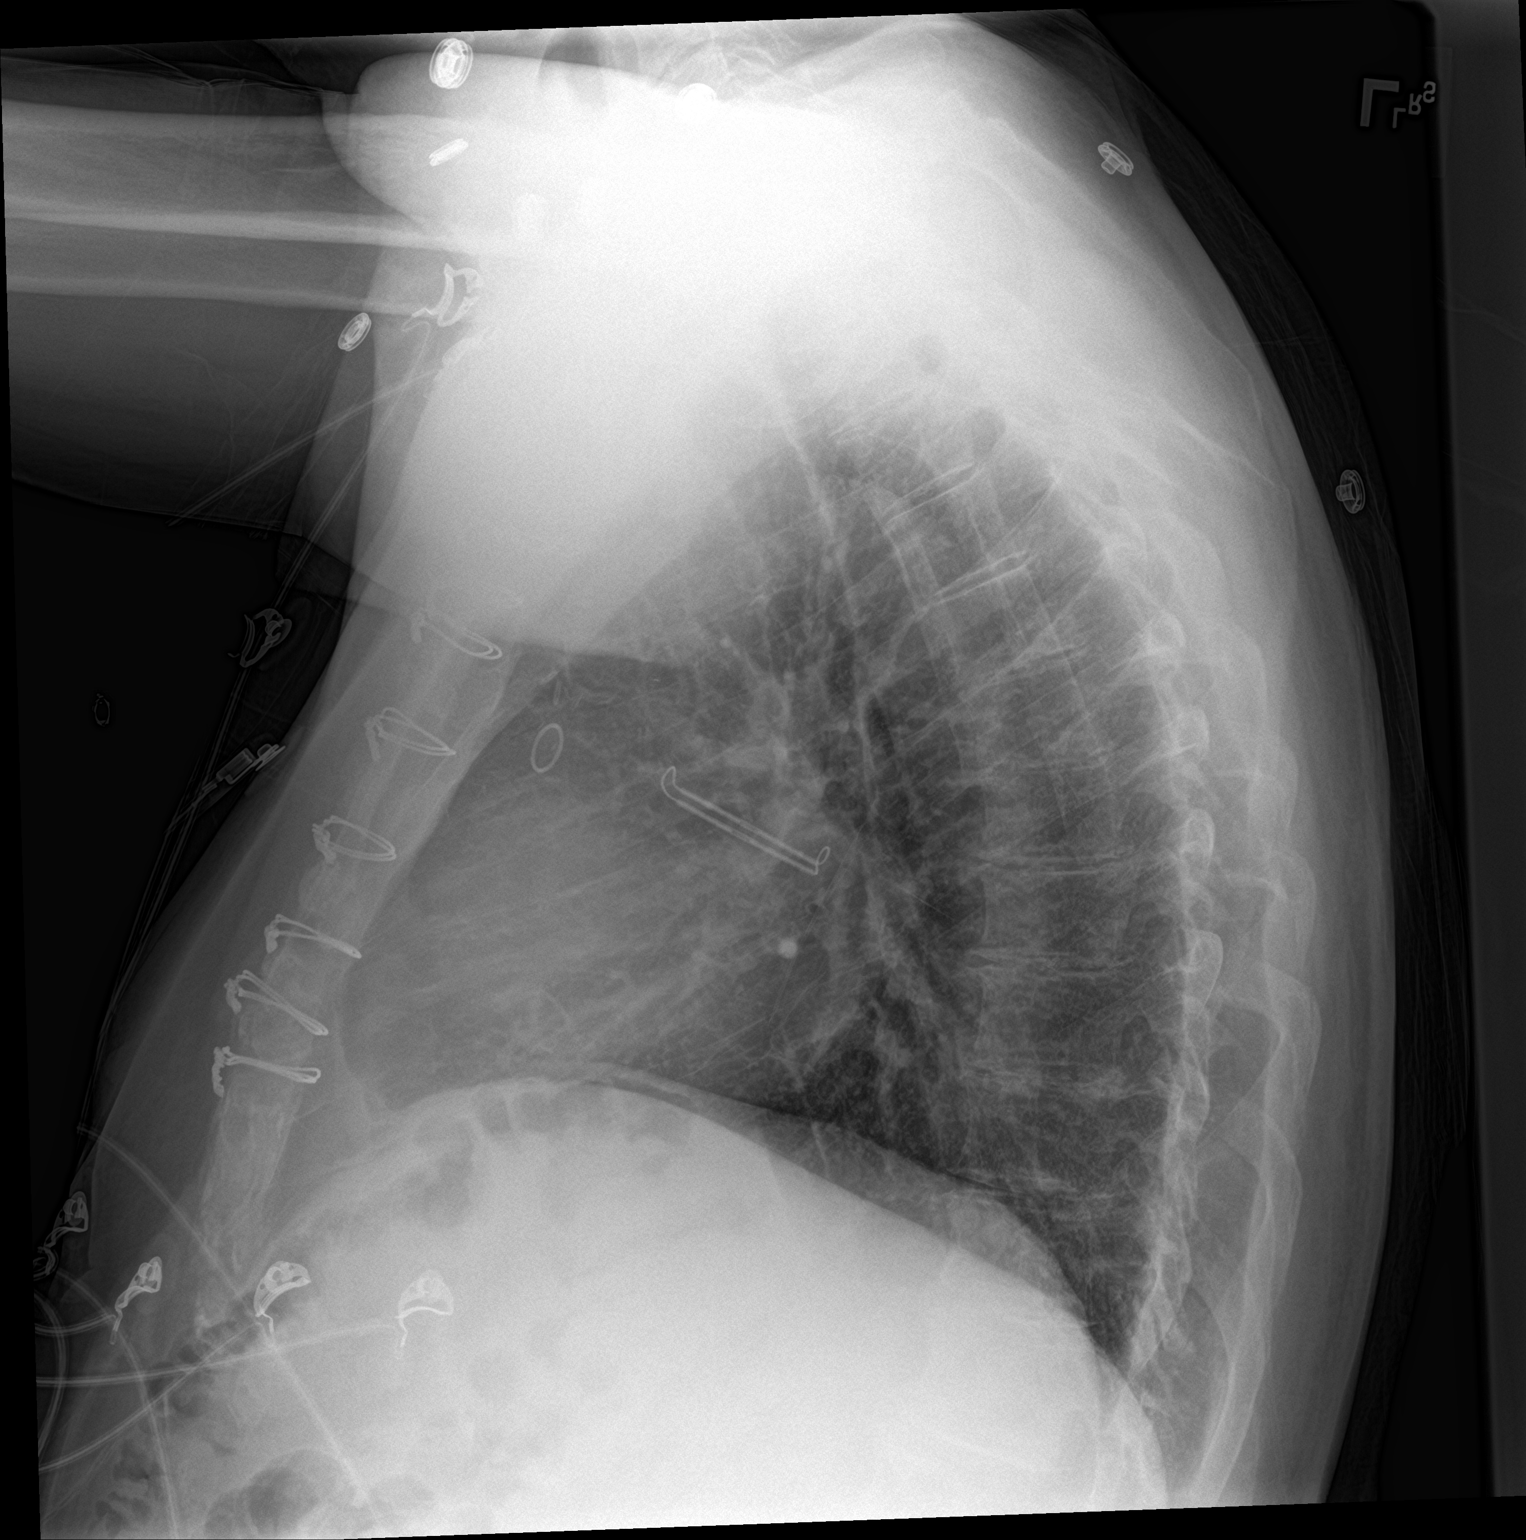

[2 of 2 positions shown; findings below may reference images not displayed]

FINDINGS: Normal heart size. There is aortic tortuosity. CABG and left atrial
clipping. There is no edema, consolidation, effusion, or
pneumothorax.
IMPRESSION: No evidence of active disease.

## 2020-06-15 ENCOUNTER — Telehealth: Payer: Self-pay | Admitting: Cardiology

## 2020-06-15 MED ORDER — ROSUVASTATIN CALCIUM 10 MG PO TABS: 10.0000 mg | ORAL_TABLET | Freq: Every day | ORAL | 1 refills | Status: DC

## 2020-06-15 NOTE — Telephone Encounter (Signed)
Rx has been sent to the pharmacy electronically. ° °

## 2020-06-15 NOTE — Telephone Encounter (Signed)
*  STAT* If patient is at the pharmacy, call can be transferred to refill team.   1. Which medications need to be refilled? (please list name of each medication and dose if known) rosuvastatin (CRESTOR) 10 MG tablet  2. Which pharmacy/location (including street and city if local pharmacy) is medication to be sent to? [facility] - [place], [place] - [address]  3. Do they need a 30 day or 90 day supply? 90  Patient is out of medication.

## 2020-11-22 ENCOUNTER — Telehealth: Payer: Self-pay | Admitting: Cardiology

## 2020-11-22 ENCOUNTER — Other Ambulatory Visit: Payer: Self-pay

## 2020-11-22 ENCOUNTER — Ambulatory Visit (INDEPENDENT_AMBULATORY_CARE_PROVIDER_SITE_OTHER): Payer: Medicare Other | Admitting: *Deleted

## 2020-11-22 ENCOUNTER — Telehealth: Payer: Self-pay | Admitting: *Deleted

## 2020-11-22 VITALS — BP 110/80 | HR 135

## 2020-11-22 DIAGNOSIS — R000 Tachycardia, unspecified: Secondary | ICD-10-CM | POA: Diagnosis not present

## 2020-11-22 NOTE — Telephone Encounter (Signed)
STAT if HR is under 50 or over 120 (normal HR is 60-100 beats per minute)  What is your heart rate? Patient says that HR stay in the 130's and drops to the 42's  Do you have a log of your heart rate readings (document readings)? Yes   Do you have any other symptoms? Congestion, hard to sleep,

## 2020-11-22 NOTE — Progress Notes (Signed)
Reason for visit: ekg, see telephone note from today  Name of MD requesting visit: Martinique  H&P: patient has a hx of atrial fib post maze procedure and LAA clip  ROS related to problem: patient has had an elevated heart rate per his fit bit since June 17th. His heart rate per the app on this phone reports heart rate of over 100 for days at a time and then his heart rate will drop to 42 bpm and gradually increase. He is not having any symptoms except SOB when climbing stairs. He reports this feels different from his atrial fib.   Assessment and plan per MD: patient here for EKG and shows wide complex tachycardia with rate of 135 bpm. Shown to dr berry(DOD), he would like for the patient to be seen by EP to evaluate rhythm prior to starting any new medications. Discussed with the patient, he would like for dr Martinique to know what is going on and look over his ecg. RKG scanned to dr Martinique to review.

## 2020-11-22 NOTE — Telephone Encounter (Signed)
Spoke with pt, recently the patient noticed his heart rate elevated on his fit bit. Once he looked at the app on his phone his heart rate has been elevated up to 135 bpm for hours at a time and then will drop to 42 and gradually climb back up. He reports feeling fine except getting winded when climbing stairs. He does not feel any palpitations but it does wake him from sleep because his breathing gets shallow and he wakes up to take deep breaths. He does not feel as he did in the past when he had atrial fib. He would like to come into the office today for an EKG and then have Dr Martinique look at it later this week. He will come to the office this morning for an EKG.

## 2020-11-22 NOTE — Telephone Encounter (Signed)
Spoke with pt, aware his EKG was reviewed by dr [name] and the patient needs to have the rhythm slowed down to see exactly what the rhythm is. Patient will go over to the [address] office tomorrow morning at 7:30 for an 8 am appointment.

## 2020-11-23 ENCOUNTER — Encounter: Payer: Self-pay | Admitting: Internal Medicine

## 2020-11-23 ENCOUNTER — Ambulatory Visit (INDEPENDENT_AMBULATORY_CARE_PROVIDER_SITE_OTHER): Payer: Medicare Other | Admitting: Internal Medicine

## 2020-11-23 ENCOUNTER — Other Ambulatory Visit: Payer: Self-pay | Admitting: Internal Medicine

## 2020-11-23 ENCOUNTER — Telehealth: Payer: Self-pay

## 2020-11-23 VITALS — BP 108/76 | HR 134 | Ht 74.0 in | Wt 226.2 lb

## 2020-11-23 DIAGNOSIS — I5031 Acute diastolic (congestive) heart failure: Secondary | ICD-10-CM

## 2020-11-23 DIAGNOSIS — I25810 Atherosclerosis of coronary artery bypass graft(s) without angina pectoris: Secondary | ICD-10-CM

## 2020-11-23 DIAGNOSIS — I484 Atypical atrial flutter: Secondary | ICD-10-CM

## 2020-11-23 LAB — CBC
Hematocrit: 46.3 % (ref 37.5–51.0)
Hemoglobin: 15.6 g/dL (ref 13.0–17.7)
MCH: 29.5 pg (ref 26.6–33.0)
MCHC: 33.7 g/dL (ref 31.5–35.7)
MCV: 88 fL (ref 79–97)
Platelets: 312 10*3/uL (ref 150–450)
RBC: 5.28 x10E6/uL (ref 4.14–5.80)
RDW: 13.1 % (ref 11.6–15.4)
WBC: 8.6 10*3/uL (ref 3.4–10.8)

## 2020-11-23 LAB — BASIC METABOLIC PANEL
BUN/Creatinine Ratio: 13 (ref 10–24)
BUN: 13 mg/dL (ref 8–27)
CO2: 22 mmol/L (ref 20–29)
Calcium: 9.7 mg/dL (ref 8.6–10.2)
Chloride: 103 mmol/L (ref 96–106)
Creatinine, Ser: 1.04 mg/dL (ref 0.76–1.27)
Glucose: 117 mg/dL — ABNORMAL HIGH (ref 65–99)
Potassium: 5.3 mmol/L — ABNORMAL HIGH (ref 3.5–5.2)
Sodium: 139 mmol/L (ref 134–144)
eGFR: 76 mL/min/{1.73_m2} (ref >59)

## 2020-11-23 LAB — LIPID PANEL
Chol/HDL Ratio: 3.5 ratio (ref 0.0–5.0)
Cholesterol, Total: 151 mg/dL (ref 100–199)
HDL: 43 mg/dL
LDL Chol Calc (NIH): 92 mg/dL (ref 0–99)
Triglycerides: 83 mg/dL (ref 0–149)
VLDL Cholesterol Cal: 16 mg/dL (ref 5–40)

## 2020-11-23 MED ORDER — FUROSEMIDE 40 MG PO TABS: 40.0000 mg | ORAL_TABLET | Freq: Every day | ORAL | 3 refills | Status: DC

## 2020-11-23 MED ORDER — APIXABAN 5 MG PO TABS: 5.0000 mg | ORAL_TABLET | Freq: Two times a day (BID) | ORAL | 3 refills | Status: DC

## 2020-11-23 MED ORDER — METOPROLOL TARTRATE 25 MG PO TABS: 25.0000 mg | ORAL_TABLET | Freq: Two times a day (BID) | ORAL | 3 refills | Status: DC

## 2020-11-23 NOTE — Addendum Note (Signed)
Addended by: Carylon Perches on: 11/23/2020 12:29 PM   Modules accepted: Orders

## 2020-11-23 NOTE — Telephone Encounter (Signed)
Pt was prescribed Eliquis 5mg  this morning by Dr [name]. He was given 2 boxes of samples. I advised him to call his Insurance to find out which anticoagulation they would cover. Dr [name] and the Pt prefers it not be Coumadin.  I called the pharmacy to find out if the Rx went through and it did not. It states that it is not on his formulary.  I called the pt and he has not called his pharmacy yet but states that he will call them and he is going to try and switch his plan to something that will cover his medications better.  He stated that he would pay out of pocket for next month if he has to.  He will let us know what he finds out.

## 2020-11-23 NOTE — Patient Instructions (Signed)
Medication Instructions:  Your physician has recommended you make the following change in your medication:   STOP: Aspirin START: Eliquis 5mg  twice daily - Take 2 (10mg ) now, 1 (5mg ) this evening then 1 tablet twice daily starting tomorrow.  START: Metoprolol Tartrate 25mg  twice daily START: Furosemide 40mg  daily  *If you need a refill on your cardiac medications before your next appointment, please call your pharmacy*   Follow-Up: At [facility], you and your health needs are our priority.  As part of our continuing mission to provide you with exceptional heart care, we have created designated Provider Care Teams.  These Care Teams include your primary Cardiologist (physician) and Advanced Practice Providers (APPs -  Physician Assistants and Nurse Practitioners) who all work together to provide you with the care you need, when you need it.   Your next appointment:   As Scheduled  Other Instructions  You are scheduled for a TEE/Cardioversion on 11/25/2020 with Dr. Harrell Gave.  Please arrive at the [facility] (Main Entrance A) at [facility]: [address] [place], [place] [address] at 7:30 am.   DIET: Nothing to eat or drink after midnight except a sip of water with medications (see medication instructions below)  FYI: For your safety, and to allow [place] to monitor your vital signs accurately during the surgery/procedure we request that   if you have artificial nails, gel coating, SNS etc. Please have those removed prior to your surgery/procedure. Not having the nail coverings /polish removed may result in cancellation or delay of your surgery/procedure.   Medication Instructions: Hold Furosemide the morning of your procedure  Continue your anticoagulant: Eliquis You will need to continue your anticoagulant after your procedure until you  are told by your  Provider that it is safe to stop   Labs: TODAY: BMET, CBC, FLP  You must have a responsible person to drive you  home and stay in the waiting area during your procedure. Failure to do so could result in cancellation.  Bring your insurance cards.  *Special Note: Every effort is made to have your procedure done on time. Occasionally there are emergencies that occur at the hospital that may cause delays. Please be patient if a delay does occur.

## 2020-11-23 NOTE — Progress Notes (Signed)
ELECTROPHYSIOLOGY CONSULT NOTE  Patient ID: Hector Clark, MRN: [id number], DOB/AGE: 1946/02/27 75 y.o. Admit date: (Not on file) Date of Consult: 11/23/2020  Primary Physician: Lavone Orn, MD Primary Cardiologist: Regina Coppolino is a 75 y.o. male who is being seen today for the evaluation of TACHYCARDIA at the request of DrjbE.    HPI Hector Clark is a 75 y.o. male referred from [facility] Because of persistent tachycardia retrospectively, noted to have gone back to June 17 off of his Fitbit, and associated with dyspnea on exertion and nocturnal dyspnea he was seen.  A tachycardia at 134 was noted with a P wave in the descending limb of the ST segment but concern was atrial flutter-2: 1  Hx of CAD with prior CABG 2/19, hx of persistent afib,s/p MAZE  with LAA clipping   DCCV 2018  DATE TEST EF   2/19 TEE  35-40 %   2/19 LHC  % LM "critical" CX T;   6/*19 Echo  50-55%    Date Cr K Hgb  6/21 0.83 4.8           Thromboembolic risk factors ( age -1, Vasc disease -1) for a CHADSVASc Score of >=2    Past Medical History:  Diagnosis Date   Chest pain    with nagative cardiolite   Gout    occational flare   Hyperlipemia    Persistent atrial fibrillation ([facility])    Peyronie disease    S/P CABG x 2 06/24/2017   LIMA to LAD and SVG to OM with [facility] via right thigh   S/P Maze operation for atrial fibrillation 06/24/2017   Left atrial lesion set using bipolar radiofrequency and cryothermy ablation via conventional median sternotomy with clipping of LA appendage   Visit for monitoring Tikosyn therapy 06/18/2017      Surgical History:  Past Surgical History:  Procedure Laterality Date   bone graph     CARDIOVERSION N/A 03/25/2017   Procedure: CARDIOVERSION;  Surgeon: Sueanne Margarita, MD;  Location: [place];  Service: Cardiovascular;  Laterality: N/A;   CLIPPING OF ATRIAL APPENDAGE N/A 06/24/2017   Procedure: CLIPPING OF ATRIAL APPENDAGE;  Surgeon: Rexene Alberts, MD;  Location: [place];  Service: Open Heart Surgery;  Laterality: N/A;   CORONARY ARTERY BYPASS GRAFT N/A 06/24/2017   Procedure: CORONARY ARTERY BYPASS GRAFTING (CABG) times two utilizing left anterior mammary artery and right greater saphenous vein harvested endoscopically;  Surgeon: Rexene Alberts, MD;  Location: [place];  Service: Open Heart Surgery;  Laterality: N/A;   IABP INSERTION N/A 06/24/2017   Procedure: IABP Insertion;  Surgeon: Martinique, Peter M, MD;  Location: [place] CV LAB;  Service: Cardiovascular;  Laterality: N/A;   LEFT HEART CATH AND CORONARY ANGIOGRAPHY N/A 06/24/2017   Procedure: LEFT HEART CATH AND CORONARY ANGIOGRAPHY;  Surgeon: Martinique, Peter M, MD;  Location: [place] CV LAB;  Service: Cardiovascular;  Laterality: N/A;   MAZE N/A 06/24/2017   Procedure: MAZE;  Surgeon: Rexene Alberts, MD;  Location: [place];  Service: Open Heart Surgery;  Laterality: N/A;   TEE WITHOUT CARDIOVERSION N/A 06/24/2017   Procedure: TRANSESOPHAGEAL ECHOCARDIOGRAM (TEE);  Surgeon: Rexene Alberts, MD;  Location: [place];  Service: Open Heart Surgery;  Laterality: N/A;   TOE SURGERY Left    4th toe   tophus       Home Meds: Current Meds  Medication Sig   rosuvastatin (CRESTOR) 10 MG  tablet Take 1 tablet (10 mg total) by mouth daily.   [DISCONTINUED] aspirin EC 81 MG EC tablet Take 1 tablet (81 mg total) by mouth daily.    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  A Seery call Peter Martinique all other systems reviewed and negative.    Physical Exam: Blood pressure 108/76, pulse (!) 134, height 6\' 2"  (1.88 m), weight 226 lb 3.2 oz (102.6 kg), SpO2 98 %. General: Well developed, well nourished male in no acute distress. Head: Normocephalic, atraumatic, sclera non-icteric, no xanthomas, nares are without discharge. EENT: normal  Lymph Nodes:  none Neck: Negative for carotid bruits. JVD 8 Back:without scoliosis kyphosis Lungs: Clear bilaterally to auscultation without wheezes, rales, or rhonchi. Breathing is unlabored. Heart: Rapid RR with S1 S2.no murmur . No rubs, or gallops appreciated. Abdomen: Soft, non-tender, non-distended with normoactive bowel sounds. No hepatomegaly. No rebound/guarding. No obvious abdominal masses. Msk:  Strength and tone appear normal for age. Extremities: No clubbing or cyanosis.  2+ edema.  Distal pedal pulses are 2+ and equal bilaterally. Skin: Warm and Dry Neuro: Alert and oriented X 3. CN III-XII intact Grossly normal sensory and motor function . Psych:  Responds to questions appropriately with a normal affect.      Labs: Cardiac Enzymes No results for input(s): CKTOTAL, CKMB, TROPONINI in the last 72 hours. CBC Lab Results  Component Value Date   WBC 8.0 09/22/2018   HGB 15.3 09/22/2018   HCT 47.1 09/22/2018   MCV 90.9 09/22/2018   PLT 234 09/22/2018    PROTIME: No results for input(s): LABPROT, INR in the last 72 hours. Chemistry No results for input(s): NA, K, CL, CO2, BUN, CREATININE, CALCIUM, PROT, BILITOT, ALKPHOS, ALT, AST, GLUCOSE in the last 168 hours.  Invalid input(s): LABALBU Lipids Lab Results  Component Value Date   CHOL 119 10/30/2019   HDL 50 10/30/2019   LDLCALC 49 10/30/2019   TRIG 108 10/30/2019   BNP No results found for: PROBNP Thyroid Function Tests: No results for input(s): TSH, T4TOTAL, T3FREE, THYROIDAB in the last 72 hours.  Invalid input(s): FREET3 Miscellaneous No results found for: DDIMER  Radiology/Studies:  No  results found.  EKG: Tachycardia at 134 Left bundle branch block Intervals-/15/37 Carotid massage demonstrated atrial flutter-atypical with a cycle length of 240 ms   Assessment and Plan:  Atrial flutter-atypical  Congestive heart failure-acute-presumably diastolic  Coronary artery disease with prior bypass/maze/left atrial clipping   The patient has persistent atrial flutter now for about 3 weeks.  He will need anticoagulation in anticipation of cardioversion and thereafter.  Given the congestive status and the very difficult challenge of controlling rate will anticipate TEE guided cardioversion.  Have discussed this with Dr. Shirlee More.  Begin him on apixaban 5 mg twice daily; last creatinine 6/21 was 0.83.  We will check this again.  Begin him on furosemide 40 mg p.o. daily  Will discontinue aspirin  Long-term antiarrhythmic therapy options I think should be informed by the frequency of recurrence, drug therapy, ablation therapy if the frequency is high and perhaps intermittent cardioversion if the frequency remains low.  Last LDL was at range at 49.  That was a year ago we will recheck it today.       Virl Axe

## 2020-11-23 NOTE — H&P (View-Only) (Signed)
ELECTROPHYSIOLOGY CONSULT NOTE  Patient ID: Hector Clark, MRN: [id number], DOB/AGE: 07-10-1945 75 y.o. Admit date: (Not on file) Date of Consult: 11/23/2020  Primary Physician: Lavone Orn, MD Primary Cardiologist: Karston Hyland is a 75 y.o. male who is being seen today for the evaluation of TACHYCARDIA at the request of DrjbE.    HPI Hector Clark is a 75 y.o. male referred from [facility] Because of persistent tachycardia retrospectively, noted to have gone back to June 17 off of his Fitbit, and associated with dyspnea on exertion and nocturnal dyspnea he was seen.  A tachycardia at 134 was noted with a P wave in the descending limb of the ST segment but concern was atrial flutter-2: 1  Hx of CAD with prior CABG 2/19, hx of persistent afib,s/p MAZE  with LAA clipping   DCCV 2018  DATE TEST EF   2/19 TEE  35-40 %   2/19 LHC  % LM "critical" CX T;   6/*19 Echo  50-55%    Date Cr K Hgb  6/21 0.83 4.8           Thromboembolic risk factors ( age -25, Vasc disease -1) for a CHADSVASc Score of >=2    Past Medical History:  Diagnosis Date   Chest pain    with nagative cardiolite   Gout    occational flare   Hyperlipemia    Persistent atrial fibrillation (HCC)    Peyronie disease    S/P CABG x 2 06/24/2017   LIMA to LAD and SVG to OM with [facility] via right thigh   S/P Maze operation for atrial fibrillation 06/24/2017   Left atrial lesion set using bipolar radiofrequency and cryothermy ablation via conventional median sternotomy with clipping of LA appendage   Visit for monitoring Tikosyn therapy 06/18/2017      Surgical History:  Past Surgical History:  Procedure Laterality Date   bone graph     CARDIOVERSION N/A 03/25/2017   Procedure: CARDIOVERSION;  Surgeon: Sueanne Margarita, MD;  Location: [facility];  Service: Cardiovascular;  Laterality: N/A;   CLIPPING OF ATRIAL APPENDAGE N/A 06/24/2017   Procedure: CLIPPING OF ATRIAL APPENDAGE;  Surgeon: Rexene Alberts, MD;  Location: [place];  Service: Open Heart Surgery;  Laterality: N/A;   CORONARY ARTERY BYPASS GRAFT N/A 06/24/2017   Procedure: CORONARY ARTERY BYPASS GRAFTING (CABG) times two utilizing left anterior mammary artery and right greater saphenous vein harvested endoscopically;  Surgeon: Rexene Alberts, MD;  Location: [place];  Service: Open Heart Surgery;  Laterality: N/A;   IABP INSERTION N/A 06/24/2017   Procedure: IABP Insertion;  Surgeon: Martinique, Peter M, MD;  Location: [place] [facility];  Service: Cardiovascular;  Laterality: N/A;   LEFT HEART CATH AND CORONARY ANGIOGRAPHY N/A 06/24/2017   Procedure: LEFT HEART CATH AND CORONARY ANGIOGRAPHY;  Surgeon: Martinique, Peter M, MD;  Location: [place] [facility];  Service: Cardiovascular;  Laterality: N/A;   MAZE N/A 06/24/2017   Procedure: MAZE;  Surgeon: Rexene Alberts, MD;  Location: [place];  Service: Open Heart Surgery;  Laterality: N/A;   TEE WITHOUT CARDIOVERSION N/A 06/24/2017   Procedure: TRANSESOPHAGEAL ECHOCARDIOGRAM (TEE);  Surgeon: Rexene Alberts, MD;  Location: [place];  Service: Open Heart Surgery;  Laterality: N/A;   TOE SURGERY Left    4th toe   tophus       Home Meds: Current Meds  Medication Sig   rosuvastatin (CRESTOR) 10 MG  tablet Take 1 tablet (10 mg total) by mouth daily.   [DISCONTINUED] aspirin EC 81 MG EC tablet Take 1 tablet (81 mg total) by mouth daily.    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  A Seery call Peter Martinique all other systems reviewed and negative.    Physical Exam: Blood pressure 108/76, pulse (!) 134, height 6\' 2"  (1.88 m), weight 226 lb 3.2 oz (102.6 kg), SpO2 98 %. General: Well developed, well nourished male in no acute distress. Head: Normocephalic, atraumatic, sclera non-icteric, no xanthomas, nares are without discharge. EENT: normal  Lymph Nodes:  none Neck: Negative for carotid bruits. JVD 8 Back:without scoliosis kyphosis Lungs: Clear bilaterally to auscultation without wheezes, rales, or rhonchi. Breathing is unlabored. Heart: Rapid RR with S1 S2.no murmur . No rubs, or gallops appreciated. Abdomen: Soft, non-tender, non-distended with normoactive bowel sounds. No hepatomegaly. No rebound/guarding. No obvious abdominal masses. Msk:  Strength and tone appear normal for age. Extremities: No clubbing or cyanosis.  2+ edema.  Distal pedal pulses are 2+ and equal bilaterally. Skin: Warm and Dry Neuro: Alert and oriented X 3. CN III-XII intact Grossly normal sensory and motor function . Psych:  Responds to questions appropriately with a normal affect.      Labs: Cardiac Enzymes No results for input(s): CKTOTAL, CKMB, TROPONINI in the last 72 hours. CBC Lab Results  Component Value Date   WBC 8.0 09/22/2018   HGB 15.3 09/22/2018   HCT 47.1 09/22/2018   MCV 90.9 09/22/2018   PLT 234 09/22/2018    PROTIME: No results for input(s): LABPROT, INR in the last 72 hours. Chemistry No results for input(s): NA, K, CL, CO2, BUN, CREATININE, CALCIUM, PROT, BILITOT, ALKPHOS, ALT, AST, GLUCOSE in the last 168 hours.  Invalid input(s): LABALBU Lipids Lab Results  Component Value Date   CHOL 119 10/30/2019   HDL 50 10/30/2019   LDLCALC 49 10/30/2019   TRIG 108 10/30/2019   BNP No results found for: PROBNP Thyroid Function Tests: No results for input(s): TSH, T4TOTAL, T3FREE, THYROIDAB in the last 72 hours.  Invalid input(s): FREET3 Miscellaneous No results found for: DDIMER  Radiology/Studies:  No  results found.  EKG: Tachycardia at 134 Left bundle branch block Intervals-/15/37 Carotid massage demonstrated atrial flutter-atypical with a cycle length of 240 ms   Assessment and Plan:  Atrial flutter-atypical  Congestive heart failure-acute-presumably diastolic  Coronary artery disease with prior bypass/maze/left atrial clipping   The patient has persistent atrial flutter now for about 3 weeks.  He will need anticoagulation in anticipation of cardioversion and thereafter.  Given the congestive status and the very difficult challenge of controlling rate will anticipate TEE guided cardioversion.  Have discussed this with Dr. Shirlee More.  Begin him on apixaban 5 mg twice daily; last creatinine 6/21 was 0.83.  We will check this again.  Begin him on furosemide 40 mg p.o. daily  Will discontinue aspirin  Long-term antiarrhythmic therapy options I think should be informed by the frequency of recurrence, drug therapy, ablation therapy if the frequency is high and perhaps intermittent cardioversion if the frequency remains low.  Last LDL was at range at 49.  That was a year ago we will recheck it today.       Virl Axe

## 2020-11-25 ENCOUNTER — Encounter (HOSPITAL_COMMUNITY): Payer: Self-pay | Admitting: Cardiology

## 2020-11-25 ENCOUNTER — Encounter (HOSPITAL_COMMUNITY)
Admission: RE | Disposition: A | Discharge status: Home / Self Care | Payer: Self-pay | Source: Home / Self Care | Attending: Cardiology

## 2020-11-25 ENCOUNTER — Ambulatory Visit (HOSPITAL_COMMUNITY)
Admission: RE | Admit: 2020-11-25 | Discharge: 2020-11-25 | Disposition: A | Discharge status: Home / Self Care | Payer: Medicare Other | Attending: Cardiology | Admitting: Cardiology

## 2020-11-25 ENCOUNTER — Ambulatory Visit (HOSPITAL_COMMUNITY): Payer: Medicare Other | Admitting: Anesthesiology

## 2020-11-25 ENCOUNTER — Ambulatory Visit (HOSPITAL_BASED_OUTPATIENT_CLINIC_OR_DEPARTMENT_OTHER): Payer: Medicare Other

## 2020-11-25 DIAGNOSIS — R9431 Abnormal electrocardiogram [ECG] [EKG]: Secondary | ICD-10-CM

## 2020-11-25 DIAGNOSIS — Z79899 Other long term (current) drug therapy: Secondary | ICD-10-CM | POA: Insufficient documentation

## 2020-11-25 DIAGNOSIS — R000 Tachycardia, unspecified: Secondary | ICD-10-CM | POA: Diagnosis not present

## 2020-11-25 DIAGNOSIS — E785 Hyperlipidemia, unspecified: Secondary | ICD-10-CM | POA: Diagnosis not present

## 2020-11-25 DIAGNOSIS — I4819 Other persistent atrial fibrillation: Secondary | ICD-10-CM | POA: Insufficient documentation

## 2020-11-25 DIAGNOSIS — Z951 Presence of aortocoronary bypass graft: Secondary | ICD-10-CM | POA: Diagnosis not present

## 2020-11-25 DIAGNOSIS — I484 Atypical atrial flutter: Secondary | ICD-10-CM

## 2020-11-25 DIAGNOSIS — I2510 Atherosclerotic heart disease of native coronary artery without angina pectoris: Secondary | ICD-10-CM | POA: Insufficient documentation

## 2020-11-25 DIAGNOSIS — I700 Atherosclerosis of aorta: Secondary | ICD-10-CM | POA: Insufficient documentation

## 2020-11-25 DIAGNOSIS — I5030 Unspecified diastolic (congestive) heart failure: Secondary | ICD-10-CM | POA: Diagnosis not present

## 2020-11-25 DIAGNOSIS — I4892 Unspecified atrial flutter: Secondary | ICD-10-CM | POA: Insufficient documentation

## 2020-11-25 DIAGNOSIS — I088 Other rheumatic multiple valve diseases: Secondary | ICD-10-CM | POA: Diagnosis not present

## 2020-11-25 DIAGNOSIS — I214 Non-ST elevation (NSTEMI) myocardial infarction: Secondary | ICD-10-CM | POA: Diagnosis not present

## 2020-11-25 DIAGNOSIS — I5031 Acute diastolic (congestive) heart failure: Secondary | ICD-10-CM

## 2020-11-25 HISTORY — PX: TEE WITHOUT CARDIOVERSION: SHX5443

## 2020-11-25 HISTORY — PX: CARDIOVERSION: SHX1299

## 2020-11-25 SURGERY — ECHOCARDIOGRAM, TRANSESOPHAGEAL
Anesthesia: Monitor Anesthesia Care

## 2020-11-25 MED ORDER — BUTAMBEN-TETRACAINE-BENZOCAINE 2-2-14 % EX AERO
INHALATION_SPRAY | CUTANEOUS | Status: DC | PRN
  Administered 2020-11-25: 2 via TOPICAL

## 2020-11-25 MED ORDER — SODIUM CHLORIDE 0.9 % IV SOLN: INTRAVENOUS | Status: DC | PRN

## 2020-11-25 MED ORDER — EPHEDRINE SULFATE 50 MG/ML IJ SOLN
INTRAMUSCULAR | Status: DC | PRN
  Administered 2020-11-25: 10 mg via INTRAVENOUS

## 2020-11-25 MED ORDER — PROPOFOL 500 MG/50ML IV EMUL
INTRAVENOUS | Status: DC | PRN
  Administered 2020-11-25: 125 ug/kg/min via INTRAVENOUS

## 2020-11-25 MED ORDER — PHENYLEPHRINE 40 MCG/ML (10ML) SYRINGE FOR IV PUSH (FOR BLOOD PRESSURE SUPPORT)
PREFILLED_SYRINGE | INTRAVENOUS | Status: DC | PRN
  Administered 2020-11-25: 80 ug via INTRAVENOUS
  Administered 2020-11-25: 120 ug via INTRAVENOUS

## 2020-11-25 MED ORDER — SODIUM CHLORIDE 0.9 % IV SOLN: INTRAVENOUS | Status: DC

## 2020-11-25 NOTE — Anesthesia Postprocedure Evaluation (Signed)
Anesthesia Post Note  Patient: Hector Clark  Procedure(s) Performed: TRANSESOPHAGEAL ECHOCARDIOGRAM (TEE) CARDIOVERSION     Patient location during evaluation: PACU Anesthesia Type: MAC Level of consciousness: awake and alert Pain management: pain level controlled Vital Signs Assessment: post-procedure vital signs reviewed and stable Respiratory status: spontaneous breathing Cardiovascular status: stable Anesthetic complications: no   No notable events documented.  Last Vitals:  Vitals:   11/25/20 0943 11/25/20 0951  BP: (!) 88/56 (!) 91/55  Pulse: 63 63  Resp: 18 19  Temp:    SpO2: 96% 96%    Last Pain:  Vitals:   11/25/20 0943  TempSrc:   PainSc: 0-No pain                 Nolon Nations

## 2020-11-25 NOTE — Interval H&P Note (Signed)
History and Physical Interval Note:  11/25/2020 8:24 AM  Hector Clark  has presented today for surgery, with the diagnosis of AFLUTTER.  The various methods of treatment have been discussed with the patient and family. After consideration of risks, benefits and other options for treatment, the patient has consented to  Procedure(s): TRANSESOPHAGEAL ECHOCARDIOGRAM (TEE) (N/A) CARDIOVERSION (N/A) as a surgical intervention.  The patient's history has been reviewed, patient examined, no change in status, stable for surgery.  I have reviewed the patient's chart and labs.  Questions were answered to the patient's satisfaction.     Keaisha Sublette Harrell Gave

## 2020-11-25 NOTE — Discharge Instructions (Signed)

## 2020-11-25 NOTE — Anesthesia Procedure Notes (Signed)
Procedure Name: MAC Date/Time: 11/25/2020 8:49 AM Performed by: Kathryne Hitch, CRNA Pre-anesthesia Checklist: Patient identified, Emergency Drugs available, Suction available and Patient being monitored Patient Re-evaluated:Patient Re-evaluated prior to induction Oxygen Delivery Method: Nasal cannula Preoxygenation: Pre-oxygenation with 100% oxygen Induction Type: IV induction Placement Confirmation: positive ETCO2 Dental Injury: Teeth and Oropharynx as per pre-operative assessment

## 2020-11-25 NOTE — Progress Notes (Signed)
  Echocardiogram Echocardiogram Transesophageal has been performed.  Bobbye Charleston 11/25/2020, 9:19 AM

## 2020-11-25 NOTE — CV Procedure (Addendum)
   TRANSESOPHAGEAL ECHOCARDIOGRAM GUIDED DIRECT CURRENT CARDIOVERSION  NAME:  Hector Clark   MRN: [id number] DOB:  1946-01-12   ADMIT DATE: 11/25/2020  INDICATIONS: Symptomatic atrial flutter  PROCEDURE:   Informed consent was obtained prior to the procedure. The risks, benefits and alternatives for the procedure were discussed and the patient comprehended these risks.  Risks include, but are not limited to, cough, sore throat, vomiting, nausea, somnolence, esophageal and stomach trauma or perforation, bleeding, low blood pressure, aspiration, pneumonia, infection, trauma to the teeth and death.    After a procedural time-out, the oropharynx was anesthetized and the patient was sedated by the anesthesia service. The transesophageal probe was inserted in the esophagus and stomach without difficulty and multiple views were obtained. Anesthesia was monitored by Dr. Lissa Hoard. Patient received 0 mg IV lidocaine and 267 mg IV propofol.  COMPLICATIONS:    Complications: No complications Patient tolerated procedure well.  FINDINGS:  LEFT VENTRICLE: EF = 35-40% with Global hypokinesis.  RIGHT VENTRICLE: Normal size and function.   LEFT ATRIUM: No thrombus/mass.  LEFT ATRIAL APPENDAGE: No thrombus/mass. S/P LAA clipping, with only small residual LAA. Color flow and doppler signals demonstrate flow in residual LAA.  RIGHT ATRIUM: No thrombus/mass.  AORTIC VALVE:  Trileaflet. No regurgitation. No vegetation.  MITRAL VALVE:    Bowing/trivial prolapse of the posterior mitral valve leaflet at P2. Trivial regurgitation. No vegetation.  TRICUSPID VALVE: Normal structure. Trivial regurgitation. No vegetation.  PULMONIC VALVE: Grossly normal structure. Trivial regurgitation. No apparent vegetation.  INTERATRIAL SEPTUM: No PFO or ASD seen by color Doppler.  PERICARDIUM: No effusion noted.  DESCENDING AORTA: Moderate diffuse plaque seen   CARDIOVERSION:     Indications:  Symptomatic  Atrial Flutter  Procedure Details:  Once the TEE was complete, the patient had the defibrillator pads placed in the anterior and posterior position. Once an appropriate level of sedation was confirmed, the patient was cardioverted x 1 with 120 J of biphasic synchronized energy.  The patient converted to sinus bradycardia. He did have a brief period of returning to an atrial tachycardia in the 90 bpm range and then went back to sinus bradycardia.  There were no apparent complications.  The patient had normal neuro status and respiratory status post procedure with vitals stable as recorded elsewhere.  Adequate airway was maintained throughout and vital signs monitored per protocol.  Buford Dresser, MD, PhD [facility]  [address] [place], [place] [address] [phone]   9:14 AM

## 2020-11-25 NOTE — Transfer of Care (Signed)
Immediate Anesthesia Transfer of Care Note  Patient: Hector Clark  Procedure(s) Performed: TRANSESOPHAGEAL ECHOCARDIOGRAM (TEE) CARDIOVERSION  Patient Location: Endoscopy Unit  Anesthesia Type:MAC  Level of Consciousness: drowsy and patient cooperative  Airway & Oxygen Therapy: Patient Spontanous Breathing and Patient connected to nasal cannula oxygen  Post-op Assessment: Report given to RN and Post -op Vital signs reviewed and stable  Post vital signs: Reviewed and stable  Last Vitals:  Vitals Value Taken Time  BP 87/56 11/25/20 0929  Temp 36.6 C 11/25/20 0925  Pulse 57 11/25/20 0932  Resp 22 11/25/20 0932  SpO2 95 % 11/25/20 0932  Vitals shown include unvalidated device data.  Last Pain:  Vitals:   11/25/20 0925  TempSrc: Oral  PainSc: 0-No pain         Complications: No notable events documented.

## 2020-11-25 NOTE — Anesthesia Preprocedure Evaluation (Addendum)
Anesthesia Evaluation  Patient identified by MRN, date of birth, ID band Patient awake    Reviewed: Allergy & Precautions, NPO status , Patient's Chart, lab work & pertinent test results, reviewed documented beta blocker date and time   Airway Mallampati: III  TM Distance: >3 FB Neck ROM: Full    Dental  (+) Teeth Intact, Chipped, Dental Advisory Given,    Pulmonary neg pulmonary ROS,    Pulmonary exam normal breath sounds clear to auscultation       Cardiovascular hypertension, Pt. on medications and Pt. on home beta blockers + CAD and + Past MI  Normal cardiovascular exam+ dysrhythmias Atrial Fibrillation  Rhythm:Regular Rate:Normal  ECG: SB, rate 43  Echo 12/20/16: Left ventricle: The cavity size was normal. Wall thickness was normal. Systolic function was normal. The estimated ejection fraction was in the range of 50% to 55%. The study is not technically sufficient to allow evaluation of LV diastolic function. Mitral valve: There was mild regurgitation. Atrial septum: No defect or patent foramen ovale was identified.  There was no ST segment deviation noted during stress. Defect 1: There is a small defect of severe severity present in the apex location. This is a low risk study.   Low risk stress nuclear study with apical thinning vs small prior infarct; no significant ischemia; study not gated due to atrial fibrillation.    Neuro/Psych negative neurological ROS  negative psych ROS   GI/Hepatic negative GI ROS,   Endo/Other  Hyperlipidemia Gout  Renal/GU negative Renal ROS     Musculoskeletal   Abdominal   Peds  Hematology HLD   Anesthesia Other Findings   Reproductive/Obstetrics Peyronie's Disease                            Anesthesia Physical Anesthesia Plan  ASA: 3  Anesthesia Plan: MAC   Post-op Pain Management:    Induction: Intravenous  PONV Risk Score and Plan:  Propofol infusion, Treatment may vary due to age or medical condition, Ondansetron and Midazolam  Airway Management Planned: Natural Airway  Additional Equipment: None  Intra-op Plan:   Post-operative Plan:   Informed Consent: I have reviewed the patients History and Physical, chart, labs and discussed the procedure including the risks, benefits and alternatives for the proposed anesthesia with the patient or authorized representative who has indicated his/her understanding and acceptance.     Dental advisory given  Plan Discussed with: CRNA  Anesthesia Plan Comments:         Anesthesia Quick Evaluation

## 2020-11-28 ENCOUNTER — Other Ambulatory Visit: Payer: Self-pay

## 2020-11-28 DIAGNOSIS — I25810 Atherosclerosis of coronary artery bypass graft(s) without angina pectoris: Secondary | ICD-10-CM

## 2020-11-28 DIAGNOSIS — E785 Hyperlipidemia, unspecified: Secondary | ICD-10-CM

## 2020-11-28 NOTE — Progress Notes (Signed)
pid

## 2020-11-30 ENCOUNTER — Other Ambulatory Visit: Payer: Self-pay

## 2020-11-30 ENCOUNTER — Ambulatory Visit (INDEPENDENT_AMBULATORY_CARE_PROVIDER_SITE_OTHER): Payer: Medicare Other | Admitting: Cardiology

## 2020-11-30 ENCOUNTER — Encounter: Payer: Self-pay | Admitting: Cardiology

## 2020-11-30 VITALS — BP 112/70 | HR 64 | Ht 74.0 in | Wt 219.0 lb

## 2020-11-30 DIAGNOSIS — I25810 Atherosclerosis of coronary artery bypass graft(s) without angina pectoris: Secondary | ICD-10-CM

## 2020-11-30 DIAGNOSIS — I484 Atypical atrial flutter: Secondary | ICD-10-CM

## 2020-11-30 DIAGNOSIS — E785 Hyperlipidemia, unspecified: Secondary | ICD-10-CM | POA: Diagnosis not present

## 2020-11-30 DIAGNOSIS — I10 Essential (primary) hypertension: Secondary | ICD-10-CM | POA: Diagnosis not present

## 2020-11-30 DIAGNOSIS — I5031 Acute diastolic (congestive) heart failure: Secondary | ICD-10-CM | POA: Diagnosis not present

## 2020-11-30 LAB — BASIC METABOLIC PANEL
BUN/Creatinine Ratio: 17 (ref 10–24)
BUN: 18 mg/dL (ref 8–27)
CO2: 25 mmol/L (ref 20–29)
Calcium: 9.8 mg/dL (ref 8.6–10.2)
Chloride: 98 mmol/L (ref 96–106)
Creatinine, Ser: 1.06 mg/dL (ref 0.76–1.27)
Glucose: 103 mg/dL — ABNORMAL HIGH (ref 65–99)
Potassium: 5.3 mmol/L — ABNORMAL HIGH (ref 3.5–5.2)
Sodium: 138 mmol/L (ref 134–144)
eGFR: 74 mL/min/{1.73_m2} (ref >59)

## 2020-11-30 NOTE — Progress Notes (Signed)
Cardiology Office Note   Date:  11/30/2020   ID:  Hector Clark, DOB Feb 21, 1946, MRN [id number]  PCP:  Lavone Orn, MD  Cardiologist:   Erdine Hulen Martinique, MD   Chief Complaint  Patient presents with   Atrial Flutter   Coronary Artery Disease       History of Present Illness: Hector Clark is a 75 y.o. male with a past medical history of CAD s/p CABG in 06/2017, persistent atrial fibrillation s/p MAZE procedure and LAA clip, prediabetes, and hyperlipidemia.  Holter monitor obtained in September 2018 showed atrial fibrillation with episodes of PVCs and nonsustained ventricular tachycardia versus aberrancy.  He had previous DCCV in November 2018 and underwent successful cardioversion.  TEE obtained in February 2019 showed EF 35 to 40%, trace MR and TR. In February 2019 he presented with STEMI with ST elevation in the inferior leads. He was taken to the cath lab emergently and had critical left main stenosis and occluded LCx. He then underwent emergent CABG with Dr Roxy Manns. This included a LIMA to the LAD and SVG to OM. He had MAZE and LA clipping.  His Tikosyn was discontinued in June 2019.    He has not had any recurrence of atrial fibrillation following his MAZE procedure. Repeat Echo in June 2019 showed improvement in EF to 50-55%.  He presented to the ED on 09/22/2018 with back pain.  CT was negative for acute dissection or large PE.  Troponin was negative.    He was seen by Dr [name] in early July because of persistent tachycardia going  back to June 17 according to his Fitbit. He states his HR would be 135 then drop into the 40s at times. This was associated with dyspnea on exertion and nocturnal dyspnea. he was found to be in Atrial flutter with rate 134. He was started on Eliquis and ASA was discontinued. He was started on lasix. He underwent TEE guided DCCV on 11/25/20. TEE showed EF 35-40%. No thrombus. Trivial MR. His lipids were checked and LDL was not at goal so statin dose was  increased.   Since his DCCV he notes significant improvement in dyspnea, orthopnea and edema. Pulse has been regular on Fitbit. He denies any angina. Reports before he was only taking Crestor about half the time.    Past Medical History:  Diagnosis Date   Chest pain    with nagative cardiolite   Gout    occational flare   Hyperlipemia    Persistent atrial fibrillation (HCC)    Peyronie disease    S/P CABG x 2 06/24/2017   LIMA to LAD and SVG to OM with [facility] via right thigh   S/P Maze operation for atrial fibrillation 06/24/2017   Left atrial lesion set using bipolar radiofrequency and cryothermy ablation via conventional median sternotomy with clipping of LA appendage   Visit for monitoring Tikosyn therapy 06/18/2017    Past Surgical History:  Procedure Laterality Date   bone graph     CARDIOVERSION N/A 03/25/2017   Procedure: CARDIOVERSION;  Surgeon: Sueanne Margarita, MD;  Location: [place];  Service: Cardiovascular;  Laterality: N/A;   CARDIOVERSION N/A 11/25/2020   Procedure: CARDIOVERSION;  Surgeon: Buford Dresser, MD;  Location: [place];  Service: Cardiovascular;  Laterality: N/A;   CLIPPING OF ATRIAL APPENDAGE N/A 06/24/2017   Procedure: CLIPPING OF ATRIAL APPENDAGE;  Surgeon: Rexene Alberts, MD;  Location: [place];  Service: Open Heart Surgery;  Laterality: N/A;   CORONARY ARTERY BYPASS GRAFT  N/A 06/24/2017   Procedure: CORONARY ARTERY BYPASS GRAFTING (CABG) times two utilizing left anterior mammary artery and right greater saphenous vein harvested endoscopically;  Surgeon: Rexene Alberts, MD;  Location: [place];  Service: Open Heart Surgery;  Laterality: N/A;   IABP INSERTION N/A 06/24/2017   Procedure: IABP Insertion;  Surgeon: Clark, Dymond Spreen M, MD;  Location: [place] [facility];  Service: Cardiovascular;  Laterality: N/A;   LEFT HEART CATH AND CORONARY ANGIOGRAPHY N/A 06/24/2017   Procedure: LEFT HEART CATH AND CORONARY ANGIOGRAPHY;  Surgeon: Clark, Kyani Simkin M, MD;   Location: [place] [facility];  Service: Cardiovascular;  Laterality: N/A;   MAZE N/A 06/24/2017   Procedure: MAZE;  Surgeon: Rexene Alberts, MD;  Location: [place];  Service: Open Heart Surgery;  Laterality: N/A;   TEE WITHOUT CARDIOVERSION N/A 06/24/2017   Procedure: TRANSESOPHAGEAL ECHOCARDIOGRAM (TEE);  Surgeon: Rexene Alberts, MD;  Location: [place];  Service: Open Heart Surgery;  Laterality: N/A;   TEE WITHOUT CARDIOVERSION N/A 11/25/2020   Procedure: TRANSESOPHAGEAL ECHOCARDIOGRAM (TEE);  Surgeon: Buford Dresser, MD;  Location: [facility] ENDOSCOPY;  Service: Cardiovascular;  Laterality: N/A;   TOE SURGERY Left    4th toe   tophus       Current Outpatient Medications  Medication Sig Dispense Refill   apixaban (ELIQUIS) 5 MG TABS tablet Take 1 tablet (5 mg total) by mouth 2 (two) times daily. 60 tablet 3   cholecalciferol (VITAMIN D) 25 MCG (1000 UNIT) tablet Take 1,000 Units by mouth daily.     furosemide (LASIX) 40 MG tablet Take 1 tablet (40 mg total) by mouth daily. 90 tablet 3   magnesium gluconate (MAGONATE) 500 MG tablet Take 500 mg by mouth at bedtime.     metoprolol tartrate (LOPRESSOR) 25 MG tablet Take 1 tablet (25 mg total) by mouth 2 (two) times daily. 180 tablet 3   Multiple Vitamins-Minerals (ZINC PO) Take 250 mg by mouth daily.     rosuvastatin (CRESTOR) 10 MG tablet Take 1 tablet (10 mg total) by mouth daily. 90 tablet 1   vitamin C (ASCORBIC ACID) 500 MG tablet Take 500 mg by mouth daily.     No current facility-administered medications for this visit.    Allergies:   Patient has no known allergies.    Social History:  The patient  reports that he has never smoked. He has never used smokeless tobacco. He reports current alcohol use. He reports that he does not use drugs.   Family History:  The patient's family history includes Esophageal cancer (age of onset: 16) in his father; Healthy in his daughter; Liver cancer (age of onset: 109) in his mother.    ROS:  Please  see the history of present illness.   Otherwise, review of systems are positive for none.   All other systems are reviewed and negative.    PHYSICAL EXAM: VS:  BP 112/70   Pulse 64   Ht 6\' 2"  (1.88 m)   Wt 219 lb (99.3 kg)   SpO2 98%   BMI 28.12 kg/m  , BMI Body mass index is 28.12 kg/m. GEN: Well nourished, well developed, in no acute distress  HEENT: normal  Neck: no JVD, carotid bruits, or masses Cardiac: RRR; no murmurs, rubs, or gallops,no edema  Respiratory:  clear to auscultation bilaterally, normal work of breathing GI: soft, nontender, nondistended, + BS MS: no deformity or atrophy  Skin: warm and dry, no rash Neuro:  Strength and sensation are intact Psych:  euthymic mood, full affect   EKG:  EKG is ordered today. The ekg ordered today demonstrates NSR rate 62 Occ. PVCs. T wave inversion c/w anterior ischemia. LBBB noted with atrial flutter resolved but T wave inversion is new from old Ecgs. I have personally reviewed and interpreted this study.    Recent Labs: 11/23/2020: BUN 13; Creatinine, Ser 1.04; Hemoglobin 15.6; Platelets 312; Potassium 5.3; Sodium 139    Lipid Panel    Component Value Date/Time   CHOL 151 11/23/2020 0904   TRIG 83 11/23/2020 0904   HDL 43 11/23/2020 0904   CHOLHDL 3.5 11/23/2020 0904   LDLCALC 92 11/23/2020 0904      Wt Readings from Last 3 Encounters:  11/30/20 219 lb (99.3 kg)  11/25/20 226 lb 3.2 oz (102.6 kg)  11/23/20 226 lb 3.2 oz (102.6 kg)      Other studies Reviewed: Additional studies/ records that were reviewed today include:   CABG x 2 on 06/24/2017   Procedure:        Coronary Artery Bypass Grafting x 2              Left Internal Mammary Artery to Distal Left Anterior Descending Coronary Artery             Saphenous Vein Graft to Obtuse Marginal Branch of Left Circumflex Coronary Artery             Endoscopic Vein Harvest from Right Thigh    Maze Procedure              Left atrial lesion set using bipolar  radiofrequency and cryothermy ablation             Clipping of left atrial appendage (Atriclip size 44mm)   Echo 11/06/17: Study Conclusions   - Left ventricle: The cavity size was normal. Wall thickness was   increased in a pattern of mild LVH. Systolic function was normal.   The estimated ejection fraction was in the range of 50% to 55%.   Diffuse hypokinesis. There was no evidence of elevated   ventricular filling pressure by Doppler parameters. - Aortic valve: Trileaflet; mildly thickened, mildly calcified   leaflets.  TEE 11/25/20: IMPRESSIONS     1. Left ventricular ejection fraction, by estimation, is 35 to 40%. The  left ventricle has moderately decreased function. The left ventricle  demonstrates global hypokinesis.   2. Right ventricular systolic function is normal. The right ventricular  size is normal.   3. S/P prior LAA clip, with very small residual ostium of LAA. No  thrombus noted.. No left atrial/left atrial appendage thrombus was  detected.   4. Bowing/trivial prolapse of the posterior mitral valve leaflet at P2.  The mitral valve is abnormal. Trivial mitral valve regurgitation.   5. The aortic valve is tricuspid. Aortic valve regurgitation is not  visualized. No aortic stenosis is present.   6. There is Moderate (Grade III) plaque involving the descending aorta.   Conclusion(s)/Recommendation(s): Normal biventricular function without  evidence of hemodynamically significant valvular heart disease. No LA/LAA  thrombus identified. Successful cardioversion performed with restoration  of normal sinus rhythm.   ASSESSMENT AND PLAN:  1.  CAD s/p CABG in Feb 2019: Continue aspirin.  He is asymptomatic but recent drop in EF is concerning. Recommend lexiscan myoview to assess perfusion.   Hyperlipidemia: LDL recently elevated. On Crestor but was not taking regularly. Will resume and follow up in 3 months. History of intolerance to lipitor.    Prediabetes: Recent  hemoglobin A1c was increased to 6.8%.  discussed lifestyle modification with weight loss and carbohydrate restricted diet. needs to follow up with Dr Laurann Montana.      4. Persistent atrial fibrillation s/p Maze procedure and LAA clip in Feb 2019. Recurrent atrial flutter recently. Now s/p TEE guided DCCV on July 8. If he has frequent recurrence will need to consider AAD therapy ( was on Tikosyn prior ) or ablation.     5. Chronic systolic CHF. Post revascularization EF had improved to 50-55%. On recent TEE it was down to  35-40%. This may be tachycardia mediated since he was in rapid Aflutter for 3 weeks or so prior to DCCV. Also had a LBBB with tachycardia that has resolved. Currently on metoprolol and lasix. Will check BMET today. If OK will plan on starting Entresto. Will have follow up with pharm D to optimize treatment. Once therapy optimized plan on repeating Echo in 3-4 months.    Current medicines are reviewed at length with the patient today.  The patient does not have concerns regarding medicines.  The following changes have been made:  no change  Labs/ tests ordered today include:   Orders Placed This Encounter  Procedures   Cardiac Stress Test: Informed Consent Details: Physician/Practitioner Attestation; Transcribe to consent form and obtain patient signature   EKG 12-Lead      Disposition:   FU with me 3-4 months  Signed, Hector Dexter Martinique, MD  11/30/2020 9:49 AM    [facility] [address], [place], [place], [address] Phone [phone], Fax [phone]

## 2020-11-30 NOTE — Patient Instructions (Signed)
Medication Instructions:   Continue same medications *If you need a refill on your cardiac medications before your next appointment, please call your pharmacy*   Lab Work:  Bmet today   Testing/Procedures:  Schedule Lexiscan   Follow-Up: At [facility], you and your health needs are our priority.  As part of our continuing mission to provide you with exceptional heart care, we have created designated Provider Care Teams.  These Care Teams include your primary Cardiologist (physician) and Advanced Practice Providers (APPs -  Physician Assistants and Nurse Practitioners) who all work together to provide you with the care you need, when you need it.  We recommend signing up for the patient portal called "MyChart".  Sign up information is provided on this After Visit Summary.  MyChart is used to connect with patients for Virtual Visits (Telemedicine).  Patients are able to view lab/test results, encounter notes, upcoming appointments, etc.  Non-urgent messages can be sent to your provider as well.   To learn more about what you can do with MyChart, go to [email or web address].    Your next appointment:  Monday 10/10 at 4:30 pm   The format for your next appointment: Office    Provider:  Dr.Jordan  Schedule appointment with Pharmacist in 2 to 3 weeks to titrate medication

## 2020-12-01 ENCOUNTER — Other Ambulatory Visit: Payer: Self-pay

## 2020-12-01 ENCOUNTER — Telehealth: Payer: Self-pay | Admitting: Cardiology

## 2020-12-01 ENCOUNTER — Telehealth: Payer: Self-pay

## 2020-12-01 DIAGNOSIS — Z79899 Other long term (current) drug therapy: Secondary | ICD-10-CM

## 2020-12-01 DIAGNOSIS — I5031 Acute diastolic (congestive) heart failure: Secondary | ICD-10-CM

## 2020-12-01 MED ORDER — ENTRESTO 24-26 MG PO TABS
1.0000 | ORAL_TABLET | Freq: Two times a day (BID) | ORAL | 3 refills | Status: DC
Start: 2020-12-01 — End: 2020-12-01

## 2020-12-01 MED ORDER — ENTRESTO 24-26 MG PO TABS: 1.0000 | ORAL_TABLET | Freq: Two times a day (BID) | ORAL | 3 refills | Status: DC

## 2020-12-01 NOTE — Telephone Encounter (Signed)
**Note De-Identified Diya Gervasi Obfuscation** We received an Eliquis PA form from [place]. I have completed the PA form and have emailed it to Dr Olin Pia nurse so she can obtain his signature, date it, and to fax back to [facility] at the fax number written on the cover letter included or to place in the to be faxed box in Medical Records to be faxed.

## 2020-12-01 NOTE — Progress Notes (Signed)
entresto

## 2020-12-01 NOTE — Telephone Encounter (Signed)
Patient calling into request 30 day supply of Entresto vs 90 day supply.   Advised patient that 30 day supply has been sent in to patient preferred pharmacy and advised patient that coupon card for first 30 days free of Entresto has been left at front desk for him to pick up as well.   Advised patient to call back to office with any issues, questions, or concerns. Patient verbalized understanding.

## 2020-12-01 NOTE — Telephone Encounter (Signed)
Pt is calling requesting to speak with the nurse in regards to his medication

## 2020-12-02 ENCOUNTER — Telehealth (HOSPITAL_COMMUNITY): Payer: Self-pay | Admitting: *Deleted

## 2020-12-02 NOTE — Telephone Encounter (Signed)
Close encounter 

## 2020-12-06 ENCOUNTER — Other Ambulatory Visit: Payer: Self-pay

## 2020-12-06 ENCOUNTER — Ambulatory Visit (HOSPITAL_COMMUNITY)
Admission: RE | Admit: 2020-12-06 | Discharge: 2020-12-06 | Disposition: A | Discharge status: Home / Self Care | Payer: Medicare Other | Source: Ambulatory Visit | Attending: Internal Medicine | Admitting: Internal Medicine

## 2020-12-06 DIAGNOSIS — I10 Essential (primary) hypertension: Secondary | ICD-10-CM | POA: Diagnosis not present

## 2020-12-06 DIAGNOSIS — I25810 Atherosclerosis of coronary artery bypass graft(s) without angina pectoris: Secondary | ICD-10-CM | POA: Insufficient documentation

## 2020-12-06 DIAGNOSIS — I5031 Acute diastolic (congestive) heart failure: Secondary | ICD-10-CM | POA: Diagnosis not present

## 2020-12-06 DIAGNOSIS — I484 Atypical atrial flutter: Secondary | ICD-10-CM | POA: Diagnosis not present

## 2020-12-06 DIAGNOSIS — E785 Hyperlipidemia, unspecified: Secondary | ICD-10-CM | POA: Diagnosis not present

## 2020-12-06 LAB — MYOCARDIAL PERFUSION IMAGING
LV dias vol: 152 mL (ref 62–150)
LV sys vol: 90 mL
Peak HR: 69 {beats}/min
Rest HR: 64 {beats}/min
SDS: 1
SRS: 2
SSS: 3
TID: 1.08

## 2020-12-06 MED ORDER — TECHNETIUM TC 99M TETROFOSMIN IV KIT
31.2000 | PACK | Freq: Once | INTRAVENOUS | Status: AC | PRN
  Administered 2020-12-06: 31.2 via INTRAVENOUS
  Filled 2020-12-06: qty 32

## 2020-12-06 MED ORDER — REGADENOSON 0.4 MG/5ML IV SOLN
0.4000 mg | Freq: Once | INTRAVENOUS | Status: AC
  Administered 2020-12-06: 0.4 mg via INTRAVENOUS

## 2020-12-06 MED ORDER — TECHNETIUM TC 99M TETROFOSMIN IV KIT
10.5000 | PACK | Freq: Once | INTRAVENOUS | Status: AC | PRN
  Administered 2020-12-06: 10.5 via INTRAVENOUS
  Filled 2020-12-06: qty 11

## 2020-12-07 NOTE — Telephone Encounter (Signed)
PA printed and placed with medical records for faxing.

## 2020-12-09 ENCOUNTER — Other Ambulatory Visit: Payer: Self-pay

## 2020-12-09 ENCOUNTER — Encounter (HOSPITAL_COMMUNITY): Payer: Self-pay | Admitting: Physician Assistant

## 2020-12-09 ENCOUNTER — Ambulatory Visit (HOSPITAL_COMMUNITY)
Admission: RE | Admit: 2020-12-09 | Discharge: 2020-12-09 | Disposition: A | Discharge status: Home / Self Care | Payer: Medicare Other | Source: Ambulatory Visit | Attending: Physician Assistant | Admitting: Physician Assistant

## 2020-12-09 VITALS — BP 92/64 | HR 70 | Ht 74.0 in | Wt 215.6 lb

## 2020-12-09 DIAGNOSIS — R7303 Prediabetes: Secondary | ICD-10-CM | POA: Diagnosis not present

## 2020-12-09 DIAGNOSIS — D6869 Other thrombophilia: Secondary | ICD-10-CM | POA: Insufficient documentation

## 2020-12-09 DIAGNOSIS — I5022 Chronic systolic (congestive) heart failure: Secondary | ICD-10-CM | POA: Diagnosis not present

## 2020-12-09 DIAGNOSIS — E785 Hyperlipidemia, unspecified: Secondary | ICD-10-CM | POA: Insufficient documentation

## 2020-12-09 DIAGNOSIS — Z79899 Other long term (current) drug therapy: Secondary | ICD-10-CM | POA: Insufficient documentation

## 2020-12-09 DIAGNOSIS — I4819 Other persistent atrial fibrillation: Secondary | ICD-10-CM | POA: Diagnosis not present

## 2020-12-09 DIAGNOSIS — I2510 Atherosclerotic heart disease of native coronary artery without angina pectoris: Secondary | ICD-10-CM | POA: Insufficient documentation

## 2020-12-09 DIAGNOSIS — I484 Atypical atrial flutter: Secondary | ICD-10-CM | POA: Insufficient documentation

## 2020-12-09 DIAGNOSIS — Z7901 Long term (current) use of anticoagulants: Secondary | ICD-10-CM | POA: Diagnosis not present

## 2020-12-09 DIAGNOSIS — Z951 Presence of aortocoronary bypass graft: Secondary | ICD-10-CM | POA: Diagnosis not present

## 2020-12-09 NOTE — Progress Notes (Signed)
Primary Care Physician: Lavone Orn, MD Primary Cardiologist: Dr Martinique Primary Electrophysiologist: Dr [name] Referring Physician: Dr Alphia Moh Hector Clark is a 75 y.o. male with a history of CAD s/p CABG in [id number], chronic systolic CHF, persistent atrial fibrillation s/p MAZE procedure and LAA clip, atrial flutter, prediabetes, and hyperlipidemia who presents for follow up in the [facility]. Patient is on Eliquis for a CHADS2VASC score of 3. He had done well post MAZE with no known episodes of afib until he noted an elevated heart rate on his FitBit on 11/04/20. ECG confirmed atrial flutter with rapid rates. He underwent TEE guided DCCV on 11/25/20 which showed reduced EF 35-40%. He feels well today in SR. He denies any bleeding issues on anticoagulation.   Today, he denies symptoms of palpitations, chest pain, shortness of breath, orthopnea, PND, lower extremity edema, dizziness, presyncope, syncope, snoring, daytime somnolence, bleeding, or neurologic sequela. The patient is tolerating medications without difficulties and is otherwise without complaint today.    Atrial Fibrillation Risk Factors:  he does not have symptoms or diagnosis of sleep apnea. he does not have a history of rheumatic fever.   he has a BMI of Body mass index is 27.68 kg/m.Marland Kitchen Filed Weights   12/09/20 0830  Weight: 97.8 kg    Family History  Problem Relation Age of Onset   Liver cancer Mother 49   Esophageal cancer Father 72   Healthy Daughter    Heart disease Neg Hx      Atrial Fibrillation Management history:  Previous antiarrhythmic drugs: flecainide, dofetilide  Previous cardioversions: 03/2017, 11/25/20 Previous ablations: MAZE CHADS2VASC score: 3 Anticoagulation history: Eliquis   Past Medical History:  Diagnosis Date   Chest pain    with nagative cardiolite   Gout    occational flare   Hyperlipemia    Persistent atrial fibrillation (HCC)    Peyronie  disease    S/P CABG x 2 06/24/2017   LIMA to LAD and SVG to OM with [facility] via [facility] thigh   S/P Maze operation for atrial fibrillation 06/24/2017   Left atrial lesion set using bipolar radiofrequency and cryothermy ablation via conventional median sternotomy with clipping of LA appendage   Visit for monitoring Tikosyn therapy 06/18/2017   Past Surgical History:  Procedure Laterality Date   bone graph     CARDIOVERSION N/A 03/25/2017   Procedure: CARDIOVERSION;  Surgeon: Sueanne Margarita, MD;  Location: [place];  Service: Cardiovascular;  Laterality: N/A;   CARDIOVERSION N/A 11/25/2020   Procedure: CARDIOVERSION;  Surgeon: Buford Dresser, MD;  Location: [place];  Service: Cardiovascular;  Laterality: N/A;   CLIPPING OF ATRIAL APPENDAGE N/A 06/24/2017   Procedure: CLIPPING OF ATRIAL APPENDAGE;  Surgeon: Rexene Alberts, MD;  Location: [place];  Service: Open Heart Surgery;  Laterality: N/A;   CORONARY ARTERY BYPASS GRAFT N/A 06/24/2017   Procedure: CORONARY ARTERY BYPASS GRAFTING (CABG) times two utilizing left anterior mammary artery and right greater saphenous vein harvested endoscopically;  Surgeon: Rexene Alberts, MD;  Location: [place];  Service: Open Heart Surgery;  Laterality: N/A;   IABP INSERTION N/A 06/24/2017   Procedure: IABP Insertion;  Surgeon: Martinique, Peter M, MD;  Location: [place] [facility];  Service: Cardiovascular;  Laterality: N/A;   LEFT HEART CATH AND CORONARY ANGIOGRAPHY N/A 06/24/2017   Procedure: LEFT HEART CATH AND CORONARY ANGIOGRAPHY;  Surgeon: Martinique, Peter M, MD;  Location: [place] [facility];  Service: Cardiovascular;  Laterality: N/A;  MAZE N/A 06/24/2017   Procedure: MAZE;  Surgeon: Rexene Alberts, MD;  Location: [place];  Service: Open Heart Surgery;  Laterality: N/A;   TEE WITHOUT CARDIOVERSION N/A 06/24/2017   Procedure: TRANSESOPHAGEAL ECHOCARDIOGRAM (TEE);  Surgeon: Rexene Alberts, MD;  Location: [place];  Service: Open Heart Surgery;  Laterality: N/A;   TEE  WITHOUT CARDIOVERSION N/A 11/25/2020   Procedure: TRANSESOPHAGEAL ECHOCARDIOGRAM (TEE);  Surgeon: Buford Dresser, MD;  Location: [facility] ENDOSCOPY;  Service: Cardiovascular;  Laterality: N/A;   TOE SURGERY Left    4th toe   tophus      Current Outpatient Medications  Medication Sig Dispense Refill   apixaban (ELIQUIS) 5 MG TABS tablet Take 1 tablet (5 mg total) by mouth 2 (two) times daily. 60 tablet 3   furosemide (LASIX) 40 MG tablet Take 1 tablet (40 mg total) by mouth daily. 90 tablet 3   metoprolol tartrate (LOPRESSOR) 25 MG tablet Take 1 tablet (25 mg total) by mouth 2 (two) times daily. 180 tablet 3   rosuvastatin (CRESTOR) 10 MG tablet Take 1 tablet (10 mg total) by mouth daily. 90 tablet 1   sacubitril-valsartan (ENTRESTO) 24-26 MG Take 1 tablet by mouth 2 (two) times daily. 60 tablet 3   cholecalciferol (VITAMIN D) 25 MCG (1000 UNIT) tablet Take 1,000 Units by mouth daily. (Patient not taking: Reported on 12/09/2020)     magnesium gluconate (MAGONATE) 500 MG tablet Take 500 mg by mouth at bedtime. (Patient not taking: Reported on 12/09/2020)     Multiple Vitamins-Minerals (ZINC PO) Take 250 mg by mouth daily. (Patient not taking: Reported on 12/09/2020)     vitamin C (ASCORBIC ACID) 500 MG tablet Take 500 mg by mouth daily. (Patient not taking: Reported on 12/09/2020)     No current facility-administered medications for this encounter.    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  Physical Exam: Vitals:   12/09/20 0830  BP: 92/64  Pulse: 70  Weight: 97.8 kg  Height: '6\' 2"'$  (1.88 m)    GEN- The patient is a well appearing male, alert and oriented x 3 today.   Head- normocephalic, atraumatic Eyes-  Sclera clear, conjunctiva pink Ears- hearing intact Oropharynx- clear Neck- supple  Lungs- Clear to ausculation bilaterally, normal work of breathing Heart- Regular rate and rhythm, no murmurs, rubs or gallops  GI- soft,  NT, ND, + BS Extremities- no clubbing, cyanosis, or edema MS- no significant deformity or atrophy Skin- no rash or lesion Psych- euthymic mood, full affect Neuro- strength and sensation are intact  Wt Readings from Last 3 Encounters:  12/09/20 97.8 kg  12/06/20 99.3 kg  11/30/20 99.3 kg    EKG today demonstrates  SR, LBBB, PAC Vent. rate 70 BPM PR interval 204 ms QRS duration 156 ms QT/QTcB 444/479 ms  TEE 11/25/20 demonstrated   1. Left ventricular ejection fraction, by estimation, is 35 to 40%. The  left ventricle has moderately decreased function. The left ventricle  demonstrates global hypokinesis.   2. Right ventricular systolic function is normal. The right ventricular  size is normal.   3. S/P prior LAA clip, with very small residual ostium of LAA. No  thrombus noted.. No left atrial/left atrial appendage thrombus was  detected.   4. Bowing/trivial prolapse of the posterior mitral valve leaflet at P2.  The mitral valve is abnormal. Trivial mitral valve regurgitation.   5. The aortic valve is tricuspid. Aortic  valve regurgitation is not  visualized. No aortic stenosis is present.   6. There is Moderate (Grade III) plaque involving the descending aorta.   Conclusion(s)/Recommendation(s): Normal biventricular function without  evidence of hemodynamically significant valvular heart disease. No LA/LAA  thrombus identified. Successful cardioversion performed with restoration  of normal sinus rhythm.   Epic records are reviewed at length today  CHA2DS2-VASc Score = 3  The patient's score is based upon: CHF History: Yes HTN History: No Diabetes History: No (prediabetes) Stroke History: No Vascular Disease History: Yes Age Score: 1 Gender Score: 0     ASSESSMENT AND PLAN: 1. Persistent atrial fibrillation/Atypical atrial flutter  The patient's CHA2DS2-VASc score is 3, indicating a 3.2% annual risk of stroke.   S/p TEE/DCCV 11/25/20 Patient appears to be maintaining SR. Continue Lopressor 25 mg BID Continue Eliquis 5 mg BID  2. Secondary Hypercoagulable State (ICD10:  D68.69) The patient is at significant risk for stroke/thromboembolism based upon his CHA2DS2-VASc Score of 3.  Continue Apixaban (Eliquis).   3. CAD S/p CABG No anginal symptoms.  4. Chronic systolic CHF 123456 on last echo, possibly related to #1 No signs or symptoms of fluid overload.    Follow up with Dr Martinique as scheduled.    [facility] [address] [place], [place] [address] [phone] 12/09/2020 8:36 AM

## 2020-12-12 NOTE — Telephone Encounter (Signed)
Noted, will await new update.

## 2020-12-13 DIAGNOSIS — E785 Hyperlipidemia, unspecified: Secondary | ICD-10-CM | POA: Diagnosis not present

## 2020-12-13 DIAGNOSIS — I5031 Acute diastolic (congestive) heart failure: Secondary | ICD-10-CM | POA: Diagnosis not present

## 2020-12-13 DIAGNOSIS — I10 Essential (primary) hypertension: Secondary | ICD-10-CM | POA: Diagnosis not present

## 2020-12-13 DIAGNOSIS — I25810 Atherosclerosis of coronary artery bypass graft(s) without angina pectoris: Secondary | ICD-10-CM | POA: Diagnosis not present

## 2020-12-13 DIAGNOSIS — I484 Atypical atrial flutter: Secondary | ICD-10-CM | POA: Diagnosis not present

## 2020-12-13 DIAGNOSIS — Z79899 Other long term (current) drug therapy: Secondary | ICD-10-CM | POA: Diagnosis not present

## 2020-12-13 LAB — HEPATIC FUNCTION PANEL
ALT: 17 IU/L (ref 0–44)
AST: 12 IU/L (ref 0–40)
Albumin: 4.2 g/dL (ref 3.7–4.7)
Alkaline Phosphatase: 140 IU/L — ABNORMAL HIGH (ref 44–121)
Bilirubin Total: 0.6 mg/dL (ref 0.0–1.2)
Bilirubin, Direct: 0.21 mg/dL (ref 0.00–0.40)
Total Protein: 7.2 g/dL (ref 6.0–8.5)

## 2020-12-13 LAB — LIPID PANEL
Chol/HDL Ratio: 2.8 ratio (ref 0.0–5.0)
Cholesterol, Total: 115 mg/dL (ref 100–199)
HDL: 41 mg/dL (ref >39)
LDL Chol Calc (NIH): 52 mg/dL (ref 0–99)
Triglycerides: 125 mg/dL (ref 0–149)
VLDL Cholesterol Cal: 22 mg/dL (ref 5–40)

## 2020-12-13 LAB — BASIC METABOLIC PANEL
BUN/Creatinine Ratio: 22 (ref 10–24)
BUN: 22 mg/dL (ref 8–27)
CO2: 19 mmol/L — ABNORMAL LOW (ref 20–29)
Calcium: 9.4 mg/dL (ref 8.6–10.2)
Chloride: 101 mmol/L (ref 96–106)
Creatinine, Ser: 1.01 mg/dL (ref 0.76–1.27)
Glucose: 128 mg/dL — ABNORMAL HIGH (ref 65–99)
Potassium: 4.6 mmol/L (ref 3.5–5.2)
Sodium: 138 mmol/L (ref 134–144)
eGFR: 79 mL/min/{1.73_m2} (ref >59)

## 2020-12-21 ENCOUNTER — Other Ambulatory Visit: Payer: Self-pay

## 2020-12-21 ENCOUNTER — Ambulatory Visit (INDEPENDENT_AMBULATORY_CARE_PROVIDER_SITE_OTHER): Payer: Medicare Other | Admitting: Pharmacist

## 2020-12-21 VITALS — BP 108/58 | HR 57 | Wt 222.0 lb

## 2020-12-21 DIAGNOSIS — I255 Ischemic cardiomyopathy: Secondary | ICD-10-CM

## 2020-12-21 DIAGNOSIS — I5031 Acute diastolic (congestive) heart failure: Secondary | ICD-10-CM | POA: Diagnosis not present

## 2020-12-21 NOTE — Patient Instructions (Addendum)
It was nice meeting you today  We would like your blood pressure to stay less than 130/80  Please watch your salt content.  Try to eat less than '1500mg'$  a day  Weigh yourself every morning.  Please call us if you gain more than 3 pounds overnight or 5 pounds in a week.  Let us know if you experience any chest pain or shortness of breath  The next step in your treatment would be [place] or [place].  They help with your heart and blood sugar.  Continue your Entresto 24/26 twice a day Continue your metoprolol '25mg'$  twice a day Continue your furosemide '40mg'$  once a day  Medications to look into for Medicare enrollment just in case:  Cardiac: Entresto Jardiance/Farxiga Eliquis/Xarelto/Savaysa Brilinta  Cholesterol: Repatha/Praluent  Please call with any questions! Karren Cobble, PharmD, BCACP, [place], [place] [id number], [place], [place] [address] Phone: [phone]; Fax: [phone] 12/21/2020 12:12 PM

## 2020-12-21 NOTE — Progress Notes (Signed)
Patient ID: Hector Clark                 DOB: 1945-07-10                      MRN: [id number]     HPI: Hector Clark is a 75 y.o. male referred by [name] to HTN clinic. PMH is significant for NSTEMI, CHF, a fib, HTN, s/p MAZE operation, s/p CABG, and HLD.  Of note, A1c 6.8 on 10/30/19.  Most recent EF 35-40%.  Patient was having recurrent issues with tachycardia and brady cardia. Dr [name] started patient on metoprolol tartrate 62m BID  Today she returns to pharmacy clinic for further medication titration. At last visit with Dr. [name] EDelene Loll24-26 BID was started.  Symptomatically, she is feeling well, denies dizziness, lightheadedness, and fatigue. Feels no chest pain or palpitations. Able to complete all ADLs.   Activity level normal.  Has lower extremity edema especially in right leg.  Knee was also swollen until furosemide was started.  Appetite has been good.  Patient does not adhere to a low salt diet.  Reports his wife told him he is the only person shes met that puts salt on pizza.  Patient was extremely physically active in his younger years.  Frequently ran marathons, Iron man marathons, and 5Ks.    Has questions regarding Viagra which was dispensed by PCP.  Patient unsure of dose.   Patient very concerned regardig the price of his medications.  Purchased a very high deductible medicare plan for 2022 not knowing he would be placed on multiple brand name products.  Entresto and Eliquis both cost him >500$.  Home BP readings: Not checking BP at home  Wt Readings from Last 3 Encounters:  12/21/20 222 lb (100.7 kg)  12/09/20 215 lb 9.6 oz (97.8 kg)  12/06/20 219 lb (99.3 kg)   BP Readings from Last 3 Encounters:  12/21/20 (!) 108/58  12/09/20 92/64  11/30/20 112/70   Pulse Readings from Last 3 Encounters:  12/21/20 (!) 57  12/09/20 70  11/30/20 64    Renal function: Estimated Creatinine Clearance: 82.6 mL/min (by C-G formula based on SCr of 1.01  mg/dL).  Past Medical History:  Diagnosis Date   Chest pain    with nagative cardiolite   Gout    occational flare   Hyperlipemia    Persistent atrial fibrillation (HCC)    Peyronie disease    S/P CABG x 2 06/24/2017   [facility] L[facility]   S/P Maze operation for atrial fibrillation 06/24/2017   Left atrial lesion set using bipolar radiofrequency and cryothermy ablation via conventional median sternotomy with clipping of LA appendage   Visit for monitoring Tikosyn therapy 06/18/2017    Current Outpatient Medications on File Prior to Visit  Medication Sig Dispense Refill   apixaban (ELIQUIS) 5 MG TABS tablet Take 1 tablet (5 mg total) by mouth 2 (two) times daily. 60 tablet 3   furosemide (LASIX) 40 MG tablet Take 1 tablet (40 mg total) by mouth daily. 90 tablet 3   metoprolol tartrate (LOPRESSOR) 25 MG tablet Take 1 tablet (25 mg total) by mouth 2 (two) times daily. 180 tablet 3   rosuvastatin (CRESTOR) 10 MG tablet Take 1 tablet (10 mg total) by mouth daily. 90 tablet 1   sacubitril-valsartan (ENTRESTO) 24-26 MG Take 1 tablet by mouth 2 (two) times daily. 60 tablet 3  sildenafil (VIAGRA) 25 MG tablet Take 25 mg by mouth daily as needed for erectile dysfunction.     cholecalciferol (VITAMIN D) 25 MCG (1000 UNIT) tablet Take 1,000 Units by mouth daily. (Patient not taking: No sig reported)     magnesium gluconate (MAGONATE) 500 MG tablet Take 500 mg by mouth at bedtime. (Patient not taking: No sig reported)     Multiple Vitamins-Minerals (ZINC PO) Take 250 mg by mouth daily. (Patient not taking: No sig reported)     vitamin C (ASCORBIC ACID) 500 MG tablet Take 500 mg by mouth daily. (Patient not taking: No sig reported)     No current facility-administered medications on file prior to visit.    No Known Allergies  Assessment/Plan:  1. Hypertension/CHF -  Patient BP in room 108/58 which is at goal of <130/80.  Unfortunately not much room to titrate  Entresto or carvedilol or add spironolactone.  Recommended patient reduce sodium intake to less than 1564m a day.  Recommended patient begin weighing himself every morning and to call clinic if he gains >3# overnight or 5# in a week.  Counseled that there should be no drug interactions between Viagra and his current medications.  However, if he ever gets placed on nitroglycerin or isosorbide he will need to hold Viagra.  Open enrollment for Medicare 2023 starts in October. Gave patient a list of brand name cardiac and lipid lowering medications to search for researching a new plan.  Patient currently considering switching to warfarin for the rest of 2022.  Recommended patient start Farxiga/Jardiance to help with both cardiac function and blood sugar however patient is wary of cost and would like to research both products first.  Has appointment with Dr JMartiniquein October.  Recommended rechecking with PharmD in September to review consideration of SGLT2i.  Patient agreeable.  Continue Entresto 24-26 BID Continue metoprolol 23mBID Continue furosemide 4066maily Recheck in 4-6 weeks  ChrKarren CobbleharmD, BCACP, CDCWoodwayPPNotre Dame[address] StreetreChester HillC [id number]: [phone]: [phone]/2022 5:04 PM

## 2021-01-04 NOTE — Telephone Encounter (Signed)
**Note De-Identified Agamjot Kilgallon Obfuscation** I called [employer] and s/w Kimesha who advised me that this Eliquis PA has been approved until 05/20/21. EOC ID: [id number]  I called [facility] and they confirmed that pts Eliquis is now going through with ins coverage. The pharmacist states that they are filling the RX now and will contact the pt when ready for pick up.

## 2021-01-24 MED ORDER — APIXABAN 5 MG PO TABS: 5.0000 mg | ORAL_TABLET | Freq: Two times a day (BID) | ORAL | 3 refills | Status: AC

## 2021-02-07 ENCOUNTER — Other Ambulatory Visit: Payer: Self-pay

## 2021-02-07 ENCOUNTER — Ambulatory Visit (INDEPENDENT_AMBULATORY_CARE_PROVIDER_SITE_OTHER): Payer: Medicare Other | Admitting: Pharmacist

## 2021-02-07 VITALS — BP 100/66 | HR 68 | Resp 14 | Ht 73.5 in | Wt 222.8 lb

## 2021-02-07 DIAGNOSIS — R7303 Prediabetes: Secondary | ICD-10-CM

## 2021-02-07 DIAGNOSIS — I255 Ischemic cardiomyopathy: Secondary | ICD-10-CM | POA: Diagnosis not present

## 2021-02-07 DIAGNOSIS — I5031 Acute diastolic (congestive) heart failure: Secondary | ICD-10-CM | POA: Diagnosis not present

## 2021-02-07 NOTE — Progress Notes (Signed)
Patient ID: Hector Clark                 DOB: June 23, 1945                      MRN: [id number]     HPI: Hector Clark is a 75 y.o. male referred by [name] to pharmacy clinic for HF medication management. PMH is significant for a fib, NSTEMI, CABG, HLD, and CHF. Most recent LVEF 35-40% on 10/30/19.  Patient presents today for 1 month follow up for CHF titration.  Has appointment with [name] on 10/10. 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  His home office is upstairs and he reports SOB going up the stairs.    He has worked on diet changes.  Is trying to cut down on salt and sugar. Salt has been especially challenging for him.  Eats more salads and vegetables now. Has reduced starches, rice, potatoes, and pasta.  Has reduced his caffeine intake.  Has struggled with medication costs.  Is receiving Eliquis from [place] for 90 dollars a month and his copay for entresto is 102 dollars a month.  Of note, patient also receives care at the [facility] and since he had agent orange exposure in [place], many services are covered.    Has not been checking blood pressure at home.  Current CHF meds: Entresto 24-26 BID, furosemide 40mg  daily, metoprolol tartrate 25mg  BID   BP goal: <130/80  Home BP readings: n/a  Wt Readings from Last 3 Encounters:  12/21/20 222 lb (100.7 kg)  12/09/20 215 lb 9.6 oz (97.8 kg)  12/06/20 219 lb (99.3 kg)   BP Readings from Last 3 Encounters:  12/21/20 (!) 108/58  12/09/20 92/64  11/30/20 112/70   Pulse Readings from Last 3 Encounters:  12/21/20 (!) 57  12/09/20 70  11/30/20 64    Renal function: CrCl cannot be calculated (Patient's most recent lab result is older than the maximum 21 days allowed.).  Past Medical History:  Diagnosis Date   Chest pain    with nagative cardiolite   Gout    occational flare   Hyperlipemia    Persistent atrial fibrillation (HCC)    Peyronie disease    S/P CABG x 2 06/24/2017   LIMA to  LAD and SVG to OM with [facility] via right thigh   S/P Maze operation for atrial fibrillation 06/24/2017   Left atrial lesion set using bipolar radiofrequency and cryothermy ablation via conventional median sternotomy with clipping of LA appendage   Visit for monitoring Tikosyn therapy 06/18/2017    Current Outpatient Medications on File Prior to Visit  Medication Sig Dispense Refill   apixaban (ELIQUIS) 5 MG TABS tablet Take 1 tablet (5 mg total) by mouth 2 (two) times daily. 60 tablet 3   cholecalciferol (VITAMIN D) 25 MCG (1000 UNIT) tablet Take 1,000 Units by mouth daily. (Patient not taking: No sig reported)     furosemide (LASIX) 40 MG tablet Take 1 tablet (40 mg total) by mouth daily. 90 tablet 3   magnesium gluconate (MAGONATE) 500 MG tablet Take 500 mg by mouth at bedtime. (Patient not taking: No sig reported)     metoprolol tartrate (LOPRESSOR) 25 MG tablet Take 1 tablet (25 mg total) by mouth 2 (two) times daily. 180 tablet 3   Multiple Vitamins-Minerals (ZINC PO) Take 250 mg by mouth daily. (Patient not taking: No sig reported)  rosuvastatin (CRESTOR) 10 MG tablet Take 1 tablet (10 mg total) by mouth daily. 90 tablet 1   sacubitril-valsartan (ENTRESTO) 24-26 MG Take 1 tablet by mouth 2 (two) times daily. 60 tablet 3   sildenafil (VIAGRA) 25 MG tablet Take 25 mg by mouth daily as needed for erectile dysfunction.     vitamin C (ASCORBIC ACID) 500 MG tablet Take 500 mg by mouth daily. (Patient not taking: No sig reported)     No current facility-administered medications on file prior to visit.    No Known Allergies   Assessment/Plan:  1. CHF -  Patient BP in room 100/66 which is at goal of <130/80 but does not allow for much room for medication titration.   Recommended patient reach out to the [facility] [place] to see if he can receive his medications from a [place] for less cost.  Since patient last A1c was 6.8 on 10/30/19, recommend updating at this time.  Advised a good next step would be  [place] or [place] however these medications are also brand name and may be cost prohibitive if he is unable to pick them up from the [facility] [place].  Will update BMP and A1c today.  Recommended patient pick up a blood pressure cuff and begin checking his BP and pulse at home.    Will await results from BMP and A1c before any medication changes.  [facility]  Continue Entresto 24-26mg  BID Continue furosemide 40mg  daily Continue metoprolol tartrate 25mg  BID  Karren Cobble, PharmD, BCACP, CDCES, [place] [address], [place], [place] [address] Phone: [phone]; Fax: [phone] 02/08/2021 5:15 PM

## 2021-02-07 NOTE — Patient Instructions (Signed)
It was nice seeing you again today!  We would like to keep your blood pressure less than 130/80  Congratulations on changing your diet and reducing salt and sugar intake  Continue your Entresto 24-26mg  twice a day Continue furosemide 40mg  daily Continue metoprolol 25mg  twice a day  I would recommend beginning to check your blood pressure at home as well as your pulse  The next step would be adding on a new medication called Jardiance or Wilder Glade which we can start when your new plan begins in January. If [facility] let me know and we can send a prescription.    I will put in the lab orders for your before your appointment with [name]  Please call with any questions  Karren Cobble, PharmD, BCACP, [place], [place]. [address], [place], [place] [address] Phone: [phone]; Fax: [phone] 02/07/2021 8:29 AM

## 2021-02-21 DIAGNOSIS — I255 Ischemic cardiomyopathy: Secondary | ICD-10-CM | POA: Diagnosis not present

## 2021-02-21 DIAGNOSIS — I5031 Acute diastolic (congestive) heart failure: Secondary | ICD-10-CM | POA: Diagnosis not present

## 2021-02-21 DIAGNOSIS — R7303 Prediabetes: Secondary | ICD-10-CM | POA: Diagnosis not present

## 2021-02-21 LAB — BASIC METABOLIC PANEL
BUN/Creatinine Ratio: 19 (ref 10–24)
BUN: 15 mg/dL (ref 8–27)
CO2: 21 mmol/L (ref 20–29)
Calcium: 9.7 mg/dL (ref 8.6–10.2)
Chloride: 102 mmol/L (ref 96–106)
Creatinine, Ser: 0.81 mg/dL (ref 0.76–1.27)
Glucose: 129 mg/dL — ABNORMAL HIGH (ref 70–99)
Potassium: 4.6 mmol/L (ref 3.5–5.2)
Sodium: 139 mmol/L (ref 134–144)
eGFR: 93 mL/min/{1.73_m2} (ref >59)

## 2021-02-21 LAB — HEMOGLOBIN A1C
Est. average glucose Bld gHb Est-mCnc: 146 mg/dL
Hgb A1c MFr Bld: 6.7 % — ABNORMAL HIGH (ref 4.8–5.6)

## 2021-02-24 NOTE — Progress Notes (Signed)
Cardiology Office Note   Date:  02/27/2021   ID:  Hector Clark, DOB 1946/03/04, MRN [id number]  PCP:  Lavone Orn, MD  Cardiologist:   Masiya Claassen Martinique, MD   Chief Complaint  Patient presents with   Congestive Heart Failure   Atrial Fibrillation       History of Present Illness: Hector Clark is a 75 y.o. male with a past medical history of CAD s/p CABG in 06/2017, persistent atrial fibrillation s/p MAZE procedure and LAA clip, prediabetes, and hyperlipidemia.  Holter monitor obtained in September 2018 showed atrial fibrillation with episodes of PVCs and nonsustained ventricular tachycardia versus aberrancy.  He had previous DCCV in November 2018 and underwent successful cardioversion.  TEE obtained in February 2019 showed EF 35 to 40%, trace MR and TR. In February 2019 he presented with STEMI with ST elevation in the inferior leads. He was taken to the cath lab emergently and had critical left main stenosis and occluded LCx. He then underwent emergent CABG with Dr Roxy Manns. This included a LIMA to the LAD and SVG to OM. He had MAZE and LA clipping.  His Tikosyn was discontinued in June 2019.    He has not had any recurrence of atrial fibrillation following his MAZE procedure.   He presented to the ED on 09/22/2018 with back pain.  CT was negative for acute dissection or large PE.  Troponin was negative.    He had done well post MAZE with no known episodes of afib until he noted an elevated heart rate on his FitBit on 11/04/20. ECG confirmed atrial flutter with rapid rates. He underwent TEE guided DCCV on 11/25/20. Echo showed reduced EF 35-40%.  On follow up today he reports he is feeling very well. He denies any dyspnea, tachycardia, chest pain. Has minimal pedal edema.     Past Medical History:  Diagnosis Date   Chest pain    with nagative cardiolite   Gout    occational flare   Hyperlipemia    Persistent atrial fibrillation (HCC)    Peyronie disease    S/P CABG x 2 06/24/2017    LIMA to LAD and SVG to OM with [facility] via right thigh   S/P Maze operation for atrial fibrillation 06/24/2017   Left atrial lesion set using bipolar radiofrequency and cryothermy ablation via conventional median sternotomy with clipping of LA appendage   Visit for monitoring Tikosyn therapy 06/18/2017    Past Surgical History:  Procedure Laterality Date   bone graph     CARDIOVERSION N/A 03/25/2017   Procedure: CARDIOVERSION;  Surgeon: Sueanne Margarita, MD;  Location: [place];  Service: Cardiovascular;  Laterality: N/A;   CARDIOVERSION N/A 11/25/2020   Procedure: CARDIOVERSION;  Surgeon: Buford Dresser, MD;  Location: [place] [facility];  Service: Cardiovascular;  Laterality: N/A;   CLIPPING OF ATRIAL APPENDAGE N/A 06/24/2017   Procedure: CLIPPING OF ATRIAL APPENDAGE;  Surgeon: Rexene Alberts, MD;  Location: [place];  Service: Open Heart Surgery;  Laterality: N/A;   CORONARY ARTERY BYPASS GRAFT N/A 06/24/2017   Procedure: CORONARY ARTERY BYPASS GRAFTING (CABG) times two utilizing left anterior mammary artery and right greater saphenous vein harvested endoscopically;  Surgeon: Rexene Alberts, MD;  Location: [place];  Service: Open Heart Surgery;  Laterality: N/A;   IABP INSERTION N/A 06/24/2017   Procedure: IABP Insertion;  Surgeon: Martinique, Vanette Noguchi M, MD;  Location: [place] CV LAB;  Service: Cardiovascular;  Laterality: N/A;   LEFT HEART CATH AND CORONARY ANGIOGRAPHY N/A  06/24/2017   Procedure: LEFT HEART CATH AND CORONARY ANGIOGRAPHY;  Surgeon: Martinique, Laurajean Hosek M, MD;  Location: [place] CV [facility];  Service: Cardiovascular;  Laterality: N/A;   MAZE N/A 06/24/2017   Procedure: MAZE;  Surgeon: Rexene Alberts, MD;  Location: [place];  Service: Open Heart Surgery;  Laterality: N/A;   TEE WITHOUT CARDIOVERSION N/A 06/24/2017   Procedure: TRANSESOPHAGEAL ECHOCARDIOGRAM (TEE);  Surgeon: Rexene Alberts, MD;  Location: [place];  Service: Open Heart Surgery;  Laterality: N/A;   TEE WITHOUT CARDIOVERSION N/A  11/25/2020   Procedure: TRANSESOPHAGEAL ECHOCARDIOGRAM (TEE);  Surgeon: Buford Dresser, MD;  Location: [facility];  Service: Cardiovascular;  Laterality: N/A;   TOE SURGERY Left    4th toe   tophus       Current Outpatient Medications  Medication Sig Dispense Refill   apixaban (ELIQUIS) 5 MG TABS tablet Take 1 tablet (5 mg total) by mouth 2 (two) times daily. 60 tablet 3   cholecalciferol (VITAMIN D) 25 MCG (1000 UNIT) tablet Take 1,000 Units by mouth daily.     empagliflozin (JARDIANCE) 10 MG TABS tablet Take 1 tablet (10 mg total) by mouth daily before breakfast. 30 tablet 6   furosemide (LASIX) 40 MG tablet Take one-half tablet (20 mg) every morning. 90 tablet 3   metoprolol tartrate (LOPRESSOR) 25 MG tablet Take 1 tablet (25 mg total) by mouth 2 (two) times daily. 180 tablet 3   Multiple Vitamins-Minerals (ZINC PO) Take 250 mg by mouth daily.     rosuvastatin (CRESTOR) 10 MG tablet Take 1 tablet (10 mg total) by mouth daily. 90 tablet 1   sacubitril-valsartan (ENTRESTO) 24-26 MG Take 1 tablet by mouth 2 (two) times daily. 60 tablet 3   sildenafil (VIAGRA) 25 MG tablet Take 25 mg by mouth daily as needed for erectile dysfunction.     vitamin C (ASCORBIC ACID) 500 MG tablet Take 500 mg by mouth daily.     No current facility-administered medications for this visit.    Allergies:   Patient has no known allergies.    Social History:  The patient  reports that he has never smoked. He has never used smokeless tobacco. He reports current alcohol use. He reports that he does not use drugs.   Family History:  The patient's family history includes Esophageal cancer (age of onset: 35) in his father; Healthy in his daughter; Liver cancer (age of onset: 81) in his mother.    ROS:  Please see the history of present illness.   Otherwise, review of systems are positive for none.   All other systems are reviewed and negative.    PHYSICAL EXAM: VS:  BP (!) 99/6   Pulse 70   Ht 6\' 2"   (1.88 m)   Wt 221 lb (100.2 kg)   SpO2 99%   BMI 28.37 kg/m  , BMI Body mass index is 28.37 kg/m. GEN: Well nourished, well developed, in no acute distress  HEENT: normal  Neck: no JVD, carotid bruits, or masses Cardiac: RRR; no murmurs, rubs, or gallops,no edema  Respiratory:  clear to auscultation bilaterally, normal work of breathing GI: soft, nontender, nondistended, + BS MS: no deformity or atrophy  Skin: warm and dry, no rash Neuro:  Strength and sensation are intact Psych: euthymic mood, full affect   EKG:  EKG is not ordered today.    Recent Labs: 11/23/2020: Hemoglobin 15.6; Platelets 312 12/13/2020: ALT 17 02/21/2021: BUN 15; Creatinine, Ser 0.81; Potassium 4.6; Sodium 139  Lipid Panel    Component Value Date/Time   CHOL 115 12/13/2020 0827   TRIG 125 12/13/2020 0827   HDL 41 12/13/2020 0827   CHOLHDL 2.8 12/13/2020 0827   LDLCALC 52 12/13/2020 0827      Wt Readings from Last 3 Encounters:  02/27/21 221 lb (100.2 kg)  02/07/21 222 lb 12.8 oz (101.1 kg)  12/21/20 222 lb (100.7 kg)      Other studies Reviewed: Additional studies/ records that were reviewed today include:   CABG x 2 on 06/24/2017   Procedure:        Coronary Artery Bypass Grafting x 2              Left Internal Mammary Artery to Distal Left Anterior Descending Coronary Artery             Saphenous Vein Graft to Obtuse Marginal Branch of Left Circumflex Coronary Artery             Endoscopic Vein Harvest from Right Thigh    Maze Procedure              Left atrial lesion set using bipolar radiofrequency and cryothermy ablation             Clipping of left atrial appendage (Atriclip size 60mm)   Echo 11/06/17: Study Conclusions   - Left ventricle: The cavity size was normal. Wall thickness was   increased in a pattern of mild LVH. Systolic function was normal.   The estimated ejection fraction was in the range of 50% to 55%.   Diffuse hypokinesis. There was no evidence of elevated    ventricular filling pressure by Doppler parameters. - Aortic valve: Trileaflet; mildly thickened, mildly calcified   leaflets.  Echo 12/15/20: IMPRESSIONS     1. Left ventricular ejection fraction, by estimation, is 35 to 40%. The  left ventricle has moderately decreased function. The left ventricle  demonstrates global hypokinesis.   2. Right ventricular systolic function is normal. The right ventricular  size is normal.   3. S/P prior LAA clip, with very small residual ostium of LAA. No  thrombus noted.. No left atrial/left atrial appendage thrombus was  detected.   4. Bowing/trivial prolapse of the posterior mitral valve leaflet at P2.  The mitral valve is abnormal. Trivial mitral valve regurgitation.   5. The aortic valve is tricuspid. Aortic valve regurgitation is not  visualized. No aortic stenosis is present.   6. There is Moderate (Grade III) plaque involving the descending aorta.   Conclusion(s)/Recommendation(s): Normal biventricular function without  evidence of hemodynamically significant valvular heart disease. No LA/LAA  thrombus identified. Successful cardioversion performed with restoration  of normal sinus rhythm  Myoview 12/06/20: Study Highlights    The left ventricular ejection fraction is moderately decreased (30-44%). Nuclear stress EF: 41%. This is an intermediate risk study.   Abnormal, intermediate risk stress nuclear study with no ischemia or infarction.  Gated ejection fraction 41% with global hypokinesis and mild left ventricular enlargement.  Study interpreted as intermediate risk due to reduced LV function.  ASSESSMENT AND PLAN:  1.  CAD s/p CABG in Feb 2019: Continue aspirin.  He is asymptomatic.   Hyperlipidemia: LDL 41. On Crestor 10 mg daily. Tolerating well. History of intolerance to lipitor.    Prediabetes: Recent hemoglobin A1c was increased to 6.7%.  discussed lifestyle modification with weight loss and carbohydrate restricted diet.  Discussed adding an SGLT 2 inhibitor for his DM and CHF. He is agreeable. Will start  Jardiance 10 mg daily.     4. Persistent atrial fibrillation s/p Maze procedure and LAA clip: Recurrent atrial flutter in June 2022. S/p TEE guided DCCV    5. Chronic systolic CHF. EF down to 40%. No ischemia on Myoview. Suspect rate related. On Entresto and beta blocker. Will add Jardiance. Reduce lasix to 20 mg daily. Plan to repeat Echo within the next month.   Current medicines are reviewed at length with the patient today.  The patient does not have concerns regarding medicines.  The following changes have been made:  no change  Labs/ tests ordered today include:  No orders of the defined types were placed in this encounter.    Disposition:   FU with me in 6 months.   Signed, Anamarie Hunn Martinique, MD  02/27/2021 5:28 PM    [facility] [address], [place], [place], [address] Phone [phone], Fax [phone]

## 2021-02-27 ENCOUNTER — Ambulatory Visit (INDEPENDENT_AMBULATORY_CARE_PROVIDER_SITE_OTHER): Payer: Medicare Other | Admitting: Cardiology

## 2021-02-27 ENCOUNTER — Encounter: Payer: Self-pay | Admitting: Cardiology

## 2021-02-27 ENCOUNTER — Other Ambulatory Visit: Payer: Self-pay

## 2021-02-27 VITALS — BP 99/6 | HR 70 | Ht 74.0 in | Wt 221.0 lb

## 2021-02-27 DIAGNOSIS — I10 Essential (primary) hypertension: Secondary | ICD-10-CM

## 2021-02-27 DIAGNOSIS — I255 Ischemic cardiomyopathy: Secondary | ICD-10-CM | POA: Diagnosis not present

## 2021-02-27 DIAGNOSIS — E785 Hyperlipidemia, unspecified: Secondary | ICD-10-CM

## 2021-02-27 DIAGNOSIS — I5022 Chronic systolic (congestive) heart failure: Secondary | ICD-10-CM | POA: Diagnosis not present

## 2021-02-27 MED ORDER — FUROSEMIDE 40 MG PO TABS: ORAL_TABLET | ORAL | 3 refills | Status: DC

## 2021-02-27 MED ORDER — EMPAGLIFLOZIN 10 MG PO TABS: 10.0000 mg | ORAL_TABLET | Freq: Every day | ORAL | 6 refills | Status: DC

## 2021-02-27 NOTE — Addendum Note (Signed)
Addended by: Betha Loa F on: 02/27/2021 05:36 PM   Modules accepted: Orders

## 2021-02-27 NOTE — Patient Instructions (Addendum)
Medication Instructions:  Decrease your lasix 40 mg to one-half tablet (20 mg) every morning. Start Jardiance 10 every morning with breakfast. Continue all other medications. *If you need a refill on your cardiac medications before your next appointment, please call your pharmacy*   Lab Work: None ordered.   Testing/Procedures: Echo  Follow-Up: At [facility], you and your health needs are our priority.  As part of our continuing mission to provide you with exceptional heart care, we have created designated Provider Care Teams.  These Care Teams include your primary Cardiologist (physician) and Advanced Practice Providers (APPs -  Physician Assistants and Nurse Practitioners) who all work together to provide you with the care you need, when you need it.  We recommend signing up for the patient portal called "MyChart".  Sign up information is provided on this After Visit Summary.  MyChart is used to connect with patients for Virtual Visits (Telemedicine).  Patients are able to view lab/test results, encounter notes, upcoming appointments, etc.  Non-urgent messages can be sent to your provider as well.   To learn more about what you can do with MyChart, go to [email or web address].    Your next appointment:  6 months. Call in January for April 2023 appointment    The format for your next appointment:  Office   Provider:  [name]

## 2021-03-13 ENCOUNTER — Ambulatory Visit (HOSPITAL_COMMUNITY): Payer: Medicare Other | Attending: Cardiovascular Disease

## 2021-03-13 ENCOUNTER — Other Ambulatory Visit: Payer: Self-pay

## 2021-03-13 DIAGNOSIS — I5022 Chronic systolic (congestive) heart failure: Secondary | ICD-10-CM | POA: Diagnosis not present

## 2021-03-13 DIAGNOSIS — E785 Hyperlipidemia, unspecified: Secondary | ICD-10-CM | POA: Insufficient documentation

## 2021-03-13 DIAGNOSIS — I255 Ischemic cardiomyopathy: Secondary | ICD-10-CM | POA: Diagnosis not present

## 2021-03-13 DIAGNOSIS — I10 Essential (primary) hypertension: Secondary | ICD-10-CM | POA: Diagnosis not present

## 2021-03-13 LAB — ECHOCARDIOGRAM COMPLETE
Area-P 1/2: 3.53 cm2
S' Lateral: 3.7 cm

## 2021-03-20 ENCOUNTER — Other Ambulatory Visit: Payer: Self-pay | Admitting: Cardiology

## 2021-05-29 ENCOUNTER — Encounter: Payer: Self-pay | Admitting: Cardiology

## 2021-07-31 DIAGNOSIS — H18821 Corneal disorder due to contact lens, right eye: Secondary | ICD-10-CM | POA: Diagnosis not present

## 2021-08-02 DIAGNOSIS — H18821 Corneal disorder due to contact lens, right eye: Secondary | ICD-10-CM | POA: Diagnosis not present

## 2021-08-02 DIAGNOSIS — H2513 Age-related nuclear cataract, bilateral: Secondary | ICD-10-CM | POA: Diagnosis not present

## 2021-08-03 DIAGNOSIS — I480 Paroxysmal atrial fibrillation: Secondary | ICD-10-CM | POA: Diagnosis not present

## 2021-08-03 DIAGNOSIS — I700 Atherosclerosis of aorta: Secondary | ICD-10-CM | POA: Diagnosis not present

## 2021-08-03 DIAGNOSIS — I2510 Atherosclerotic heart disease of native coronary artery without angina pectoris: Secondary | ICD-10-CM | POA: Diagnosis not present

## 2021-08-03 DIAGNOSIS — I255 Ischemic cardiomyopathy: Secondary | ICD-10-CM | POA: Diagnosis not present

## 2021-08-03 DIAGNOSIS — E7800 Pure hypercholesterolemia, unspecified: Secondary | ICD-10-CM | POA: Diagnosis not present

## 2021-08-03 DIAGNOSIS — R0989 Other specified symptoms and signs involving the circulatory and respiratory systems: Secondary | ICD-10-CM | POA: Diagnosis not present

## 2021-08-03 DIAGNOSIS — E1142 Type 2 diabetes mellitus with diabetic polyneuropathy: Secondary | ICD-10-CM | POA: Diagnosis not present

## 2021-08-03 DIAGNOSIS — Z1389 Encounter for screening for other disorder: Secondary | ICD-10-CM | POA: Diagnosis not present

## 2021-08-03 DIAGNOSIS — M1000 Idiopathic gout, unspecified site: Secondary | ICD-10-CM | POA: Diagnosis not present

## 2021-08-03 DIAGNOSIS — Z0000 Encounter for general adult medical examination without abnormal findings: Secondary | ICD-10-CM | POA: Diagnosis not present

## 2021-08-07 DIAGNOSIS — H18821 Corneal disorder due to contact lens, right eye: Secondary | ICD-10-CM | POA: Diagnosis not present

## 2021-08-07 DIAGNOSIS — H2513 Age-related nuclear cataract, bilateral: Secondary | ICD-10-CM | POA: Diagnosis not present

## 2021-08-08 ENCOUNTER — Encounter: Payer: Self-pay | Admitting: Pharmacist

## 2021-08-23 DIAGNOSIS — H52223 Regular astigmatism, bilateral: Secondary | ICD-10-CM | POA: Diagnosis not present

## 2021-08-23 DIAGNOSIS — H2513 Age-related nuclear cataract, bilateral: Secondary | ICD-10-CM | POA: Diagnosis not present

## 2021-08-23 DIAGNOSIS — H524 Presbyopia: Secondary | ICD-10-CM | POA: Diagnosis not present

## 2021-08-23 DIAGNOSIS — H18821 Corneal disorder due to contact lens, right eye: Secondary | ICD-10-CM | POA: Diagnosis not present

## 2021-08-23 DIAGNOSIS — H5213 Myopia, bilateral: Secondary | ICD-10-CM | POA: Diagnosis not present

## 2021-08-23 DIAGNOSIS — H1789 Other corneal scars and opacities: Secondary | ICD-10-CM | POA: Diagnosis not present

## 2021-10-28 NOTE — Progress Notes (Deleted)
Cardiology Office Note   Date:  10/28/2021   ID:  Hector Clark, DOB 1945/10/24, MRN [id number]  PCP:  Lavone Orn, MD  Cardiologist:   Hildred Mollica Martinique, MD   No chief complaint on file.      History of Present Illness: Hector Clark is a 76 y.o. male with a past medical history of CAD s/p CABG in 06/2017, persistent atrial fibrillation s/p MAZE procedure and LAA clip, prediabetes, and hyperlipidemia.  Holter monitor obtained in September 2018 showed atrial fibrillation with episodes of PVCs and nonsustained ventricular tachycardia versus aberrancy.  He had previous DCCV in November 2018 and underwent successful cardioversion.  TEE obtained in February 2019 showed EF 35 to 40%, trace MR and TR. In February 2019 he presented with STEMI with ST elevation in the inferior leads. He was taken to the cath lab emergently and had critical left main stenosis and occluded LCx. He then underwent emergent CABG with Dr Roxy Manns. This included a LIMA to the LAD and SVG to OM. He had MAZE and LA clipping.  His Tikosyn was discontinued in June 2019.    He has not had any recurrence of atrial fibrillation following his MAZE procedure.   He presented to the ED on 09/22/2018 with back pain.  CT was negative for acute dissection or large PE.  Troponin was negative.    He had done well post MAZE with no known episodes of afib until he noted an elevated heart rate on his FitBit on 11/04/20. ECG confirmed atrial flutter with rapid rates. He underwent TEE guided DCCV on 11/25/20. Echo showed reduced EF 35-40%.  Repeat Echo in Oct 2022 showed normal LV function with EF 55-60%.   On follow up today he reports he is feeling very well. He denies any dyspnea, tachycardia, chest pain. Has minimal pedal edema.     Past Medical History:  Diagnosis Date   Chest pain    with nagative cardiolite   Gout    occational flare   Hyperlipemia    Persistent atrial fibrillation (HCC)    Peyronie disease    S/P CABG x 2  06/24/2017   LIMA to LAD and SVG to OM with [facility] via right thigh   S/P Maze operation for atrial fibrillation 06/24/2017   Left atrial lesion set using bipolar radiofrequency and cryothermy ablation via conventional median sternotomy with clipping of LA appendage   Visit for monitoring Tikosyn therapy 06/18/2017    Past Surgical History:  Procedure Laterality Date   bone graph     CARDIOVERSION N/A 03/25/2017   Procedure: CARDIOVERSION;  Surgeon: Sueanne Margarita, MD;  Location: [place];  Service: Cardiovascular;  Laterality: N/A;   CARDIOVERSION N/A 11/25/2020   Procedure: CARDIOVERSION;  Surgeon: Buford Dresser, MD;  Location: [place];  Service: Cardiovascular;  Laterality: N/A;   CLIPPING OF ATRIAL APPENDAGE N/A 06/24/2017   Procedure: CLIPPING OF ATRIAL APPENDAGE;  Surgeon: Rexene Alberts, MD;  Location: [place];  Service: Open Heart Surgery;  Laterality: N/A;   CORONARY ARTERY BYPASS GRAFT N/A 06/24/2017   Procedure: CORONARY ARTERY BYPASS GRAFTING (CABG) times two utilizing left anterior mammary artery and right greater saphenous vein harvested endoscopically;  Surgeon: Rexene Alberts, MD;  Location: [place];  Service: Open Heart Surgery;  Laterality: N/A;   IABP INSERTION N/A 06/24/2017   Procedure: IABP Insertion;  Surgeon: Martinique, Charlee Squibb M, MD;  Location: [place] [facility];  Service: Cardiovascular;  Laterality: N/A;   LEFT HEART CATH AND  CORONARY ANGIOGRAPHY N/A 06/24/2017   Procedure: LEFT HEART CATH AND CORONARY ANGIOGRAPHY;  Surgeon: Martinique, Lucile Didonato M, MD;  Location: [place] [facility];  Service: Cardiovascular;  Laterality: N/A;   MAZE N/A 06/24/2017   Procedure: MAZE;  Surgeon: Rexene Alberts, MD;  Location: [place];  Service: Open Heart Surgery;  Laterality: N/A;   TEE WITHOUT CARDIOVERSION N/A 06/24/2017   Procedure: TRANSESOPHAGEAL ECHOCARDIOGRAM (TEE);  Surgeon: Rexene Alberts, MD;  Location: [place];  Service: Open Heart Surgery;  Laterality: N/A;   TEE WITHOUT CARDIOVERSION  N/A 11/25/2020   Procedure: TRANSESOPHAGEAL ECHOCARDIOGRAM (TEE);  Surgeon: Buford Dresser, MD;  Location: [facility] END[facility];  Service: Cardiovascular;  Laterality: N/A;   TOE SURGERY Left    4th toe   tophus       Current Outpatient Medications  Medication Sig Dispense Refill   apixaban (ELIQUIS) 5 MG TABS tablet Take 1 tablet (5 mg total) by mouth 2 (two) times daily. 60 tablet 3   cholecalciferol (VITAMIN D) 25 MCG (1000 UNIT) tablet Take 1,000 Units by mouth daily.     empagliflozin (JARDIANCE) 10 MG TABS tablet Take 1 tablet (10 mg total) by mouth daily before breakfast. 30 tablet 6   furosemide (LASIX) 40 MG tablet Take one-half tablet (20 mg) every morning. 90 tablet 3   metoprolol tartrate (LOPRESSOR) 25 MG tablet Take 1 tablet (25 mg total) by mouth 2 (two) times daily. 180 tablet 3   Multiple Vitamins-Minerals (ZINC PO) Take 250 mg by mouth daily.     rosuvastatin (CRESTOR) 10 MG tablet Take 1 tablet (10 mg total) by mouth daily. 90 tablet 3   sacubitril-valsartan (ENTRESTO) 24-26 MG Take 1 tablet by mouth 2 (two) times daily. 60 tablet 3   sildenafil (VIAGRA) 25 MG tablet Take 25 mg by mouth daily as needed for erectile dysfunction.     vitamin C (ASCORBIC ACID) 500 MG tablet Take 500 mg by mouth daily.     No current facility-administered medications for this visit.    Allergies:   Patient has no known allergies.    Social History:  The patient  reports that he has never smoked. He has never used smokeless tobacco. He reports current alcohol use. He reports that he does not use drugs.   Family History:  The patient's family history includes Esophageal cancer (age of onset: 63) in his father; Healthy in his daughter; Liver cancer (age of onset: 67) in his mother.    ROS:  Please see the history of present illness.   Otherwise, review of systems are positive for none.   All other systems are reviewed and negative.    PHYSICAL EXAM: VS:  There were no vitals taken for  this visit. , BMI There is no height or weight on file to calculate BMI. GEN: Well nourished, well developed, in no acute distress  HEENT: normal  Neck: no JVD, carotid bruits, or masses Cardiac: RRR; no murmurs, rubs, or gallops,no edema  Respiratory:  clear to auscultation bilaterally, normal work of breathing GI: soft, nontender, nondistended, + BS MS: no deformity or atrophy  Skin: warm and dry, no rash Neuro:  Strength and sensation are intact Psych: euthymic mood, full affect   EKG:  EKG is not ordered today.    Recent Labs: 11/23/2020: Hemoglobin 15.6; Platelets 312 12/13/2020: ALT 17 02/21/2021: BUN 15; Creatinine, Ser 0.81; Potassium 4.6; Sodium 139   Dated 08/03/21: cholesterol 125, triglycerides 98, HDL 45, LDL 61,  A1c 6.7%. glucose 146.  Otherwise CMET normal  Lipid Panel    Component Value Date/Time   CHOL 115 12/13/2020 0827   TRIG 125 12/13/2020 0827   HDL 41 12/13/2020 0827   CHOLHDL 2.8 12/13/2020 0827   LDLCALC 52 12/13/2020 0827      Wt Readings from Last 3 Encounters:  02/27/21 221 lb (100.2 kg)  02/07/21 222 lb 12.8 oz (101.1 kg)  12/21/20 222 lb (100.7 kg)      Other studies Reviewed: Additional studies/ records that were reviewed today include:   CABG x 2 on 06/24/2017   Procedure:        Coronary Artery Bypass Grafting x 2              Left Internal Mammary Artery to Distal Left Anterior Descending Coronary Artery             Saphenous Vein Graft to Obtuse Marginal Branch of Left Circumflex Coronary Artery             Endoscopic Vein Harvest from Right Thigh    Maze Procedure              Left atrial lesion set using bipolar radiofrequency and cryothermy ablation             Clipping of left atrial appendage (Atriclip size 78m)   Echo 11/06/17: Study Conclusions   - Left ventricle: The cavity size was normal. Wall thickness was   increased in a pattern of mild LVH. Systolic function was normal.   The estimated ejection fraction was in the  range of 50% to 55%.   Diffuse hypokinesis. There was no evidence of elevated   ventricular filling pressure by Doppler parameters. - Aortic valve: Trileaflet; mildly thickened, mildly calcified   leaflets.  Echo 12/15/20: IMPRESSIONS     1. Left ventricular ejection fraction, by estimation, is 35 to 40%. The  left ventricle has moderately decreased function. The left ventricle  demonstrates global hypokinesis.   2. Right ventricular systolic function is normal. The right ventricular  size is normal.   3. S/P prior LAA clip, with very small residual ostium of LAA. No  thrombus noted.. No left atrial/left atrial appendage thrombus was  detected.   4. Bowing/trivial prolapse of the posterior mitral valve leaflet at P2.  The mitral valve is abnormal. Trivial mitral valve regurgitation.   5. The aortic valve is tricuspid. Aortic valve regurgitation is not  visualized. No aortic stenosis is present.   6. There is Moderate (Grade III) plaque involving the descending aorta.   Conclusion(s)/Recommendation(s): Normal biventricular function without  evidence of hemodynamically significant valvular heart disease. No LA/LAA  thrombus identified. Successful cardioversion performed with restoration  of normal sinus rhythm  Myoview 12/06/20: Study Highlights    The left ventricular ejection fraction is moderately decreased (30-44%). Nuclear stress EF: 41%. This is an intermediate risk study.   Abnormal, intermediate risk stress nuclear study with no ischemia or infarction.  Gated ejection fraction 41% with global hypokinesis and mild left ventricular enlargement.  Study interpreted as intermediate risk due to reduced LV function.  Echo 03/13/21: IMPRESSIONS     1. Left ventricular ejection fraction, by estimation, is 55 to 60%. The  left ventricle has normal function. The left ventricle has no regional  wall motion abnormalities. Left ventricular diastolic parameters are  indeterminate.    2. Right ventricular systolic function is normal. The right ventricular  size is normal. There is normal pulmonary artery systolic pressure. The  estimated right  ventricular systolic pressure is 99.8 mmHg.   3. The mitral valve is grossly normal. Mild mitral valve regurgitation.  No evidence of mitral stenosis.   4. The aortic valve is tricuspid. There is mild calcification of the  aortic valve. Aortic valve regurgitation is not visualized. Mild aortic  valve sclerosis is present, with no evidence of aortic valve stenosis.   5. The inferior vena cava is dilated in size with >50% respiratory  variability, suggesting right atrial pressure of 8 mmHg.   Comparison(s): No significant change from prior study.   ASSESSMENT AND PLAN:  1.  CAD s/p CABG in Feb 2019: Continue aspirin.  He is asymptomatic.   Hyperlipidemia: LDL 41. On Crestor 10 mg daily. Tolerating well. History of intolerance to lipitor.    Prediabetes: Recent hemoglobin A1c was increased to 6.7%.  discussed lifestyle modification with weight loss and carbohydrate restricted diet. Discussed adding an SGLT 2 inhibitor for his DM and CHF. He is agreeable. Will start Jardiance 10 mg daily.     4. Persistent atrial fibrillation s/p Maze procedure and LAA clip: Recurrent atrial flutter in June 2022. S/p TEE guided DCCV    5. Chronic systolic CHF. EF was down to 40%. No ischemia on Myoview. Suspect rate related. On repeat Echo in October LV function had normalized with EF 55-60%. On Entresto and beta blocker. Will add Jardiance. Reduce lasix to 20 mg daily. Plan to repeat Echo within the next month.   Current medicines are reviewed at length with the patient today.  The patient does not have concerns regarding medicines.  The following changes have been made:  no change  Labs/ tests ordered today include:  No orders of the defined types were placed in this encounter.     Disposition:   FU with me in 6 months.   Signed, Zarra Geffert  Martinique, MD  10/28/2021 9:40 AM    [facility] [address] [place], [place], [address] Phone [phone], Fax [phone]

## 2021-10-30 ENCOUNTER — Telehealth: Payer: Self-pay | Admitting: Cardiology

## 2021-10-30 ENCOUNTER — Emergency Department (HOSPITAL_COMMUNITY): Payer: Medicare Other

## 2021-10-30 ENCOUNTER — Inpatient Hospital Stay (HOSPITAL_COMMUNITY)
Admission: EM | Admit: 2021-10-30 | Discharge: 2021-11-01 | DRG: 309 | Disposition: A | Discharge status: Home / Self Care | Payer: Medicare Other | Attending: Internal Medicine | Admitting: Internal Medicine

## 2021-10-30 DIAGNOSIS — I4819 Other persistent atrial fibrillation: Secondary | ICD-10-CM | POA: Diagnosis present

## 2021-10-30 DIAGNOSIS — Z951 Presence of aortocoronary bypass graft: Secondary | ICD-10-CM

## 2021-10-30 DIAGNOSIS — E875 Hyperkalemia: Secondary | ICD-10-CM | POA: Diagnosis present

## 2021-10-30 DIAGNOSIS — E785 Hyperlipidemia, unspecified: Secondary | ICD-10-CM | POA: Diagnosis present

## 2021-10-30 DIAGNOSIS — I10 Essential (primary) hypertension: Secondary | ICD-10-CM | POA: Diagnosis not present

## 2021-10-30 DIAGNOSIS — I4892 Unspecified atrial flutter: Secondary | ICD-10-CM | POA: Diagnosis not present

## 2021-10-30 DIAGNOSIS — I252 Old myocardial infarction: Secondary | ICD-10-CM | POA: Diagnosis not present

## 2021-10-30 DIAGNOSIS — I4891 Unspecified atrial fibrillation: Principal | ICD-10-CM

## 2021-10-30 DIAGNOSIS — I2510 Atherosclerotic heart disease of native coronary artery without angina pectoris: Secondary | ICD-10-CM | POA: Diagnosis not present

## 2021-10-30 DIAGNOSIS — Z7984 Long term (current) use of oral hypoglycemic drugs: Secondary | ICD-10-CM

## 2021-10-30 DIAGNOSIS — Z7901 Long term (current) use of anticoagulants: Secondary | ICD-10-CM | POA: Diagnosis not present

## 2021-10-30 DIAGNOSIS — Z79899 Other long term (current) drug therapy: Secondary | ICD-10-CM | POA: Diagnosis not present

## 2021-10-30 DIAGNOSIS — I484 Atypical atrial flutter: Secondary | ICD-10-CM

## 2021-10-30 DIAGNOSIS — M109 Gout, unspecified: Secondary | ICD-10-CM | POA: Diagnosis not present

## 2021-10-30 DIAGNOSIS — I5042 Chronic combined systolic (congestive) and diastolic (congestive) heart failure: Secondary | ICD-10-CM | POA: Diagnosis not present

## 2021-10-30 DIAGNOSIS — I340 Nonrheumatic mitral (valve) insufficiency: Secondary | ICD-10-CM | POA: Diagnosis not present

## 2021-10-30 DIAGNOSIS — I110 Hypertensive heart disease with heart failure: Secondary | ICD-10-CM | POA: Diagnosis not present

## 2021-10-30 DIAGNOSIS — I422 Other hypertrophic cardiomyopathy: Secondary | ICD-10-CM | POA: Diagnosis present

## 2021-10-30 DIAGNOSIS — R7303 Prediabetes: Secondary | ICD-10-CM | POA: Diagnosis not present

## 2021-10-30 DIAGNOSIS — R0602 Shortness of breath: Secondary | ICD-10-CM | POA: Diagnosis not present

## 2021-10-30 LAB — BASIC METABOLIC PANEL
Anion gap: 10 (ref 5–15)
BUN: 12 mg/dL (ref 8–23)
CO2: 23 mmol/L (ref 22–32)
Calcium: 9.5 mg/dL (ref 8.9–10.3)
Chloride: 106 mmol/L (ref 98–111)
Creatinine, Ser: 1.03 mg/dL (ref 0.61–1.24)
GFR, Estimated: >60 mL/min (ref >60)
Glucose, Bld: 162 mg/dL — ABNORMAL HIGH (ref 70–99)
Potassium: 5 mmol/L (ref 3.5–5.1)
Sodium: 139 mmol/L (ref 135–145)

## 2021-10-30 LAB — CBC WITH DIFFERENTIAL/PLATELET
Abs Immature Granulocytes: 0.03 10*3/uL (ref 0.00–0.07)
Basophils Absolute: 0.1 10*3/uL (ref 0.0–0.1)
Basophils Relative: 1 %
Eosinophils Absolute: 0.1 10*3/uL (ref 0.0–0.5)
Eosinophils Relative: 1 %
HCT: 53.3 % — ABNORMAL HIGH (ref 39.0–52.0)
Hemoglobin: 17.1 g/dL — ABNORMAL HIGH (ref 13.0–17.0)
Immature Granulocytes: 0 %
Lymphocytes Relative: 17 %
Lymphs Abs: 1.8 10*3/uL (ref 0.7–4.0)
MCH: 29.8 pg (ref 26.0–34.0)
MCHC: 32.1 g/dL (ref 30.0–36.0)
MCV: 93 fL (ref 80.0–100.0)
Monocytes Absolute: 0.8 10*3/uL (ref 0.1–1.0)
Monocytes Relative: 8 %
Neutro Abs: 7.6 10*3/uL (ref 1.7–7.7)
Neutrophils Relative %: 73 %
Platelets: 292 10*3/uL (ref 150–400)
RBC: 5.73 MIL/uL (ref 4.22–5.81)
RDW: 12.8 % (ref 11.5–15.5)
WBC: 10.4 10*3/uL (ref 4.0–10.5)
nRBC: 0 % (ref 0.0–0.2)

## 2021-10-30 LAB — TROPONIN I (HIGH SENSITIVITY)
Troponin I (High Sensitivity): 7 ng/L (ref <18)
Troponin I (High Sensitivity): 6 ng/L (ref <18)

## 2021-10-30 LAB — MAGNESIUM: Magnesium: 2.2 mg/dL (ref 1.7–2.4)

## 2021-10-30 MED ORDER — DILTIAZEM HCL-DEXTROSE 125-5 MG/125ML-% IV SOLN (PREMIX)
5.0000 mg/h | INTRAVENOUS | Status: DC
  Administered 2021-10-30: 5 mg/h via INTRAVENOUS
  Administered 2021-10-31 – 2021-11-01 (×4): 12.5 mg/h via INTRAVENOUS
  Filled 2021-10-30 (×6): qty 125

## 2021-10-30 MED ORDER — APIXABAN 5 MG PO TABS
5.0000 mg | ORAL_TABLET | Freq: Two times a day (BID) | ORAL | Status: DC
  Administered 2021-10-30 – 2021-11-01 (×4): 5 mg via ORAL
  Filled 2021-10-30 (×4): qty 1

## 2021-10-30 MED ORDER — EMPAGLIFLOZIN 10 MG PO TABS
10.0000 mg | ORAL_TABLET | Freq: Every day | ORAL | Status: DC
  Administered 2021-10-31 – 2021-11-01 (×2): 10 mg via ORAL
  Filled 2021-10-30 (×3): qty 1

## 2021-10-30 MED ORDER — ACETAMINOPHEN 325 MG PO TABS
650.0000 mg | ORAL_TABLET | ORAL | Status: DC | PRN
Start: 2021-10-30 — End: 2021-11-01

## 2021-10-30 MED ORDER — ONDANSETRON HCL 4 MG/2ML IJ SOLN: 4.0000 mg | Freq: Four times a day (QID) | INTRAMUSCULAR | Status: DC | PRN

## 2021-10-30 MED ORDER — ROSUVASTATIN CALCIUM 5 MG PO TABS
10.0000 mg | ORAL_TABLET | Freq: Every day | ORAL | Status: DC
  Administered 2021-10-30 – 2021-11-01 (×3): 10 mg via ORAL
  Filled 2021-10-30 (×3): qty 2

## 2021-10-30 NOTE — ED Provider Notes (Signed)
[facility] EMERGENCY DEPARTMENT Provider Note   CSN: [id number] Arrival date & time: 10/30/21  1039     History  Chief Complaint  Patient presents with   Atrial Fibrillation    Hector Clark is a 76 y.o. male.  76 year old male with prior medical history as detailed below presents for evaluation.  Patient reports onset of palpitations around 2:30 this morning.  Patient with extensive cardiac history including history of atrial fibrillation.  He is compliant with his Eliquis.  Patient reports that his atrial fibrillation is paroxysmal in nature.  His last episode of documented atrial fibrillation was approximately 1 year ago in July 2022.  Patient reports heart rates into the 130s this morning.  He reports mild exertional dyspnea and weakness with this elevated heart rate.  At rest he is comfortable.  He denies associated chest pain.  He denies recent illness or other complaint.  The history is provided by the patient and medical records.  Atrial Fibrillation This is a recurrent problem. The current episode started 6 to 12 hours ago. The problem occurs rarely. The problem has not changed since onset.Pertinent negatives include no chest pain, no abdominal pain, no headaches and no shortness of breath.       Home Medications Prior to Admission medications   Medication Sig Start Date End Date Taking? Authorizing Provider  apixaban (ELIQUIS) 5 MG TABS tablet Take 1 tablet (5 mg total) by mouth 2 (two) times daily. 01/24/21   Deboraha Sprang, MD  cholecalciferol (VITAMIN D) 25 MCG (1000 UNIT) tablet Take 1,000 Units by mouth daily.    [provider]  empagliflozin (JARDIANCE) 10 MG TABS tablet Take 1 tablet (10 mg total) by mouth daily before breakfast. 02/27/21   Martinique, Dynastee Brummell M, MD  furosemide (LASIX) 40 MG tablet Take one-half tablet (20 mg) every morning. 02/27/21   Martinique, Bernardina Cacho M, MD  metoprolol tartrate (LOPRESSOR) 25 MG tablet Take 1 tablet  (25 mg total) by mouth 2 (two) times daily. 11/23/20 02/27/21  Deboraha Sprang, MD  Multiple Vitamins-Minerals (ZINC PO) Take 250 mg by mouth daily.    [provider]  rosuvastatin (CRESTOR) 10 MG tablet Take 1 tablet (10 mg total) by mouth daily. 03/20/21   Martinique, Sheryl Towell M, MD  sacubitril-valsartan (ENTRESTO) 24-26 MG Take 1 tablet by mouth 2 (two) times daily. 12/01/20   Martinique, Babette Stum M, MD  sildenafil (VIAGRA) 25 MG tablet Take 25 mg by mouth daily as needed for erectile dysfunction.    [provider]  vitamin C (ASCORBIC ACID) 500 MG tablet Take 500 mg by mouth daily.    [provider]      Allergies    Patient has no known allergies.    Review of Systems   Review of Systems  Respiratory:  Negative for shortness of breath.   Cardiovascular:  Negative for chest pain.  Gastrointestinal:  Negative for abdominal pain.  Neurological:  Negative for headaches.  All other systems reviewed and are negative.   Physical Exam Updated Vital Signs BP 98/85   Pulse (!) 124   Temp 98.1 F (36.7 C) (Oral)   Resp 16   SpO2 100%  Physical Exam Vitals and nursing note reviewed.  Constitutional:      General: He is not in acute distress.    Appearance: Normal appearance. He is well-developed.  HENT:     Head: Normocephalic and atraumatic.  Eyes:     Conjunctiva/sclera: Conjunctivae normal.  Pupils: Pupils are equal, round, and reactive to light.  Cardiovascular:     Rate and Rhythm: Tachycardia present. Rhythm irregular.     Heart sounds: Normal heart sounds.  Pulmonary:     Effort: Pulmonary effort is normal. No respiratory distress.     Breath sounds: Normal breath sounds.  Abdominal:     General: There is no distension.     Palpations: Abdomen is soft.     Tenderness: There is no abdominal tenderness.  Musculoskeletal:        General: No deformity. Normal range of motion.     Cervical back: Normal range of motion and neck supple.  Skin:    General:  Skin is warm and dry.  Neurological:     General: No focal deficit present.     Mental Status: He is alert and oriented to person, place, and time.     ED Results / Procedures / Treatments   Labs (all labs ordered are listed, but only abnormal results are displayed) Labs Reviewed  BASIC METABOLIC PANEL - Abnormal; Notable for the following components:      Result Value   Glucose, Bld 162 (*)    All other components within normal limits  CBC WITH DIFFERENTIAL/PLATELET - Abnormal; Notable for the following components:   Hemoglobin 17.1 (*)    HCT 53.3 (*)    All other components within normal limits  MAGNESIUM  TROPONIN I (HIGH SENSITIVITY)  TROPONIN I (HIGH SENSITIVITY)    EKG EKG Interpretation  Date/Time:  Monday October 30 2021 10:46:11 EDT Ventricular Rate:  134 PR Interval:  120 QRS Duration: 138 QT Interval:  340 QTC Calculation: 507 R Axis:   -33 Text Interpretation: Sinus tachycardia with Fusion complexes Left axis deviation Non-specific intra-ventricular conduction block Possible Anterolateral infarct , age undetermined Abnormal ECG Consistent with left bundle branch block no significant change from previous EKG When compared with ECG of 09-Dec-2020 08:32, PREVIOUS ECG IS PRESENT Confirmed by Fredia Sorrow [phone]) on 10/30/2021 10:56:54 AM  Radiology DG Chest 2 View  Result Date: 10/30/2021 CLINICAL DATA:  Shortness of breath. EXAM: CHEST - 2 VIEW COMPARISON:  Chest radiograph Sep 22, 2018. FINDINGS: No consolidation. No visible pleural effusions or pneumothorax. Cardiomediastinal silhouette is unchanged. CABG and left atrial clipping. IMPRESSION: No evidence of acute cardiopulmonary disease. Electronically Signed   By: Margaretha Sheffield M.D.   On: 10/30/2021 11:29    Procedures Procedures    Medications Ordered in ED Medications  diltiazem (CARDIZEM) 125 mg in dextrose 5% 125 mL (1 mg/mL) infusion (10 mg/hr Intravenous Rate/Dose Change 10/30/21 1605)  apixaban  (ELIQUIS) tablet 5 mg (has no administration in time range)    ED Course/ Medical Decision Making/ A&P                           Medical Decision Making Risk Prescription drug management. Decision regarding hospitalization.    Medical Screen Complete  This patient presented to the ED with complaint of palpitations, atrial fibrillation.  This complaint involves an extensive number of treatment options. The initial differential diagnosis includes, but is not limited to, arrhythmia, metabolic abnormality, etc.  This presentation is: Acute, Self-Limited, Previously Undiagnosed, Uncertain Prognosis, Complicated, Systemic Symptoms, and Threat to Life/Bodily Function  Patient with longstanding history of cardiac disease.  He is well-known to [facility] health cardiology.  He presents with recurrent atrial fibrillation that began sometime around 230 this morning.  Patient is minimally symptomatic at  rest.  Patient is interested in medical treatment of his arrhythmia.  He declines electrical cardioversion currently.  Will initiate Cardizem drip for rate control.  Cardiology is aware of case and will evaluate.    Co morbidities that complicated the patient's evaluation  CAD, CABG, persistent atrial fibrillation status post Maze procedure and LAA clip, hyperlipidemia   Additional history obtained:  External records from outside sources obtained and reviewed including prior ED visits and prior Inpatient records.    Lab Tests:  I ordered and personally interpreted labs.  The pertinent results include: CBC, BMP, magnesium, troponin   Imaging Studies ordered:  I ordered imaging studies including chest x-ray I independently visualized and interpreted obtained imaging which showed NAD I agree with the radiologist interpretation.   Cardiac Monitoring:  The patient was maintained on a cardiac monitor.  I personally viewed and interpreted the cardiac monitor which showed an  underlying rhythm of: AFib with RVR   Medicines ordered:  I ordered medication including Cardizem drip for atrial fibrillation rate control Reevaluation of the patient after these medicines showed that the patient: stayed the same     Problem List / ED Course:  Atrial fibrillation with RVR   Reevaluation:  After the interventions noted above, I reevaluated the patient and found that they have: stayed the same   Disposition:  After consideration of the diagnostic results and the patients response to treatment, I feel that the patent would benefit from admission.          Final Clinical Impression(s) / ED Diagnoses Final diagnoses:  Atrial fibrillation with RVR [facility])    Rx / DC Orders ED Discharge Orders     None         Valarie Merino, MD 10/30/21 1713

## 2021-10-30 NOTE — ED Provider Triage Note (Signed)
Emergency Medicine Provider Triage Evaluation Note  Hector Clark , a 76 y.o. male  was evaluated in triage.  Pt complains of shortness of breath with exertion.  He also states that his blood pressure was in the 80s at home and his heart rate was in the 130s.  He does endorse similar symptoms proximally 1 year ago when he had atrial fibrillation and RVR.  He denies chest pain right now.  Review of Systems  Positive:  Negative: See above   Physical Exam  BP 98/66 (BP Location: Right Arm)   Pulse (!) 134   Temp 98.1 F (36.7 C) (Oral)   Resp 16   SpO2 98%  Gen:   Awake, no distress   Resp:  Normal effort  MSK:   Moves extremities without difficulty  Other:  Tachycardic but appears regular.  Pulses are strong and symmetric in all 4 extremities.  Medical Decision Making  Medically screening exam initiated at 10:58 AM.  Appropriate orders placed.  Hector Clark was informed that the remainder of the evaluation will be completed by another provider, this initial triage assessment does not replace that evaluation, and the importance of remaining in the ED until their evaluation is complete.     Hector Clark, [place] 10/30/21 1058

## 2021-10-30 NOTE — ED Notes (Signed)
IV attempt x 2 unsuccessful.

## 2021-10-30 NOTE — Telephone Encounter (Signed)
Attempted to return call to patient. Left VM asking for patient to return call.

## 2021-10-30 NOTE — Telephone Encounter (Signed)
Pt sent this via mychart message to the sching pool:      Coming to your office. I would like an ekg  Blew my nose last night and it was bright red. I take eloquis hr has been 125 to 132 all night   Hr 133 last bp 84/69 Feel kind of tired. This happened same time last year     Good Morning Hector Clark,  Can you tell me a little more about your heart rate?      What is your heart rate?   Do you have a log of your heart rate readings (document readings)?  Do you have any other symptoms?     HR 88/77 HR 132   now back to 130  HR just dropped to 69.   I have an appt with Dr.Jordon on 6/14. I had a head cold and fever last week but was better on Friday. Now it has gotten in my throat and I have a cough. I would say let's reschedule but last night my HR went to 130 and has stayed elevated. I cant tell if I am in afib or not I will my bp which also checks for irregular heart rate. Should I take an extra metoporol to bring it down?   Not sure what to do about the appt.    -  Ok. Thanks!   -  You had blood work at [place] in March which covered all that. Not sure we need to repeat labs   Hector Martinique MD, [facility]     Appointment Request From: Hector Clark  With Provider: Peter Martinique, MD [place]  Preferred Date Range: 10/27/2021 - 10/30/2021  Preferred Times: Any Time  Reason for visit: Office Visit  Comments: I think I need to have blood work done before I see Dr. Neita [place]. Please include test for my A1C.

## 2021-10-30 NOTE — ED Notes (Signed)
Pharm Tech at bedside for med rec

## 2021-10-30 NOTE — H&P (Addendum)
Cardiology H&P:   Patient ID: Hector Clark MRN: [id number]; DOB: [birth year]  Admit date: 10/30/2021 Date of Consult: 10/30/2021  PCP:  Lavone Orn, MD   [facility] Providers Cardiologist:  Peter Martinique, MD     Patient Profile:   Hector Clark is a 76 y.o. male with a hx of CAD status post CABG '19 (LIMA to LAD, SVG to OM), persistent atrial fibrillation status post Maze procedure with LAA clip, prediabetes, HFrEF and hyperlipidemia who is being seen 10/30/2021 for the evaluation of atrial flutter at the request of Dr. Francia Greaves.  History of Present Illness:   Hector Clark is a 76 year old male with past medical history noted above.  He has been followed by [name] as an outpatient.  Wore a Holter monitor 01/2017 that showed atrial fibrillation with episodes of PVCs and nonsustained VT versus aberrancy.  Underwent cardioversion 03/2017.  TEE in 06/2017 showed LVEF of 35 to 40%, trace MR and TR.  Presented with STEMI 06/2017 and was taken to the Cath Lab emergently showed critical left main stenosis and occluded circumflex.  He underwent emergent CABG with Dr. Roxy Manns with LIMA to LAD and SVG to OM with maze and LAA clipping.  His Tikosyn was discontinued 10/2017.  He had not had any recurrence of atrial fibrillation following his maze procedure until he noted elevated heart rates on his Fitbit 10/2020.  EKG confirmed atrial flutter with RVR.  He underwent TEE/DCCV on 11/2020, LVEF 35 to 40%.   Last seen in the office on 02/2021 with [name] and reported feeling well.  Had minimal pedal edema.  He was continued on aspirin, Crestor 10 mg daily (statin intolerant), Entresto, beta-blocker and Jardiance added.  Plan was to repeat echo.  This was completed on 02/2021 with LVEF of 55-60%, no rWMA, normal RV, mild MR, mild AS.   Presented to the ED with complaints of tachycardia that started last evening. He noted his HR was elevated on his Fitbit last evening and continued overnight into this  morning. Mildly short of breath with elevated rates. He did not take his morning meds prior to presenting to the ED.   Labs in the ED showed sodium 139, potassium 5, creatinine 1.03, magnesium 2.2, high-sensitivity troponin 7>> 6,  WBC 10.4, hemoglobin 17.1, Hgb A1c 6.7. EKG shows narrow complex tachycardia, appears to be 2:1 atrial flutter rate 134 bpm. He was started on IV diltiazem with little improvement. No complaints at the time of interview.    Past Medical History:  Diagnosis Date   Chest pain    with nagative cardiolite   Gout    occational flare   Hyperlipemia    Persistent atrial fibrillation (HCC)    Peyronie disease    S/P CABG x 2 06/24/2017   LIMA to LAD and SVG to OM with [facility] via right thigh   S/P Maze operation for atrial fibrillation 06/24/2017   Left atrial lesion set using bipolar radiofrequency and cryothermy ablation via conventional median sternotomy with clipping of LA appendage   Visit for monitoring Tikosyn therapy 06/18/2017    Past Surgical History:  Procedure Laterality Date   bone graph     CARDIOVERSION N/A 03/25/2017   Procedure: CARDIOVERSION;  Surgeon: Sueanne Margarita, MD;  Location: [place];  Service: Cardiovascular;  Laterality: N/A;   CARDIOVERSION N/A 11/25/2020   Procedure: CARDIOVERSION;  Surgeon: Buford Dresser, MD;  Location: [place];  Service: Cardiovascular;  Laterality: N/A;   CLIPPING OF ATRIAL APPENDAGE N/A 06/24/2017  Procedure: CLIPPING OF ATRIAL APPENDAGE;  Surgeon: Rexene Alberts, MD;  Location: [place];  Service: Open Heart Surgery;  Laterality: N/A;   CORONARY ARTERY BYPASS GRAFT N/A 06/24/2017   Procedure: CORONARY ARTERY BYPASS GRAFTING (CABG) times two utilizing left anterior mammary artery and right greater saphenous vein harvested endoscopically;  Surgeon: Rexene Alberts, MD;  Location: [place];  Service: Open Heart Surgery;  Laterality: N/A;   IABP INSERTION N/A 06/24/2017   Procedure: IABP Insertion;  Surgeon: Martinique,  Peter M, MD;  Location: [place] CV LAB;  Service: Cardiovascular;  Laterality: N/A;   LEFT HEART CATH AND CORONARY ANGIOGRAPHY N/A 06/24/2017   Procedure: LEFT HEART CATH AND CORONARY ANGIOGRAPHY;  Surgeon: Martinique, Peter M, MD;  Location: [place] CV LAB;  Service: Cardiovascular;  Laterality: N/A;   MAZE N/A 06/24/2017   Procedure: MAZE;  Surgeon: Rexene Alberts, MD;  Location: [place];  Service: Open Heart Surgery;  Laterality: N/A;   TEE WITHOUT CARDIOVERSION N/A 06/24/2017   Procedure: TRANSESOPHAGEAL ECHOCARDIOGRAM (TEE);  Surgeon: Rexene Alberts, MD;  Location: [place];  Service: Open Heart Surgery;  Laterality: N/A;   TEE WITHOUT CARDIOVERSION N/A 11/25/2020   Procedure: TRANSESOPHAGEAL ECHOCARDIOGRAM (TEE);  Surgeon: Buford Dresser, MD;  Location[facility] ENDOSCOPY;  Service: Cardiovascular;  Laterality: N/A;   TOE SURGERY Left    4th toe   tophus       Home Medications:  Prior to Admission medications   Medication Sig Start Date End Date Taking? Authorizing Provider  apixaban (ELIQUIS) 5 MG TABS tablet Take 1 tablet (5 mg total) by mouth 2 (two) times daily. 01/24/21   Deboraha Sprang, MD  cholecalciferol (VITAMIN D) 25 MCG (1000 UNIT) tablet Take 1,000 Units by mouth daily.    [provider]  empagliflozin (JARDIANCE) 10 MG TABS tablet Take 1 tablet (10 mg total) by mouth daily before breakfast. 02/27/21   Martinique, Peter M, MD  furosemide (LASIX) 40 MG tablet Take one-half tablet (20 mg) every morning. 02/27/21   Martinique, Peter M, MD  metoprolol tartrate (LOPRESSOR) 25 MG tablet Take 1 tablet (25 mg total) by mouth 2 (two) times daily. 11/23/20 02/27/21  Deboraha Sprang, MD  Multiple Vitamins-Minerals (ZINC PO) Take 250 mg by mouth daily.    [provider]  rosuvastatin (CRESTOR) 10 MG tablet Take 1 tablet (10 mg total) by mouth daily. 03/20/21   Martinique, Peter M, MD  sacubitril-valsartan (ENTRESTO) 24-26 MG Take 1 tablet by mouth 2 (two) times daily. 12/01/20    Martinique, Peter M, MD  sildenafil (VIAGRA) 25 MG tablet Take 25 mg by mouth daily as needed for erectile dysfunction.    [provider]  vitamin C (ASCORBIC ACID) 500 MG tablet Take 500 mg by mouth daily.    [provider]    Inpatient Medications: Scheduled Meds:  Continuous Infusions:  diltiazem (CARDIZEM) infusion 5 mg/hr (10/30/21 1536)   PRN Meds:   Allergies:   No Known Allergies  Social History:   Social History   Socioeconomic History   Marital status: Married    Spouse name: Not on file   Number of children: Not on file   Years of education: Not on file   Highest education level: Not on file  Occupational History   Not on file  Tobacco Use   Smoking status: Never   Smokeless tobacco: Never  Vaping Use   Vaping Use: Never used  Substance and Sexual Activity   Alcohol use:  Yes    Comment: 1 beer per month   Drug use: No   Sexual activity: Not on file  Other Topics Concern   Not on file  Social History Narrative   Lives in [place]   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   All other ROS reviewed and negative.     Physical Exam/Data:   Vitals:   10/30/21 1445 10/30/21 1500 10/30/21 1515 10/30/21 1530  BP: 94/72 101/83 96/80 102/82  Pulse: (!) 127 (!) 129 (!) 125 (!) 127  Resp: 20 16 (!) 21 (!) 27  Temp:      TempSrc:      SpO2: 98% 100% 98% 97%   No intake or output data in the 24 hours ending 10/30/21 1536    02/27/2021    4:36 PM  02/07/2021    7:50 AM 12/21/2020   11:23 AM  Last 3 Weights  Weight (lbs) 221 lb 222 lb 12.8 oz 222 lb  Weight (kg) 100.245 kg 101.061 kg 100.699 kg     There is no height or weight on file to calculate BMI.  General:  Well nourished, well developed, in no acute distress HEENT: normal Neck: no JVD Vascular: No carotid bruits; Distal pulses 2+ bilaterally Cardiac:  normal S1, S2; tachy; no murmur  Lungs:  clear to auscultation bilaterally, no wheezing, rhonchi or rales  Abd: soft, nontender, no hepatomegaly  Ext: no edema Musculoskeletal:  No deformities, BUE and BLE strength normal and equal Skin: warm and dry  Neuro:  CNs 2-12 intact, no focal abnormalities noted Psych:  Normal affect   EKG:  The EKG was personally reviewed and demonstrates:  suspect atrial flutter 2:1, 134 bpm Telemetry:  Telemetry was personally reviewed and demonstrates:  narrow complex tachycardia, suspect atrial flutter 125 bpm  Relevant CV Studies:  Echo: 02/2021  IMPRESSIONS     1. Left ventricular ejection fraction, by estimation, is 55 to 60%. The  left ventricle has normal function. The left ventricle has no regional  wall motion abnormalities. Left ventricular diastolic parameters are  indeterminate.   2. Right ventricular systolic function is normal. The right ventricular  size is normal. There is normal pulmonary artery systolic pressure. The  estimated right ventricular systolic pressure is 08.6 mmHg.   3. The mitral valve is grossly normal. Mild mitral valve regurgitation.  No evidence of mitral stenosis.   4. The aortic valve is tricuspid. There is mild calcification of the  aortic valve. Aortic valve regurgitation is not visualized. Mild aortic  valve sclerosis is present, with no evidence of aortic valve stenosis.   5. The inferior vena cava is dilated in size with >50% respiratory  variability, suggesting right atrial pressure of 8 mmHg.   Comparison(s): No significant change from prior  study.   FINDINGS   Left Ventricle: Left ventricular ejection fraction, by estimation, is 55  to  60%. The left ventricle has normal function. The left ventricle has no  regional wall motion abnormalities. The left ventricular internal cavity  size was normal in size. There is   no left ventricular hypertrophy. Abnormal (paradoxical) septal motion  consistent with post-operative status. Left ventricular diastolic function  could not be evaluated due to abnormal septal motion. Left ventricular  diastolic parameters are  indeterminate.   Right Ventricle: The right ventricular size is normal. No increase in  right ventricular wall thickness. Right ventricular systolic function is  normal. There is normal pulmonary artery systolic pressure. The tricuspid  regurgitant velocity is 2.31 m/s, and   with an assumed right atrial pressure of 8 mmHg, the estimated right  ventricular systolic pressure is 23.5 mmHg.   Left Atrium: Left atrial size was normal in size.   Right Atrium: Right atrial size was normal in size.   Pericardium: Trivial pericardial effusion is present. Presence of  pericardial fat pad.   Mitral Valve: The mitral valve is grossly normal. Mild mitral valve  regurgitation. No evidence of mitral valve stenosis.   Tricuspid Valve: The tricuspid valve is grossly normal. Tricuspid valve  regurgitation is trivial. No evidence of tricuspid stenosis.   Aortic Valve: The aortic valve is tricuspid. There is mild calcification  of the aortic valve. Aortic valve regurgitation is not visualized. Mild  aortic valve sclerosis is present, with no evidence of aortic valve  stenosis.   Pulmonic Valve: The pulmonic valve was grossly normal. Pulmonic valve  regurgitation is trivial. No evidence of pulmonic stenosis.   Aorta: The aortic root and ascending aorta are structurally normal, with  no evidence of dilitation.   Venous: A systolic blunting flow pattern is recorded from the right  lower  pulmonary vein. The inferior vena cava is dilated in size with greater  than 50% respiratory variability, suggesting right atrial pressure of 8  mmHg.   IAS/Shunts: The atrial septum is grossly normal.   Laboratory Data:  High Sensitivity Troponin:   Recent Labs  Lab 10/30/21 1124 10/30/21 1320  TROPONINIHS 7 6     Chemistry Recent Labs  Lab 10/30/21 1124  NA 139  K 5.0  CL 106  CO2 23  GLUCOSE 162*  BUN 12  CREATININE 1.03  CALCIUM 9.5  MG 2.2  GFRNONAA >60  ANIONGAP 10    No results for input(s): "PROT", "ALBUMIN", "AST", "ALT", "ALKPHOS", "BILITOT" in the last 168 hours. Lipids No results for input(s): "CHOL", "TRIG", "HDL", "LABVLDL", "LDLCALC", "CHOLHDL" in the last 168 hours.  Hematology Recent Labs  Lab 10/30/21 1124  WBC 10.4  RBC 5.73  HGB 17.1*  HCT 53.3*  MCV 93.0  MCH 29.8  MCHC 32.1  RDW 12.8  PLT 292   Thyroid No results for input(s): "TSH", "FREET4" in the last 168 hours.  BNPNo results for input(s): "BNP", "PROBNP" in the last 168 hours.  DDimer No results for input(s): "DDIMER" in the last 168 hours.   Radiology/Studies:  DG Chest 2 View  Result Date: 10/30/2021 CLINICAL DATA:  Shortness of breath. EXAM: CHEST - 2 VIEW COMPARISON:  Chest radiograph Sep 22, 2018. FINDINGS: No consolidation. No visible pleural effusions or pneumothorax. Cardiomediastinal silhouette is unchanged. CABG and left atrial clipping. IMPRESSION: No evidence of acute cardiopulmonary disease. Electronically Signed   By: Margaretha Sheffield M.D.   On: 10/30/2021 11:29     Assessment and Plan:   Hector Clark is a 76 y.o. male with a hx of CAD status post  CABG '19 (LIMA to LAD, SVG to OM), persistent atrial fibrillation status post Maze procedure with LAA clip, prediabetes, HFrEF and hyperlipidemia who is being seen 10/30/2021 for the evaluation of atrial fibrillation at the request of Dr. Francia Greaves.  Atrial flutter with RVR: does have hx of the same, has been on  Eliquis but missed morning dose. Will give dose of Eliquis now, continue on Diltiazem gtt. He may have missed several doses of his Eliquis, therefore will need TEE/DCCV scheduled for 6/14. Will hold NPO at midnight in the event a spot opens -- continue Eliquis '5mg'$  BID, IV diltiazem -- blood pressures are soft, therefore will hold metoprolol for now   CAD s/p CABG (LIMA-LAD, SVG-OM)'19 with MAZE and LAA clipping: no anginal symptoms. Negative troponin -- continue crestor '10mg'$  daily  HFpEF: EF was 40%, but most recently improved to 55-60% on echo 02/2021 -- blood pressures are soft, will hold metoprolol, Entresto, and lasix for now  HLD: continue statin   Risk Assessment/Risk Scores:    CHA2DS2-VASc Score = 3   This indicates a 3.2% annual risk of stroke. The patient's score is based upon: CHF History: 1 HTN History: 0 Diabetes History: 0 (prediabetes) Stroke History: 0 Vascular Disease History: 1 Age Score: 1 Gender Score: 0    For questions or updates, please contact [facility] Please consult [email or web address] for contact info under    Signed, Reino Bellis, NP  10/30/2021 3:36 PM  Personally seen and examined. Agree with APP above with the following comments:  Briefly 76 yo M with a history of CAD s/p CABG, MAZE and LAA Clip 2019,  4 days of Tikoysn with no issues, who presents with AFL RVR.  Patient notes that this is the exact same thing that happened to him last year. When he is in AFL he has no CP, SOB, Palpitations or syncope. Instead he has fatigue and DOE.  He has noted to miss two AM doses of eliquis.  Tele: AFL RVR rare 120 in a diltiazem drip. Prior TEE:  Very small residual LAA on Xplane images  Labs notable for slight increase in WBC. EKG f:1 atypical flutter  For key conditions including  CAD s/p CABG, AFL, acquired thrombophilia  Would recommend  Will plan for TEE/DCCV, we are told that the next availability is 11/01/21 but after discussing with  patient and wife we will keep him NPO tomorrow; if an AM case is dropped he will be the first add on. Will restart eliquis.  Rest as above.  Rudean Haskell, MD Cardiologist [facility] [place] [facility], [address] [place], [place] [address] [phone]  5:17 PM

## 2021-10-30 NOTE — ED Triage Notes (Signed)
Pt c/o waking up in the middle of night with HR 130's denies, denies n/v, denies SHOB at this time but when HR is high feels exertional shob.  Pt HR at this time 70's, BP 116/100.  Did not take Metoprolol this am.

## 2021-10-31 ENCOUNTER — Encounter: Payer: Self-pay | Admitting: Cardiology

## 2021-10-31 LAB — BASIC METABOLIC PANEL
Anion gap: 7 (ref 5–15)
BUN: 15 mg/dL (ref 8–23)
CO2: 26 mmol/L (ref 22–32)
Calcium: 8.9 mg/dL (ref 8.9–10.3)
Chloride: 104 mmol/L (ref 98–111)
Creatinine, Ser: 1.04 mg/dL (ref 0.61–1.24)
GFR, Estimated: >60 mL/min (ref >60)
Glucose, Bld: 127 mg/dL — ABNORMAL HIGH (ref 70–99)
Potassium: 5.3 mmol/L — ABNORMAL HIGH (ref 3.5–5.1)
Sodium: 137 mmol/L (ref 135–145)

## 2021-10-31 LAB — POTASSIUM: Potassium: 4.3 mmol/L (ref 3.5–5.1)

## 2021-10-31 NOTE — TOC Progression Note (Signed)
Transition of Care [facility]) - Progression Note    Patient Details  Name: Hector Clark MRN: [id number] Date of Birth: 1946-03-14  [facility]) CM/SW Contact  Zenon Mayo, RN Phone Number: 10/31/2021, 4:25 PM  Clinical Narrative:    from home , afib with RVR, on cardizem drip, for cardioversion today, for EKG. TOC will cotninue to follow for dc needs.         Expected Discharge Plan and Services                                                 Social Determinants of Health (SDOH) Interventions    Readmission Risk Interventions     No data to display

## 2021-10-31 NOTE — Progress Notes (Addendum)
Progress Note  Patient Name: Hector Clark Date of Encounter: 10/31/2021  [facility] Cardiologist: Peter Martinique, MD   Subjective   No chest pain or dyspnea.   Inpatient Medications    Scheduled Meds:  apixaban  5 mg Oral BID   empagliflozin  10 mg Oral QAC breakfast   rosuvastatin  10 mg Oral Daily   Continuous Infusions:  diltiazem (CARDIZEM) infusion 12.5 mg/hr (10/31/21 0045)   PRN Meds: acetaminophen, ondansetron (ZOFRAN) IV   Vital Signs    Vitals:   10/30/21 2138 10/30/21 2327 10/31/21 0000 10/31/21 0646  BP: 1'00/66 98/62 98/62 '$ 96/63  Pulse: (!) 133  65 65  Resp: (!) '22 14 18 18  '$ Temp: (!) 97.5 F (36.4 C) (!) 97.5 F (36.4 C) 97.7 F (36.5 C) 98.1 F (36.7 C)  TempSrc: Oral Oral Oral Oral  SpO2: 96% 94% 95% 94%  Weight:   99.7 kg 99.7 kg    Intake/Output Summary (Last 24 hours) at 10/31/2021 0904 Last data filed at 10/31/2021 0600 Gross per 24 hour  Intake 418.81 ml  Output --  Net 418.81 ml      10/31/2021    6:46 AM 10/31/2021   12:00 AM 02/27/2021    4:36 PM  Last 3 Weights  Weight (lbs) 219 lb 14.4 oz 219 lb 14.4 oz 221 lb  Weight (kg) 99.745 kg 99.746 kg 100.245 kg      Telemetry    Atrial flutter, rate regular in 60s (? Sinus)- Personally Reviewed  ECG    Pending   Physical Exam   GEN: No acute distress.   Neck: No JVD Cardiac: RRR, no murmurs, rubs, or gallops.  Respiratory: Clear to auscultation bilaterally. GI: Soft, nontender, non-distended  MS: No edema; No deformity. Neuro:  Nonfocal  Psych: Normal affect   Labs    High Sensitivity Troponin:   Recent Labs  Lab 10/30/21 1124 10/30/21 1320  TROPONINIHS 7 6     Chemistry Recent Labs  Lab 10/30/21 1124 10/31/21 0310  NA 139 137  K 5.0 5.3*  CL 106 104  CO2 23 26  GLUCOSE 162* 127*  BUN 12 15  CREATININE 1.03 1.04  CALCIUM 9.5 8.9  MG 2.2  --   GFRNONAA >60 >60  ANIONGAP 10 7    Hematology Recent Labs  Lab 10/30/21 1124  WBC 10.4  RBC 5.73   HGB 17.1*  HCT 53.3*  MCV 93.0  MCH 29.8  MCHC 32.1  RDW 12.8  PLT 292    Radiology    DG Chest 2 View  Result Date: 10/30/2021 CLINICAL DATA:  Shortness of breath. EXAM: CHEST - 2 VIEW COMPARISON:  Chest radiograph Sep 22, 2018. FINDINGS: No consolidation. No visible pleural effusions or pneumothorax. Cardiomediastinal silhouette is unchanged. CABG and left atrial clipping. IMPRESSION: No evidence of acute cardiopulmonary disease. Electronically Signed   By: Margaretha Sheffield M.D.   On: 10/30/2021 11:29    Cardiac Studies   None this admission   Patient Profile     76 y.o. male CAD s/p CABG, persistent atrial fibrillation/atypical flutter status post maze procedure and LAA clip, chronic systolic congestive heart failure, prediabetic and hyperlipidemia presented for evaluation of tachycardia on Fitbit.  Mild shortness of breath.  Assessment & Plan    1.  Atrial flutter with rapid ventricular rate -Prior similar history 11/2020 requiring TEE guided cardioversion. -Noted hypotensive and tachycardic on vital leading to ER evaluation.  Mild symptomatic. -Plan for TEE guided cardioversion on 6/14.  Currently, heart rate improved to 60s on IV diltiazem 12.5 mg/h.  Rate very regular.  Will get EKG to confirm rhythm. -He has missed several doses of Eliquis.  Restarted.  At length discussion regarding daily compliance. -Patient had a cold symptoms last week, could be trigger  2.  CAD s/p CABG -No anginal symptoms -Not on aspirin due to need of anticoagulation -Continue Crestor  3.  Chronic systolic congestive heart failure -Most recent echocardiogram with improved LV function to 55 to 60% on October 2022 -Entresto, metoprolol and Jardiance on hold due to soft blood pressure  4.  Hyperkalemia -Potassium 5.3.  Will recheck lab -Last dose of Entresto p.m. of 6/11  For questions or updates, please contact [place] Please consult [email or web address] for contact info under         SignedLeanor Kail, PA  10/31/2021, 9:04 AM      Personally seen and examined. Agree with APP above with the following comments:  Briefly 76 yo M with PAF with prior MAZE, CAD s/p CABG and HF with recovered EF who presented with AFL.  He has had prior LAA clip no significant residua in 2022, and had missed some eliquis doses.  Has been on AFL RVR and we have planned for TEE/DCCV, weighing the risks of successful occlusion vs mixed DOAC doses prior to DCCV  Patient notes no symptoms; he is disappoint he will not get TEE/DCCV until 11/01/21, but understands No CP,No SOB, No Palpitations, no syncope.  Exam notable for regular rhythm IL diltiazem.  Otherwise as above  Tele: AFL, rate controlled  Would recommend  TEE/DCC on Monday Stop diltiazem 1 hour prior - will likely go home tomorrow post DCCV, on same medication as home potentially with an Entresto hold if low BP or hyper K  Rudean Haskell, MD Cardiologist [facility]  [address], [address] [place], [place] [address] [phone]  11:04 AM

## 2021-11-01 ENCOUNTER — Encounter (HOSPITAL_COMMUNITY)
Admission: EM | Disposition: A | Discharge status: Home / Self Care | Payer: Medicare Other | Source: Home / Self Care | Attending: Internal Medicine

## 2021-11-01 ENCOUNTER — Inpatient Hospital Stay (HOSPITAL_COMMUNITY): Payer: Medicare Other

## 2021-11-01 ENCOUNTER — Ambulatory Visit: Payer: Medicare Other | Admitting: Cardiology

## 2021-11-01 ENCOUNTER — Encounter (HOSPITAL_COMMUNITY): Payer: Self-pay | Admitting: Internal Medicine

## 2021-11-01 ENCOUNTER — Inpatient Hospital Stay (HOSPITAL_COMMUNITY): Payer: Medicare Other | Admitting: Certified Registered"

## 2021-11-01 DIAGNOSIS — I10 Essential (primary) hypertension: Secondary | ICD-10-CM

## 2021-11-01 DIAGNOSIS — I340 Nonrheumatic mitral (valve) insufficiency: Secondary | ICD-10-CM

## 2021-11-01 DIAGNOSIS — I4892 Unspecified atrial flutter: Secondary | ICD-10-CM

## 2021-11-01 DIAGNOSIS — I4891 Unspecified atrial fibrillation: Secondary | ICD-10-CM

## 2021-11-01 DIAGNOSIS — I2510 Atherosclerotic heart disease of native coronary artery without angina pectoris: Secondary | ICD-10-CM

## 2021-11-01 HISTORY — PX: CARDIOVERSION: SHX1299

## 2021-11-01 HISTORY — PX: TEE WITHOUT CARDIOVERSION: SHX5443

## 2021-11-01 SURGERY — CARDIOVERSION
Anesthesia: General

## 2021-11-01 MED ORDER — METOPROLOL TARTRATE 25 MG PO TABS: 12.5000 mg | ORAL_TABLET | Freq: Two times a day (BID) | ORAL | 3 refills | Status: DC

## 2021-11-01 MED ORDER — PROPOFOL 500 MG/50ML IV EMUL
INTRAVENOUS | Status: DC | PRN
  Administered 2021-11-01: 175 ug/kg/min via INTRAVENOUS

## 2021-11-01 MED ORDER — SODIUM CHLORIDE 0.9 % IV SOLN: INTRAVENOUS | Status: DC

## 2021-11-01 MED ORDER — PROPOFOL 10 MG/ML IV BOLUS
INTRAVENOUS | Status: DC | PRN
  Administered 2021-11-01: 10 mg via INTRAVENOUS

## 2021-11-01 NOTE — Transfer of Care (Signed)
Immediate Anesthesia Transfer of Care Note  Patient: Hector Clark  Procedure(s) Performed: CARDIOVERSION TRANSESOPHAGEAL ECHOCARDIOGRAM (TEE)  Patient Location: Endoscopy Unit  Anesthesia Type:MAC  Level of Consciousness: awake, drowsy and patient cooperative  Airway & Oxygen Therapy: Patient Spontanous Breathing and Patient connected to nasal cannula oxygen  Post-op Assessment: Report given to RN, Post -op Vital signs reviewed and stable and Patient moving all extremities X 4  Post vital signs: Reviewed and stable  Last Vitals:  Vitals Value Taken Time  BP    Temp    Pulse    Resp    SpO2      Last Pain:  Vitals:   11/01/21 0850  TempSrc: Temporal  PainSc: 0-No pain         Complications: No notable events documented.

## 2021-11-01 NOTE — Progress Notes (Signed)
  Echocardiogram Echocardiogram Transesophageal has been performed.  Darlina Sicilian M 11/01/2021, 9:48 AM

## 2021-11-01 NOTE — Discharge Summary (Signed)
Discharge Summary    Patient ID: Hector Clark MRN: [id number]; DOB: [birth year]  Admit date: 10/30/2021 Discharge date: 11/01/2021  PCP:  Lavone Orn, MD   [facility] Providers Cardiologist:  Peter Martinique, MD   {  Discharge Diagnoses    Principal Problem:   Atrial flutter [facility]) with rapid ventricular rate CAD s/p CABG Chronic systolic congestive heart failure Hyperkalemia  Diagnostic Studies/Procedures    TEE/DCCV IMPRESSIONS   1. No LAA clot. [place] x 1 with 150 J converted to NSR No immediate  neurologic sequelae On Rx anticoagulation with no missed doses.   2. Left ventricular ejection fraction, by estimation, is 55 to 60%. The  left ventricle has normal function.   3. Right ventricular systolic function is normal. The right ventricular  size is normal.   4. Post MAZE with LA clipping no residual lumen to appendage noted . Left  atrial size was moderately dilated. No left atrial/left atrial appendage  thrombus was detected.   5. Right atrial size was moderately dilated.   6. The mitral valve is normal in structure. Mild mitral valve  regurgitation.   7. The aortic valve is tricuspid. Aortic valve regurgitation is not  visualized.   [place] x 1 150 J converted from fib/flutter to NSR.   History of Present Illness     Hector Clark is a 76 y.o. male with hx of CAD s/p CABG, persistent atrial fibrillation/atypical flutter status post maze procedure and LAA clip, chronic systolic congestive heart failure, prediabetic and hyperlipidemia presented for evaluation of tachycardia on Fitbit and mild shortness of breath who found to have atrial flutter with rapid ventricular rate.  Wore a Holter monitor 01/2017 that showed atrial fibrillation with episodes of PVCs and nonsustained VT versus aberrancy.  Underwent cardioversion 03/2017.  TEE in 06/2017 showed LVEF of 35 to 40%, trace MR and TR.  Presented with STEMI 06/2017 and was taken to the Cath Lab emergently showed  critical left main stenosis and occluded circumflex.  He underwent emergent CABG with Dr. Roxy Manns with LIMA to LAD and SVG to OM with maze and LAA clipping.  His Tikosyn was discontinued 10/2017.  He had not had any recurrence of atrial fibrillation following his maze procedure until he noted elevated heart rates on his Fitbit 10/2020.  EKG confirmed atrial flutter with RVR.  He underwent TEE/DCCV on 11/2020, LVEF 35 to 40%.    Last echocardiogram 02/2021 with LVEF of 55-60%, no rWMA, normal RV, mild MR, mild AS.   Presented to the ED with complaints of tachycardia. He noted his HR was elevated on his Fitbit last evening and continued overnight.  Mildly short of breath with elevated rates. He did not take his morning meds prior to presenting to the ED.    Labs in the ED showed sodium 139, potassium 5, creatinine 1.03, magnesium 2.2, high-sensitivity troponin 7>> 6,  WBC 10.4, hemoglobin 17.1, Hgb A1c 6.7. EKG shows narrow complex tachycardia, appears to be 2:1 atrial flutter rate 134 bpm. He was started on IV diltiazem with little improvement.   Hospital Course     Consultants: None   1.  Atrial flutter with rapid ventricular rate Patient was symptomatic with hypotension and shortness of breath.  Symptoms were mild.  He was started on IV Cardizem with improved heart rate.  He misses several doses of Eliquis prior to admission.  At length discussion regarding compliance.  Patient underwent successful TEE guided cardioversion with restoration of sinus rhythm.  Given soft blood pressure  he was recommended to reduce metoprolol at half dose at 12.5 mg twice daily.  Continue Eliquis for anticoagulation.  2.  Chronic systolic congestive heart failure -Patient was euvolemic on exam.  No heart failure symptoms. -His Entresto and metoprolol were hold during admission given soft blood pressure. -At discharge recommended to start metoprolol at half dose at 12.5 mg twice daily.  Continue to hold Entresto.  He was  recommended to keep a log of blood pressure and bring during office visit next week. -Continue Jardiance  3.  Hyperkalemia -Potassium was 5.3.  Entresto held as above.  At discharge potassium was 4.3. -Will need BMP if restarted Entresto  4.  CAD s/p CABG -No anginal symptoms -Not on aspirin due to need of anticoagulation -Continue Crestor  Did the patient have an acute coronary syndrome (MI, NSTEMI, STEMI, etc) this admission?:  No                               Did the patient have a percutaneous coronary intervention (stent / angioplasty)?:  No.    Discharge Vitals Blood pressure 109/68, pulse 65, temperature (!) 97.3 F (36.3 C), temperature source Oral, resp. rate 16, height '6\' 2"'$  (1.88 m), weight 98.9 kg, SpO2 98 %.  Filed Weights   10/31/21 0646 11/01/21 0220 11/01/21 0850  Weight: 99.7 kg 99.2 kg 98.9 kg    Labs & Radiologic Studies    CBC Recent Labs    10/30/21 1124  WBC 10.4  NEUTROABS 7.6  HGB 17.1*  HCT 53.3*  MCV 93.0  PLT 846   Basic Metabolic Panel Recent Labs    10/30/21 1124 10/31/21 0310 10/31/21 0904  NA 139 137  --   K 5.0 5.3* 4.3  CL 106 104  --   CO2 23 26  --   GLUCOSE 162* 127*  --   BUN 12 15  --   CREATININE 1.03 1.04  --   CALCIUM 9.5 8.9  --   MG 2.2  --   --     High Sensitivity Troponin:   Recent Labs  Lab 10/30/21 1124 10/30/21 1320  TROPONINIHS 7 6    _____________  ECHO TEE  Result Date: 11/01/2021    TRANSESOPHOGEAL ECHO REPORT   Patient Name:   Hector Clark Date of Exam: 11/01/2021 Medical Rec #:  [id number]         Height:       74.0 in Accession #:    [id number]        Weight:       218.0 lb Date of Birth:  Jul 29, 1945         BSA:          2.255 m Patient Age:    5 years          BP:           124/63 mmHg Patient Gender: M                 HR:           64 bpm. Exam Location:  Inpatient Procedure: Transesophageal Echo, Cardiac Doppler and Color Doppler Indications:     Atrial fibrillation I48.91  History:          Patient has prior history of Echocardiogram examinations, most                  recent 03/13/2021. CHF, CAD, Prior CABG,  Arrythmias:Atrial                  Fibrillation; Signs/Symptoms:Chest Pain.  Sonographer:     Darlina Sicilian RDCS Referring Phys:  [place] Diagnosing Phys: Jenkins Rouge MD PROCEDURE: After discussion of the risks and benefits of a TEE, an informed consent was obtained from the patient. TEE procedure time was 4 minutes. The transesophogeal probe was passed without difficulty through the esophogus of the patient. Imaged were  obtained with the patient in a left lateral decubitus position. Sedation performed by different physician. The patient was monitored while under deep sedation. Anesthestetic sedation was provided intravenously by Anesthesiology: 183.'08mg'$  of Propofol. The patient's vital signs; including heart rate, blood pressure, and oxygen saturation; remained stable throughout the procedure. The patient developed no complications during the procedure. A successful direct current cardioversion was performed at 150 joules with 1 attempt. IMPRESSIONS  1. No LAA clot. [place] x 1 with 150 J converted to NSR No immediate neurologic sequelae On Rx anticoagulation with no missed doses.  2. Left ventricular ejection fraction, by estimation, is 55 to 60%. The left ventricle has normal function.  3. Right ventricular systolic function is normal. The right ventricular size is normal.  4. Post MAZE with LA clipping no residual lumen to appendage noted . Left atrial size was moderately dilated. No left atrial/left atrial appendage thrombus was detected.  5. Right atrial size was moderately dilated.  6. The mitral valve is normal in structure. Mild mitral valve regurgitation.  7. The aortic valve is tricuspid. Aortic valve regurgitation is not visualized. FINDINGS  Left Ventricle: Left ventricular ejection fraction, by estimation, is 55 to 60%. The left ventricle has normal function. The left  ventricular internal cavity size was normal in size. Right Ventricle: The right ventricular size is normal. Right vetricular wall thickness was not assessed. Right ventricular systolic function is normal. Left Atrium: Post MAZE with LA clipping no residual lumen to appendage noted. Left atrial size was moderately dilated. No left atrial/left atrial appendage thrombus was detected. Right Atrium: Right atrial size was moderately dilated. Pericardium: There is no evidence of pericardial effusion. Mitral Valve: The mitral valve is normal in structure. Mild mitral valve regurgitation. Tricuspid Valve: The tricuspid valve is normal in structure. Tricuspid valve regurgitation is mild. Aortic Valve: The aortic valve is tricuspid. Aortic valve regurgitation is not visualized. Pulmonic Valve: The pulmonic valve was normal in structure. Pulmonic valve regurgitation is trivial. Aorta: The aortic root is normal in size and structure. IAS/Shunts: No atrial level shunt detected by color flow Doppler. Additional Comments: No LAA clot. [place] x 1 with 150 J converted to NSR No immediate neurologic sequelae On Rx anticoagulation with no missed doses. Jenkins Rouge MD Electronically signed by Jenkins Rouge MD Signature Date/Time: 11/01/2021/11:43:39 AM    Final    DG Chest 2 View  Result Date: 10/30/2021 CLINICAL DATA:  Shortness of breath. EXAM: CHEST - 2 VIEW COMPARISON:  Chest radiograph Sep 22, 2018. FINDINGS: No consolidation. No visible pleural effusions or pneumothorax. Cardiomediastinal silhouette is unchanged. CABG and left atrial clipping. IMPRESSION: No evidence of acute cardiopulmonary disease. Electronically Signed   By: Margaretha Sheffield M.D.   On: 10/30/2021 11:29   Disposition   Pt is being discharged home today in good condition.  Follow-up Plans & Appointments     Follow-up Information     Deberah Pelton, NP Follow up on 11/08/2021.   Specialty: Cardiology Why: '@10'$ :05am for hospital follow up  Contact  information: [address] [place] [address] [phone]                Discharge Instructions     Diet - low sodium heart healthy   Complete by: As directed    Discharge instructions   Complete by: As directed    Keep a log of blood pressure and bring during office visit for review.  If blood pressure persistently above 140/90, call office with direction or restart Entresto.   Increase activity slowly   Complete by: As directed        Discharge Medications   Allergies as of 11/01/2021       Reactions   Atorvastatin Other (See Comments)   Other reaction(s): Muscle pain        Medication List     STOP taking these medications    Entresto 24-26 MG Generic drug: sacubitril-valsartan   furosemide 40 MG tablet Commonly known as: LASIX       TAKE these medications    apixaban 5 MG Tabs tablet Commonly known as: ELIQUIS Take 1 tablet (5 mg total) by mouth 2 (two) times daily.   cholecalciferol 25 MCG (1000 UNIT) tablet Commonly known as: VITAMIN D Take 1,000 Units by mouth daily.   empagliflozin 10 MG Tabs tablet Commonly known as: Jardiance Take 1 tablet (10 mg total) by mouth daily before breakfast.   metoprolol tartrate 25 MG tablet Commonly known as: LOPRESSOR Take 0.5 tablets (12.5 mg total) by mouth 2 (two) times daily. What changed: how much to take   rosuvastatin 10 MG tablet Commonly known as: CRESTOR Take 1 tablet (10 mg total) by mouth daily.   sildenafil 25 MG tablet Commonly known as: VIAGRA Take 25 mg by mouth daily as needed for erectile dysfunction.   vitamin C 500 MG tablet Commonly known as: ASCORBIC ACID Take 500 mg by mouth daily.   ZINC PO Take 250 mg by mouth daily.         Outstanding Labs/Studies   None  Duration of Discharge Encounter   Greater than 30 minutes including physician time.  Jarrett Soho, PA 11/01/2021, 12:16 PM

## 2021-11-01 NOTE — Anesthesia Preprocedure Evaluation (Signed)
Anesthesia Evaluation  Patient identified by MRN, date of birth, ID band Patient awake    Reviewed: Allergy & Precautions, NPO status , Patient's Chart, lab work & pertinent test results, reviewed documented beta blocker date and time   History of Anesthesia Complications Negative for: history of anesthetic complications  Airway Mallampati: III  TM Distance: >3 FB Neck ROM: Full    Dental  (+) Teeth Intact, Chipped, Dental Advisory Given,    Pulmonary neg pulmonary ROS,    breath sounds clear to auscultation       Cardiovascular hypertension, Pt. on medications and Pt. on home beta blockers + CAD and + Past MI  + dysrhythmias Atrial Fibrillation  Rhythm:Irregular Rate:Normal  1. Left ventricular ejection fraction, by estimation, is 55 to 60%. The  left ventricle has normal function. The left ventricle has no regional  wall motion abnormalities. Left ventricular diastolic parameters are  indeterminate.  2. Right ventricular systolic function is normal. The right ventricular  size is normal. There is normal pulmonary artery systolic pressure. The  estimated right ventricular systolic pressure is 76.2 mmHg.  3. The mitral valve is grossly normal. Mild mitral valve regurgitation.  No evidence of mitral stenosis.  4. The aortic valve is tricuspid. There is mild calcification of the  aortic valve. Aortic valve regurgitation is not visualized. Mild aortic  valve sclerosis is present, with no evidence of aortic valve stenosis.  5. The inferior vena cava is dilated in size with >50% respiratory  variability, suggesting right atrial pressure of 8 mmHg.    ? The left ventricular ejection fraction is moderately decreased (30-44%). ? Nuclear stress EF: 41%. ? This is an intermediate risk study.   Abnormal, intermediate risk stress nuclear study with no ischemia or infarction.  Gated ejection fraction 41% with global hypokinesis and  mild left ventricular enlargement.  Study interpreted as intermediate risk due to reduced LV function.  ? Prox RCA lesion is 25% stenosed. ? Mid RCA lesion is 30% stenosed. ? Mid LM lesion is 99% stenosed. ? Ost Cx to Prox Cx lesion is 99% stenosed. ? LV end diastolic pressure is moderately elevated. ? There is moderate left ventricular systolic dysfunction. ? The left ventricular ejection fraction is 35-45% by visual estimate.   1. Critical left main and LCx occlusive CAD  2. Moderate LV dysfunction 3. Moderately elevated LVEDP  Plan: IABP support. Recommend emergent CABG. Reviewed with Dr. Roxy Manns and patient will be taken emergently to OR.     Neuro/Psych negative neurological ROS  negative psych ROS   GI/Hepatic negative GI ROS,   Endo/Other  Hyperlipidemia Gout  Renal/GU negative Renal ROS     Musculoskeletal negative musculoskeletal ROS (+)   Abdominal   Peds  Hematology HLD eliquis  Lab Results      Component                Value               Date                      WBC                      10.4                10/30/2021                HGB  17.1 (H)            10/30/2021                HCT                      53.3 (H)            10/30/2021                MCV                      93.0                10/30/2021                PLT                      292                 10/30/2021              Anesthesia Other Findings   Reproductive/Obstetrics Peyronie's Disease                             Anesthesia Physical Anesthesia Plan  ASA: 3  Anesthesia Plan: MAC and General   Post-op Pain Management: Minimal or no pain anticipated   Induction:   PONV Risk Score and Plan: 2 and Propofol infusion  Airway Management Planned: Nasal Cannula, Natural Airway and Simple Face Mask  Additional Equipment: None  Intra-op Plan:   Post-operative Plan:   Informed Consent: I have reviewed the patients History and  Physical, chart, labs and discussed the procedure including the risks, benefits and alternatives for the proposed anesthesia with the patient or authorized representative who has indicated his/her understanding and acceptance.     Dental advisory given  Plan Discussed with: CRNA  Anesthesia Plan Comments:         Anesthesia Quick Evaluation

## 2021-11-01 NOTE — TOC Transition Note (Signed)
Transition of Care [facility]) - CM/SW Discharge Note   Patient Details  Name: Hector Clark MRN: [id number] Date of Birth: 1945-05-31  [facility] of [facility] CM/SW Contact:  Zenon Mayo, RN Phone Number: 11/01/2021, 1:12 PM   Clinical Narrative:    Patient is for dc today, his wife is at the bedside who will transport him home.  He has no needs.          Patient Goals and CMS Choice        Discharge Placement                       Discharge Plan and Services                                     Social Determinants of Health (SDOH) Interventions     Readmission Risk Interventions     No data to display

## 2021-11-01 NOTE — Progress Notes (Addendum)
Progress Note  Patient Name: Hector Clark Date of Encounter: 11/01/2021  [facility] Cardiologist: Peter Martinique, MD   Subjective   Feeling well. No chest pain, sob or palpitations.  For TEE/DCCV today.   Inpatient Medications    Scheduled Meds:  apixaban  5 mg Oral BID   empagliflozin  10 mg Oral QAC breakfast   rosuvastatin  10 mg Oral Daily   Continuous Infusions:  diltiazem (CARDIZEM) infusion 12.5 mg/hr (11/01/21 0733)   PRN Meds: acetaminophen, ondansetron (ZOFRAN) IV   Vital Signs    Vitals:   10/31/21 1127 10/31/21 2025 11/01/21 0220 11/01/21 0421  BP: 92/69 105/75  108/75  Pulse:  65  65  Resp: '20 18  18  '$ Temp: 97.6 F (36.4 C) 98 F (36.7 C)  98 F (36.7 C)  TempSrc: Oral Oral  Oral  SpO2: 98% 95%  95%  Weight:   99.2 kg     Intake/Output Summary (Last 24 hours) at 11/01/2021 0803 Last data filed at 10/31/2021 1700 Gross per 24 hour  Intake 480 ml  Output 700 ml  Net -220 ml      11/01/2021    2:20 AM 10/31/2021    6:46 AM 10/31/2021   12:00 AM  Last 3 Weights  Weight (lbs) 218 lb 9.6 oz 219 lb 14.4 oz 219 lb 14.4 oz  Weight (kg) 99.156 kg 99.745 kg 99.746 kg      Telemetry    Atrial flutter with controlled rate - Personally Reviewed  ECG    N/A  Physical Exam   GEN: No acute distress.   Neck: No JVD Cardiac: RRR, no murmurs, rubs, or gallops.  Respiratory: Clear to auscultation bilaterally. GI: Soft, nontender, non-distended  MS: No edema; No deformity. Neuro:  Nonfocal  Psych: Normal affect   Labs    High Sensitivity Troponin:   Recent Labs  Lab 10/30/21 1124 10/30/21 1320  TROPONINIHS 7 6     Chemistry Recent Labs  Lab 10/30/21 1124 10/31/21 0310 10/31/21 0904  NA 139 137  --   K 5.0 5.3* 4.3  CL 106 104  --   CO2 23 26  --   GLUCOSE 162* 127*  --   BUN 12 15  --   CREATININE 1.03 1.04  --   CALCIUM 9.5 8.9  --   MG 2.2  --   --   GFRNONAA >60 >60  --   ANIONGAP 10 7  --    Hematology Recent Labs   Lab 10/30/21 1124  WBC 10.4  RBC 5.73  HGB 17.1*  HCT 53.3*  MCV 93.0  MCH 29.8  MCHC 32.1  RDW 12.8  PLT 292     Radiology    DG Chest 2 View  Result Date: 10/30/2021 CLINICAL DATA:  Shortness of breath. EXAM: CHEST - 2 VIEW COMPARISON:  Chest radiograph Sep 22, 2018. FINDINGS: No consolidation. No visible pleural effusions or pneumothorax. Cardiomediastinal silhouette is unchanged. CABG and left atrial clipping. IMPRESSION: No evidence of acute cardiopulmonary disease. Electronically Signed   By: Margaretha Sheffield M.D.   On: 10/30/2021 11:29    Cardiac Studies   N/A  Patient Profile     76 y.o. male with hx of CAD s/p CABG, persistent atrial fibrillation/atypical flutter status post maze procedure and LAA clip, chronic systolic congestive heart failure, prediabetic and hyperlipidemia presented for evaluation of tachycardia on Fitbit. Mild shortness of breath.  Assessment & Plan    1.  Atrial flutter with  rapid ventricular rate -Prior similar history 11/2020 requiring TEE guided cardioversion. -Noted hypotensive and tachycardic on vital leading to ER evaluation.  Mild symptomatic. - Heart rate improved to 60s on IV diltiazem 12.5 mg/h.   -He has missed several doses of Eliquis.  Restarted.  At length discussion regarding daily compliance. -Patient had a cold symptoms last week, could be trigger - -Plan for TEE guided cardioversion later today.   Shared Decision Making/Informed Consent The risks [stroke, cardiac arrhythmias rarely resulting in the need for a temporary or permanent pacemaker, skin irritation or burns, esophageal damage, perforation (1:10,000 risk), bleeding, pharyngeal hematoma as well as other potential complications associated with conscious sedation including aspiration, arrhythmia, respiratory failure and death], benefits (treatment guidance, restoration of normal sinus rhythm, diagnostic support) and alternatives of a transesophageal echocardiogram guided  cardioversion were discussed in detail with Mr. Valadez and he is willing to proceed.    2.  CAD s/p CABG -No anginal symptoms -Not on aspirin due to need of anticoagulation -Continue Crestor   3.  Chronic systolic congestive heart failure -Most recent echocardiogram with improved LV function to 55 to 60% on October 2022 -Continue Jardiance - Home metoprolol and Entresto on hold due to soft blood pressure >>> now improving.   4.  Hyperkalemia -Potassium 5.3.  Recheck lab with K of 4.3 -Last dose of Entresto p.m. of 6/11  Likely DC home today after cardioversion. MD to review discharge meds.   For questions or updates, please contact [facility] Please consult [email or web address] for contact info under        SignedLeanor Kail, PA  11/01/2021, 8:03 AM     Personally seen and examined. Agree with APP above with the following comments: Did well post TEE/DCCV BP still soft.  He is asymptomatic Has discussed be tired and weak at home.  We will decrease metoprolol dose to 12.5 mg PO BID. Will hold Entresto. Will work on follow up to re-initiation.  If he had return of AFL we have discussed Tikosyn loading vs ablation and would need EP f/u Discharge later today  Rudean Haskell, MD Cardiologist [facility] [place]  [address], [address] [place], [place] [address] [phone]  12:21 PM

## 2021-11-01 NOTE — CV Procedure (Signed)
TEE/DCC: Anesthesia:  [place] with Propofol  EF 60% Mild MR AV sclerosis LAA clipped with no thrombus Normal RV ? Small PFO No effusion  Mild TR  DCC x 1 150 J converted from fib/flutter to NSR  No immediate neurologic sequelae On Eliquis   Jenkins Rouge MD [facility]

## 2021-11-01 NOTE — Progress Notes (Signed)
Mobility Specialist Progress Note:   11/01/21 1233  Mobility  Activity Ambulated with assistance in room  Level of Assistance Independent after set-up  Assistive Device None  Distance Ambulated (ft) 80 ft  Activity Response Tolerated well  $Mobility charge 1 Mobility   Pt received in bed willing to participate in mobility. No complaints of pain. Left in bed with call bell in reach and all needs met.   [facility] Trinetta Alemu Mobility Specialist

## 2021-11-02 ENCOUNTER — Encounter (HOSPITAL_COMMUNITY): Payer: Self-pay | Admitting: Cardiovascular Disease

## 2021-11-02 NOTE — Anesthesia Postprocedure Evaluation (Signed)
Anesthesia Post Note  Patient: Hector Clark  Procedure(s) Performed: CARDIOVERSION TRANSESOPHAGEAL ECHOCARDIOGRAM (TEE)     Patient location during evaluation: Endoscopy Level of consciousness: awake and alert Pain management: pain level controlled Vital Signs Assessment: post-procedure vital signs reviewed and stable Respiratory status: spontaneous breathing, nonlabored ventilation and respiratory function stable Cardiovascular status: stable Postop Assessment: no apparent nausea or vomiting Anesthetic complications: no   No notable events documented.  Last Vitals:  Vitals:   11/01/21 1005 11/01/21 1034  BP: (!) 111/59 109/68  Pulse: 62 65  Resp: 18 16  Temp:  (!) 36.3 C  SpO2: 95% 98%    Last Pain:  Vitals:   11/01/21 1034  TempSrc: Oral  PainSc: 0-No pain                 Alnisa Hasley

## 2021-11-06 NOTE — Progress Notes (Unsigned)
Cardiology Clinic Note   Patient Name: Hector Clark Date of Encounter: 11/08/2021  Primary Care Provider:  Lavone Orn, MD Primary Cardiologist:  Peter Martinique, MD  Patient Profile    Hector Clark 76 year old male presents to the clinic today for follow-up evaluation of his persistent atrial fibrillation and coronary artery disease.  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   LIMA to LAD and SVG to OM with [facility] via right thigh   S/P Maze operation for atrial fibrillation 06/24/2017   Left atrial lesion set using bipolar radiofrequency and cryothermy ablation via conventional median sternotomy with clipping of LA appendage   Visit for monitoring Tikosyn therapy 06/18/2017   Past Surgical History:  Procedure Laterality Date   bone graph     CARDIOVERSION N/A 03/25/2017   Procedure: CARDIOVERSION;  Surgeon: Sueanne Margarita, MD;  Location: [place];  Service: Cardiovascular;  Laterality: N/A;   CARDIOVERSION N/A 11/25/2020   Procedure: CARDIOVERSION;  Surgeon: Buford Dresser, MD;  Location: [place];  Service: Cardiovascular;  Laterality: N/A;   CARDIOVERSION N/A 11/01/2021   Procedure: CARDIOVERSION;  Surgeon: Josue Hector, MD;  Location: [place];  Service: Cardiovascular;  Laterality: N/A;   CLIPPING OF ATRIAL APPENDAGE N/A 06/24/2017   Procedure: CLIPPING OF ATRIAL APPENDAGE;  Surgeon: Rexene Alberts, MD;  Location: [place];  Service: Open Heart Surgery;  Laterality: N/A;   CORONARY ARTERY BYPASS GRAFT N/A 06/24/2017   Procedure: CORONARY ARTERY BYPASS GRAFTING (CABG) times two utilizing left anterior mammary artery and right greater saphenous vein harvested endoscopically;  Surgeon: Rexene Alberts, MD;  Location: [place];  Service: Open Heart Surgery;  Laterality:  N/A;   IABP INSERTION N/A 06/24/2017   Procedure: IABP Insertion;  Surgeon: Martinique, Peter M, MD;  Location: [place] [facility];  Service: Cardiovascular;  Laterality: N/A;   LEFT HEART CATH AND CORONARY ANGIOGRAPHY N/A 06/24/2017   Procedure: LEFT HEART CATH AND CORONARY ANGIOGRAPHY;  Surgeon: Martinique, Peter M, MD;  Location: [place] [facility];  Service: Cardiovascular;  Laterality: N/A;   MAZE N/A 06/24/2017   Procedure: MAZE;  Surgeon: Rexene Alberts, MD;  Location: [place];  Service: Open Heart Surgery;  Laterality: N/A;   TEE WITHOUT CARDIOVERSION N/A 06/24/2017   Procedure: TRANSESOPHAGEAL ECHOCARDIOGRAM (TEE);  Surgeon: Rexene Alberts, MD;  Location: [place];  Service: Open Heart Surgery;  Laterality: N/A;   TEE WITHOUT CARDIOVERSION N/A 11/25/2020   Procedure: TRANSESOPHAGEAL ECHOCARDIOGRAM (TEE);  Surgeon: Buford Dresser, MD;  Location: [facility] ENDOSCOPY;  Service: Cardiovascular;  Laterality: N/A;   TEE WITHOUT CARDIOVERSION N/A 11/01/2021   Procedure: TRANSESOPHAGEAL ECHOCARDIOGRAM (TEE);  Surgeon: Josue Hector, MD;  Location[facility] ENDOSCOPY;  Service: Cardiovascular;  Laterality: N/A;   TOE SURGERY Left    4th toe   tophus      Allergies  Allergies  Allergen Reactions   Atorvastatin Other (See Comments)    Other reaction(s): Muscle pain    History of Present Illness    Hector Clark has a PMH of atrial fibrillation, coronary artery disease status post CABG x2 LIMA-LAD and SVG-OM with maze and LAA clipping 2/19, NSTEMI, HTN, hyperlipidemia, and leg cramps.  His Tikosyn was discontinued 6/19.  He had not  had a recurrence of atrial fibrillation since his maze procedure until an event was noted via Fitbit 6/22.  An EKG confirmed atrial flutter with RVR.  He underwent TEE and DCCV on 7/22.  His LVEF was noted to be 35-40%.  His echocardiogram 10/22 showed an LVEF of 55-60%, mild MR, mild AS.  He presents to the emergency department with complaints of tachycardia.  He again noted his  heart rate was elevated on his Fitbit.  He was mildly short of breath with elevated rates.  He did not take his morning medications prior to presenting to the emergency department.  His EKG showed narrow complex tachycardia to the 1 atrial flutter with a rate of 134 bpm.  He was started on IV diltiazem and did not have significant improvement.  He underwent TEE cardioversion on 11/01/2020.  He received 1 shock with 150 J.  He tolerated procedure well.  He presents to the clinic today for follow-up evaluation and states he feels well today.  We reviewed his cardioversion.  He is cardiac unaware.  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  He regularly monitors his Fitbit.  He did note that when he had atrial fibrillation his blood pressure also decreased and his fatigue increased.  He also noted that he had missed a few doses of his medication.  We reviewed the importance of avoiding triggers.  I will have him increase his physical activity, give the salty 6 diet sheet, continue his current medication regimen, repeat BMP in 2 weeks (will resume Entresto at that time based on K) and plan follow-up as scheduled.  Today he denies chest pain, shortness of breath, lower extremity edema, fatigue, palpitations, melena, hematuria, hemoptysis, diaphoresis, weakness, presyncope, syncope, orthopnea, and PND.     Home Medications    Prior to Admission medications   Medication Sig Start Date End Date Taking? Authorizing Provider  apixaban (ELIQUIS) 5 MG TABS tablet Take 1 tablet (5 mg total) by mouth 2 (two) times daily. 01/24/21   Deboraha Sprang, MD  cholecalciferol (VITAMIN D) 25 MCG (1000 UNIT) tablet Take 1,000 Units by mouth daily.    [provider]  empagliflozin (JARDIANCE) 10 MG TABS tablet Take 1 tablet (10 mg total) by mouth daily before breakfast. 02/27/21   Martinique, Peter M, MD  metoprolol tartrate (LOPRESSOR) 25 MG tablet Take 0.5 tablets (12.5 mg total) by mouth 2  (two) times daily. 11/01/21   Bhagat, Crista Luria, PA  Multiple Vitamins-Minerals (ZINC PO) Take 250 mg by mouth daily.    [provider]  rosuvastatin (CRESTOR) 10 MG tablet Take 1 tablet (10 mg total) by mouth daily. 03/20/21   Martinique, Peter M, MD  sildenafil (VIAGRA) 25 MG tablet Take 25 mg by mouth daily as needed for erectile dysfunction.    [provider]  vitamin C (ASCORBIC ACID) 500 MG tablet Take 500 mg by mouth daily.    [provider]    Family History    Family History  Problem Relation Age of Onset   Liver cancer Mother 35   Esophageal cancer Father 16   Healthy Daughter    Heart disease Neg Hx    He indicated that his mother is deceased. He indicated that his father is deceased. He indicated that his daughter is alive. He indicated that the status of his neg hx is unknown.  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  Cardiovascular:  No chest pain, dyspnea on exertion, edema, orthopnea, palpitations, paroxysmal nocturnal  dyspnea. Dermatological: No rash, lesions/masses Respiratory: No cough, dyspnea Urologic: No hematuria, dysuria Abdominal:   No nausea, vomiting, diarrhea, bright red blood per rectum, melena, or hematemesis Neurologic:  No visual changes, wkns, changes in mental status. All other systems reviewed and are otherwise negative except as noted above.  Physical Exam    VS:  BP 112/70 (BP Location: Left Arm)   Pulse (!) 57   Ht 6' 1.5" (1.867 m)   Wt 226 lb (102.5 kg)   SpO2 97%   BMI 29.41 kg/m  , BMI Body mass index is 29.41 kg/m. GEN: Well nourished, well developed, in no acute distress. HEENT: normal. Neck: Supple, no JVD, carotid bruits, or masses. Cardiac: RRR, no murmurs, rubs, or gallops. No clubbing, cyanosis, generalized bilateral lower extremity nonpitting edema.  Radials/DP/PT 2+ and equal bilaterally.  Respiratory:  Respirations regular and unlabored, clear to auscultation bilaterally. GI: Soft, nontender, nondistended, BS + x 4. MS: no deformity or atrophy. Skin: warm and dry, no rash. Neuro:  Strength and sensation are intact. Psych: Normal affect.  Accessory Clinical Findings    Recent Labs: 12/13/2020: ALT 17 10/30/2021: Hemoglobin 17.1; Magnesium 2.2; Platelets 292 10/31/2021: BUN 15; Creatinine, Ser 1.04; Potassium 4.3; Sodium 137   Recent Lipid Panel    Component Value Date/Time   CHOL 115 12/13/2020 0827   TRIG 125 12/13/2020 0827   HDL 41 12/13/2020 0827   CHOLHDL 2.8 12/13/2020 0827   LDLCALC 52 12/13/2020 0827    ECG personally reviewed by me today-sinus bradycardia first-degree AV block left bundle branch block 57 bpm.    TEE 11/01/2021  IMPRESSIONS     1. No LAA clot. [place] x 1 with 150 J converted to NSR No immediate  neurologic sequelae On Rx anticoagulation with no missed doses.   2. Left ventricular ejection fraction, by estimation, is 55 to 60%. The  left ventricle has normal function.   3. Right ventricular systolic function is normal.  The right ventricular  size is normal.   4. Post MAZE with LA clipping no residual lumen to appendage noted . Left  atrial size was moderately dilated. No left atrial/left atrial appendage  thrombus was detected.   5. Right atrial size was moderately dilated.   6. The mitral valve is normal in structure.  Mild mitral valve  regurgitation.   7. The aortic valve is tricuspid. Aortic valve regurgitation is not  visualized.   Assessment & Plan   1.  Atrial flutter/atrial fibrillation-EKG today shows sinus bradycardia with first-degree AV block bundle branch block 57 bpm.  Status post successful TEE and cardioversion 11/01/2021.  Denies episodes of accelerated or irregular heartbeat.  Reports compliance with apixaban and denies bleeding issues. Continue apixaban, metoprolol Heart healthy low-sodium diet-salty 6 given Increase physical activity as tolerated Avoid triggers caffeine, chocolate, EtOH, dehydration etc.  Coronary artery disease-no chest pain today.  Status post CABG 2019 with two-vessel and maze, and LAA clipping.  His Tikosyn was discontinued 6/19.  Echocardiogram 10/22 showed an increase to LVEF to 55-60%, mild MR and mild AS. Continue metoprolol, rosuvastatin Heart healthy low-sodium diet-salty 6 given Increase physical activity as tolerated  Chronic systolic CHF-no increased DOE or activity intolerance.  TEE showed LVEF of 55-60%.  Details above.  Delene Loll was held during hospitalization due to hyperkalemia.  Plan to restart Entresto if potassium allows. Continue metoprolol Heart healthy low-sodium diet-salty 6 given Increase physical activity as tolerated Order BMP today and in 2 weeks.  Hyperlipidemia-LDL 61. Continue rosuvastatin Heart healthy low-sodium high-fiber diet Increase physical activity as tolerated  Disposition: Follow-up with [name] or me as scheduled.   Jossie Ng. Vaden Becherer NP-C    11/08/2021, 10:22 AM [place] [address] Office [phone] Fax [phone]  Notice: This dictation was prepared with Dragon dictation along with smaller phrase technology. Any transcriptional errors that result from this process are unintentional and may not be corrected upon review.  I spent 14 minutes examining this patient, reviewing medications, and using patient centered shared decision making involving her cardiac care.  Prior to her visit I spent greater than 20 minutes reviewing her past medical history,  medications, and prior cardiac tests.

## 2021-11-08 ENCOUNTER — Encounter: Payer: Self-pay | Admitting: General Practice

## 2021-11-08 ENCOUNTER — Ambulatory Visit (INDEPENDENT_AMBULATORY_CARE_PROVIDER_SITE_OTHER): Payer: Medicare Other | Admitting: General Practice

## 2021-11-08 VITALS — BP 112/70 | HR 57 | Ht 73.5 in | Wt 226.0 lb

## 2021-11-08 DIAGNOSIS — I25810 Atherosclerosis of coronary artery bypass graft(s) without angina pectoris: Secondary | ICD-10-CM | POA: Diagnosis not present

## 2021-11-08 DIAGNOSIS — I5022 Chronic systolic (congestive) heart failure: Secondary | ICD-10-CM

## 2021-11-08 DIAGNOSIS — I484 Atypical atrial flutter: Secondary | ICD-10-CM

## 2021-11-08 DIAGNOSIS — E785 Hyperlipidemia, unspecified: Secondary | ICD-10-CM | POA: Diagnosis not present

## 2021-11-08 NOTE — Patient Instructions (Signed)
Medication Instructions:  The current medical regimen is effective;  continue present plan and medications as directed. Please refer to the Current Medication list given to you today.   *If you need a refill on your cardiac medications before your next appointment, please call your pharmacy*  Lab Work:    BMET IN 1 WEEK.      If you have labs (blood work) drawn today and your tests are completely normal, you will receive your results only by: [place] (if you have MyChart) OR  A paper copy in the mail If you have any lab test that is abnormal or we need to change your treatment, we will call you to review the results.  Special Instructions PLEASE READ AND FOLLOW SALTY 6-ATTACHED-1,'800mg'$  daily  PLEASE INCREASE PHYSICAL ACTIVITY AS TOLERATED   Please try to avoid these triggers: Do not use any products that have nicotine or tobacco in them. These include cigarettes, e-cigarettes, and chewing tobacco. If you need help quitting, ask your doctor. Eat heart-healthy foods. Talk with your doctor about the right eating plan for you. Exercise regularly as told by your doctor. Stay hydrated Do not drink alcohol, Caffeine or chocolate. Lose weight if you are overweight. Do not use drugs, including cannabis   Follow-Up: Your next appointment:  KEEP SCHEDULED  APPOINTMENT  In Person with Peter Martinique, MD    At [facility], you and your health needs are our priority.  As part of our continuing mission to provide you with exceptional heart care, we have created designated Provider Care Teams.  These Care Teams include your primary Cardiologist (physician) and Advanced Practice Providers (APPs -  Physician Assistants and Nurse Practitioners) who all work together to provide you with the care you need, when you need it.  Important Information About Sugar               6 SALTY THINGS TO AVOID     1,'800MG'$  DAILY

## 2021-11-17 ENCOUNTER — Encounter: Payer: Self-pay | Admitting: Cardiology

## 2021-11-17 NOTE — Telephone Encounter (Signed)
Spoke to patient appointment scheduled with Coletta Memos NP 11/23/21 at 1:55 pm.

## 2021-11-22 NOTE — Progress Notes (Unsigned)
Cardiology Clinic Note   Patient Name: Clark Clark Date of Encounter: 11/23/2021  Primary Care Provider:  Lavone Orn, MD Primary Cardiologist:  Hector Martinique, MD  Patient Profile    Clark Clark 76 year old male presents to the clinic today for follow-up evaluation of his persistent atrial fibrillation and coronary artery disease.  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   LIMA to LAD and SVG to OM with [facility] via right thigh   S/P Maze operation for atrial fibrillation 06/24/2017   Left atrial lesion set using bipolar radiofrequency and cryothermy ablation via conventional median sternotomy with clipping of LA appendage   Visit for monitoring Tikosyn therapy 06/18/2017   Past Surgical History:  Procedure Laterality Date   bone graph     CARDIOVERSION N/A 03/25/2017   Procedure: CARDIOVERSION;  Surgeon: Clark Margarita, MD;  Location: [place];  Service: Cardiovascular;  Laterality: N/A;   CARDIOVERSION N/A 11/25/2020   Procedure: CARDIOVERSION;  Surgeon: Clark Dresser, MD;  Location: [place];  Service: Cardiovascular;  Laterality: N/A;   CARDIOVERSION N/A 11/01/2021   Procedure: CARDIOVERSION;  Surgeon: Hector Hector, MD;  Location: [place];  Service: Cardiovascular;  Laterality: N/A;   CLIPPING OF ATRIAL APPENDAGE N/A 06/24/2017   Procedure: CLIPPING OF ATRIAL APPENDAGE;  Surgeon: Hector Alberts, MD;  Location: [place];  Service: Open Heart Surgery;  Laterality: N/A;   CORONARY ARTERY BYPASS GRAFT N/A 06/24/2017   Procedure: CORONARY ARTERY BYPASS GRAFTING (CABG) times two utilizing left anterior mammary artery and right greater saphenous vein harvested endoscopically;  Surgeon: Hector Alberts, MD;  Location: [place];  Service: Open Heart Surgery;   Laterality: N/A;   IABP INSERTION N/A 06/24/2017   Procedure: IABP Insertion;  Surgeon: Clark, Hector M, MD;  Location: [place] [facility];  Service: Cardiovascular;  Laterality: N/A;   LEFT HEART CATH AND CORONARY ANGIOGRAPHY N/A 06/24/2017   Procedure: LEFT HEART CATH AND CORONARY ANGIOGRAPHY;  Surgeon: Clark, Hector M, MD;  Location: [place] [facility];  Service: Cardiovascular;  Laterality: N/A;   MAZE N/A 06/24/2017   Procedure: MAZE;  Surgeon: Hector Alberts, MD;  Location: [place];  Service: Open Heart Surgery;  Laterality: N/A;   TEE WITHOUT CARDIOVERSION N/A 06/24/2017   Procedure: TRANSESOPHAGEAL ECHOCARDIOGRAM (TEE);  Surgeon: Hector Alberts, MD;  Location: [place];  Service: Open Heart Surgery;  Laterality: N/A;   TEE WITHOUT CARDIOVERSION N/A 11/25/2020   Procedure: TRANSESOPHAGEAL ECHOCARDIOGRAM (TEE);  Surgeon: Clark Dresser, MD;  Location: [place] [facility];  Service: Cardiovascular;  Laterality: N/A;   TEE WITHOUT CARDIOVERSION N/A 11/01/2021   Procedure: TRANSESOPHAGEAL ECHOCARDIOGRAM (TEE);  Surgeon: Hector Hector, MD;  Location: [facility];  Service: Cardiovascular;  Laterality: N/A;   TOE SURGERY Left    4th toe   tophus      Allergies  Allergies  Allergen Reactions   Atorvastatin Other (See Comments)    Other reaction(s): Muscle pain    History of Present Illness    Clark Clark has a PMH of atrial fibrillation, coronary artery disease status post CABG x2 LIMA-LAD and SVG-OM with maze and LAA clipping 2/19, NSTEMI, HTN, hyperlipidemia, and leg cramps.  His Tikosyn was discontinued 6/19.  He had  not had a recurrence of atrial fibrillation since his maze procedure until an event was noted via Fitbit 6/22.  An EKG confirmed atrial flutter with RVR.  He underwent TEE and DCCV on 7/22.  His LVEF was noted to be 35-40%.  His echocardiogram 10/22 showed an LVEF of 55-60%, mild MR, mild AS.  He presents to the emergency department with complaints of tachycardia.  He  again noted his heart rate was elevated on his Fitbit.  He was mildly short of breath with elevated rates.  He did not take his morning medications prior to presenting to the emergency department.  His EKG showed narrow complex tachycardia to the 1 atrial flutter with a rate of 134 bpm.  He was started on IV diltiazem and did not have significant improvement.  He underwent TEE cardioversion on 11/01/2020.  He received 1 shock with 150 J.  He tolerated procedure well.  He presented to the clinic 11/08/21 for follow-up evaluation and stated he felt well.  We reviewed his cardioversion.  He was cardiac unaware.  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  He regularly monitored his Fitbit.  He did note that when he had atrial fibrillation his blood pressure also decreased and his fatigue increased.  He also noted that he had missed a few doses of his medication.  We reviewed the importance of avoiding triggers.  I will asked him to increase his physical activity, gave the salty 6 diet sheet, continued his current medication regimen, planned repeat BMP in 2 weeks (will resume Entresto at that time based on K) and plan follow-up as scheduled.  We contacted the nurse triage line on 11/17/2021 and reported episodes of irregular heart rates.  He presents to the clinic today for follow-up evaluation states last week he noted elevated heart rates intermittently.  It woke him from sleep at night on Thursday.  He noted heart rates up to the 130s.  An hour later his heart rate went back down.  He reported drinking 1 cup of coffee, sweet tea, and some wine that day.  He also reports that he continues to have elevated stress related to his work.  He did take 25 mg of metoprolol tartrate twice daily for 3 days and his heart rate came back down.  He asks about antianxiety medication.  We reviewed the importance of maintaining hydration and avoiding triggers.  He expressed understanding.  I asked him  to follow-up with his PCP related to antianxiety medication.  He did ask about CBD medication as well which I advised against due to his apixaban.  His EKG today shows sinus rhythm with questionable AV block left axis deviation left lower branch block 64 bpm.  We will plan to have him follow-up as scheduled with [name].  Today he denies chest pain, shortness of breath, lower extremity edema, fatigue, palpitations, melena, hematuria, hemoptysis, diaphoresis, weakness, presyncope, syncope, orthopnea, and PND.    Disposition: Follow-up with [name] or me as scheduled.   Home Medications    Prior to Admission medications   Medication Sig Start Date End Date Taking? Authorizing Provider  apixaban (ELIQUIS) 5 MG TABS tablet Take 1 tablet (5 mg total) by mouth 2 (two) times daily. 01/24/21   Deboraha Sprang, MD  cholecalciferol (VITAMIN D) 25 MCG (1000 UNIT) tablet Take 1,000 Units by mouth daily.    [provider]  empagliflozin (JARDIANCE) 10 MG TABS tablet Take 1 tablet (10  mg total) by mouth daily before breakfast. 02/27/21   Clark, Hector M, MD  metoprolol tartrate (LOPRESSOR) 25 MG tablet Take 0.5 tablets (12.5 mg total) by mouth 2 (two) times daily. 11/01/21   Bhagat, Crista Luria, PA  Multiple Vitamins-Minerals (ZINC PO) Take 250 mg by mouth daily.    [provider]  rosuvastatin (CRESTOR) 10 MG tablet Take 1 tablet (10 mg total) by mouth daily. 03/20/21   Clark, Hector M, MD  sildenafil (VIAGRA) 25 MG tablet Take 25 mg by mouth daily as needed for erectile dysfunction.    [provider]  vitamin C (ASCORBIC ACID) 500 MG tablet Take 500 mg by mouth daily.    [provider]    Family History    Family History  Problem Relation Age of Onset   Liver cancer Mother 56   Esophageal cancer Father 71   Healthy Daughter    Heart disease Neg Hx    He indicated that his mother is deceased. He indicated that his father is deceased. He indicated that his  daughter is alive. He indicated that the status of his neg hx is unknown.  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  Cardiovascular:  No chest pain, dyspnea on exertion, edema, orthopnea, palpitations, paroxysmal nocturnal dyspnea. Dermatological: No rash, lesions/masses Respiratory: No cough, dyspnea Urologic: No hematuria, dysuria Abdominal:   No nausea, vomiting, diarrhea, bright red blood per rectum, melena, or hematemesis Neurologic:  No visual changes, wkns, changes in mental status. All other systems reviewed and are otherwise negative except as noted above.  Physical Exam    VS:  BP 120/72   Pulse 64   Ht 6' 1.5" (1.867 Clark)   Wt 222 lb 3.2 oz (100.8 kg)   SpO2 98%   BMI 28.92 kg/Clark  , BMI Body mass index is 28.92 kg/Clark. GEN: Well nourished, well developed, in no acute distress. HEENT: normal. Neck: Supple, no JVD, carotid bruits, or masses. Cardiac: RRR, no murmurs, rubs, or gallops. No clubbing, cyanosis, euvolemic.  Radials/DP/PT 2+ and equal  bilaterally.  Respiratory:  Respirations regular and unlabored, clear to auscultation bilaterally. GI: Soft, nontender, nondistended, BS + x 4. MS: no deformity or atrophy. Skin: warm and dry, no rash. Neuro:  Strength and sensation are intact. Psych: Normal affect.  Accessory Clinical Findings    Recent  Labs: 12/13/2020: ALT 17 10/30/2021: Hemoglobin 17.1; Magnesium 2.2; Platelets 292 10/31/2021: BUN 15; Creatinine, Ser 1.04; Potassium 4.3; Sodium 137   Recent Lipid Panel    Component Value Date/Time   CHOL 115 12/13/2020 0827   TRIG 125 12/13/2020 0827   HDL 41 12/13/2020 0827   CHOLHDL 2.8 12/13/2020 0827   LDLCALC 52 12/13/2020 0827    ECG personally reviewed by me today-EKG today normal sinus rhythm with first-degree AV block left axis deviation left bundle branch block 64 bpm  EKG 11/08/2021 sinus bradycardia first-degree AV block left bundle branch block 57 bpm.    TEE 11/01/2021  IMPRESSIONS     1. No LAA clot. [place] x 1 with 150 J converted to NSR No immediate  neurologic sequelae On Rx anticoagulation with no missed doses.   2. Left ventricular ejection fraction, by estimation, is 55 to 60%. The  left ventricle has normal function.   3. Right ventricular systolic function is normal. The right ventricular  size is normal.   4. Post MAZE with LA clipping no residual lumen to appendage noted . Left  atrial size was moderately dilated. No left atrial/left atrial appendage  thrombus was detected.   5. Right atrial size was moderately dilated.   6. The mitral valve is normal in structure. Mild mitral valve  regurgitation.   7. The aortic valve is tricuspid. Aortic valve regurgitation is not  visualized.   Assessment & Plan   1. Atrial flutter/atrial fibrillation-EKG today shows sinus rhythm with first-degree AV block 64 bpm . Status post successful TEE and cardioversion 11/01/2021.  He contacted the nurse triage line on 11/17/2021 and noted that his heart rate had  been varying between 70 and 125.  His blood pressure was well controlled.  Continues to report compliance with apixaban and denies bleeding issues. Continue apixaban, metoprolol May take an extra 6.25 mg of metoprolol as needed for extended periods of irregular/accelerated heart rate. Heart healthy low-sodium diet Increase physical activity as tolerated Avoid triggers caffeine, chocolate, EtOH, dehydration etc.  Coronary artery disease-denies chest pain.  Underwent CABG 2019 with two-vessel and maze, and LAA clipping. Tikosyn was discontinued 6/19.  Echocardiogram 10/22 showed an increase to LVEF to 55-60%, mild MR and mild AS. Continue metoprolol, rosuvastatin Heart healthy low-sodium diet-salty 6 given Increase physical activity as tolerated  Chronic systolic CHF-continues to be able to perform baseline activities.  TEE showed LVEF of 55-60%.  Details above.  Delene Loll was held during hospitalization due to hyperkalemia.  Plan to restart Entresto if potassium allows. Continue metoprolol Plan to restart Entresto 24/26 after BMP. Heart healthy low-sodium diet-salty 6 given Increase physical activity as tolerated Order BMP, repeat BMP in 2 weeks    Disposition: Follow-up with [name] or me as scheduled.  Jossie Ng. Zayra Devito NP-C    11/23/2021, 2:56 PM [place] [address] [address] Office [phone] Fax [phone]  Notice: This dictation was prepared with Dragon dictation along with smaller phrase technology. Any transcriptional errors that result from this process are unintentional and may not be corrected upon review.  I spent 14 minutes examining this patient, reviewing medications, and using patient centered shared decision making involving her cardiac care.  Prior to her visit I spent greater than 20 minutes reviewing her past medical history,  medications, and prior cardiac tests.

## 2021-11-23 ENCOUNTER — Ambulatory Visit (INDEPENDENT_AMBULATORY_CARE_PROVIDER_SITE_OTHER): Payer: Medicare Other | Admitting: General Practice

## 2021-11-23 ENCOUNTER — Encounter: Payer: Self-pay | Admitting: General Practice

## 2021-11-23 VITALS — BP 120/72 | HR 64 | Ht 73.5 in | Wt 222.2 lb

## 2021-11-23 DIAGNOSIS — I484 Atypical atrial flutter: Secondary | ICD-10-CM

## 2021-11-23 DIAGNOSIS — I25810 Atherosclerosis of coronary artery bypass graft(s) without angina pectoris: Secondary | ICD-10-CM | POA: Diagnosis not present

## 2021-11-23 DIAGNOSIS — I5022 Chronic systolic (congestive) heart failure: Secondary | ICD-10-CM

## 2021-11-23 NOTE — Patient Instructions (Signed)
Medication Instructions:  The current medical regimen is effective;  continue present plan and medications as directed. Please refer to the Current Medication list given to you today.   *If you need a refill on your cardiac medications before your next appointment, please call your pharmacy*  Lab Work:    BMET-AS SOON AS YOU CAN.     If you have labs (blood work) drawn today and your tests are completely normal, you will receive your results only by: [place] (if you have MyChart) OR  A paper copy in the mail If you have any lab test that is abnormal or we need to change your treatment, we will call you to review the results.  Testing/Procedures:  NONE   Special Instructions YOU MAY TAKE AN EXTRA METOPROLOL 6.'25MG'$  FOR INCREASED HEART RATE, CHECK BLOOD PRESSURE MUST BE 120-130. CALL us IF NEEDED  CONTACT PRIMARY MD TO DISCUSS ATARAX PRESCRIPTION.  Please try to avoid these triggers: Do not use any products that have nicotine or tobacco in them. These include cigarettes, e-cigarettes, and chewing tobacco. If you need help quitting, ask your doctor. Eat heart-healthy foods. Talk with your doctor about the right eating plan for you. Exercise regularly as told by your doctor. Stay hydrated Do not drink alcohol, Caffeine or chocolate. Lose weight if you are overweight. Do not use drugs, including cannabis   Follow-Up: Your next appointment:  KEEP SCHEDULED  APPOINTMENT  In Person with Hector Martinique, MD    At [facility], you and your health needs are our priority.  As part of our continuing mission to provide you with exceptional heart care, we have created designated Provider Care Teams.  These Care Teams include your primary Cardiologist (physician) and Advanced Practice Providers (APPs -  Physician Assistants and Nurse Practitioners) who all work together to provide you with the care you need, when you need it.  Important Information About Sugar

## 2021-12-01 DIAGNOSIS — I484 Atypical atrial flutter: Secondary | ICD-10-CM | POA: Diagnosis not present

## 2021-12-01 DIAGNOSIS — I5022 Chronic systolic (congestive) heart failure: Secondary | ICD-10-CM | POA: Diagnosis not present

## 2021-12-01 DIAGNOSIS — I25810 Atherosclerosis of coronary artery bypass graft(s) without angina pectoris: Secondary | ICD-10-CM | POA: Diagnosis not present

## 2021-12-01 LAB — BASIC METABOLIC PANEL
BUN/Creatinine Ratio: 16 (ref 10–24)
BUN: 14 mg/dL (ref 8–27)
CO2: 25 mmol/L (ref 20–29)
Calcium: 9.4 mg/dL (ref 8.6–10.2)
Chloride: 104 mmol/L (ref 96–106)
Creatinine, Ser: 0.9 mg/dL (ref 0.76–1.27)
Glucose: 124 mg/dL — ABNORMAL HIGH (ref 70–99)
Potassium: 4.9 mmol/L (ref 3.5–5.2)
Sodium: 137 mmol/L (ref 134–144)
eGFR: 90 mL/min/{1.73_m2} (ref >59)

## 2021-12-12 NOTE — Progress Notes (Unsigned)
Cardiology Office Note   Date:  12/18/2021   ID:  Damin Salido, DOB [birth year], MRN [id number]  PCP:  Lavone Orn, MD  Cardiologist:   Levern Pitter Martinique, MD   Chief Complaint  Patient presents with   Atrial Fibrillation   Atrial Flutter       History of Present Illness: Krrish Freund is a 76 y.o. male with a past medical history of CAD s/p CABG in 06/2017, persistent atrial fibrillation s/p MAZE procedure and LAA clip, prediabetes, and hyperlipidemia.  Holter monitor obtained in September 2018 showed atrial fibrillation with episodes of PVCs and nonsustained ventricular tachycardia versus aberrancy.  He had previous DCCV in November 2018 and underwent successful cardioversion.  TEE obtained in February 2019 showed EF 35 to 40%, trace MR and TR. In February 2019 he presented with STEMI with ST elevation in the inferior leads. He was taken to the cath lab emergently and had critical left main stenosis and occluded LCx. He then underwent emergent CABG with Dr Roxy Manns. This included a LIMA to the LAD and SVG to OM. He had MAZE and LA clipping.  His Tikosyn was discontinued in June 2019.     He presented to the ED on 09/22/2018 with back pain.  CT was negative for acute dissection or large PE.  Troponin was negative.    He had done well post MAZE with no known episodes of afib until he noted an elevated heart rate on his FitBit on 11/04/20. ECG confirmed atrial flutter with rapid rates. He underwent TEE guided DCCV on 11/25/20. Echo showed reduced EF 35-40%.  He was seen in the ED in June with complaints of tachycardia.  He noted his heart rate was elevated on his Fitbit.  He was mildly short of breath with elevated rates.  He did not take his morning medications prior to presenting to the emergency department.  His EKG showed narrow complex tachycardia to the 1 atrial flutter with a rate of 134 bpm.  He was started on IV diltiazem and did not have significant improvement.  He underwent TEE  cardioversion on 11/01/2021.  He received 1 shock with 150 J.  He tolerated procedure well.   He presented to the clinic 11/08/21 for follow-up evaluation and stated he felt well.   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  He regularly monitored his Fitbit.  He did note that when he had atrial fibrillation his blood pressure also decreased and his fatigue increased.  He also noted that he had missed a few doses of his medication.    He contacted the nurse triage line on 11/17/2021 and reported episodes of irregular heart rates. Seen again on July 6 and noted some intermittent tachycardia with rate up to 130s. Took metoprolol with subsequent improvement. Was in NSR on follow up on July 6.  Over the past week he has noted heart rate increase with typical symptoms of flutter with fatigue. HR on Fit bit will be low at night but during the day will go up to 135 just working on computer. Has been avoiding wine and caffeine.       Past Medical History:  Diagnosis Date   Chest pain    with nagative cardiolite   Gout    occational flare   Hyperlipemia    Persistent atrial fibrillation (HCC)    Peyronie disease    S/P CABG x 2 06/24/2017   LIMA to LAD and SVG to  OM with EVH via right thigh   S/P Maze operation for atrial fibrillation 06/24/2017   Left atrial lesion set using bipolar radiofrequency and cryothermy ablation via conventional median sternotomy with clipping of LA appendage   Visit for monitoring Tikosyn therapy 06/18/2017    Past Surgical History:  Procedure Laterality Date   bone graph     CARDIOVERSION N/A 03/25/2017   Procedure: CARDIOVERSION;  Surgeon: Sueanne Margarita, MD;  Location: [facility];  Service: Cardiovascular;  Laterality: N/A;   CARDIOVERSION N/A 11/25/2020   Procedure: CARDIOVERSION;  Surgeon: Buford Dresser, MD;  Location: [facility] ENDOSCOPY;  Service: Cardiovascular;  Laterality: N/A;   CARDIOVERSION N/A 11/01/2021   Procedure:  CARDIOVERSION;  Surgeon: Josue Hector, MD;  Location: [facility] ENDOSCOPY;  Service: Cardiovascular;  Laterality: N/A;   CLIPPING OF ATRIAL APPENDAGE N/A 06/24/2017   Procedure: CLIPPING OF ATRIAL APPENDAGE;  Surgeon: Rexene Alberts, MD;  Location: [place];  Service: Open Heart Surgery;  Laterality: N/A;   CORONARY ARTERY BYPASS GRAFT N/A 06/24/2017   Procedure: CORONARY ARTERY BYPASS GRAFTING (CABG) times two utilizing left anterior mammary artery and right greater saphenous vein harvested endoscopically;  Surgeon: Rexene Alberts, MD;  Location: [place];  Service: Open Heart Surgery;  Laterality: N/A;   IABP INSERTION N/A 06/24/2017   Procedure: IABP Insertion;  Surgeon: Martinique, Cecylia Brazill M, MD;  Location: [place] CV [facility];  Service: Cardiovascular;  Laterality: N/A;   LEFT HEART CATH AND CORONARY ANGIOGRAPHY N/A 06/24/2017   Procedure: LEFT HEART CATH AND CORONARY ANGIOGRAPHY;  Surgeon: Martinique, Shalamar Crays M, MD;  Location: [place] CV LAB;  Service: Cardiovascular;  Laterality: N/A;   MAZE N/A 06/24/2017   Procedure: MAZE;  Surgeon: Rexene Alberts, MD;  Location: [place];  Service: Open Heart Surgery;  Laterality: N/A;   TEE WITHOUT CARDIOVERSION N/A 06/24/2017   Procedure: TRANSESOPHAGEAL ECHOCARDIOGRAM (TEE);  Surgeon: Rexene Alberts, MD;  Location: [place];  Service: Open Heart Surgery;  Laterality: N/A;   TEE WITHOUT CARDIOVERSION N/A 11/25/2020   Procedure: TRANSESOPHAGEAL ECHOCARDIOGRAM (TEE);  Surgeon: Buford Dresser, MD;  Location: [facility] ENDOSCOPY;  Service: Cardiovascular;  Laterality: N/A;   TEE WITHOUT CARDIOVERSION N/A 11/01/2021   Procedure: TRANSESOPHAGEAL ECHOCARDIOGRAM (TEE);  Surgeon: Josue Hector, MD;  Location: [facility] ENDOSCOPY;  Service: Cardiovascular;  Laterality: N/A;   TOE SURGERY Left    4th toe   tophus       Current Outpatient Medications  Medication Sig Dispense Refill   apixaban (ELIQUIS) 5 MG TABS tablet Take 1 tablet (5 mg total) by mouth 2 (two) times daily. 60 tablet 3    cholecalciferol (VITAMIN D) 25 MCG (1000 UNIT) tablet Take 1,000 Units by mouth daily.     empagliflozin (JARDIANCE) 10 MG TABS tablet Take 1 tablet (10 mg total) by mouth daily before breakfast. 30 tablet 6   metoprolol tartrate (LOPRESSOR) 25 MG tablet Take 0.5 tablets (12.5 mg total) by mouth 2 (two) times daily. 90 tablet 3   Multiple Vitamins-Minerals (ZINC PO) Take 250 mg by mouth daily.     rosuvastatin (CRESTOR) 10 MG tablet Take 1 tablet (10 mg total) by mouth daily. 90 tablet 3   sildenafil (VIAGRA) 25 MG tablet Take 25 mg by mouth daily as needed for erectile dysfunction.     vitamin C (ASCORBIC ACID) 500 MG tablet Take 500 mg by mouth daily.     Vitamin E 400 units TABS 1 tablet     No current facility-administered medications for this  visit.    Allergies:   Atorvastatin    Social History:  The patient  reports that he has never smoked. He has never used smokeless tobacco. He reports current alcohol use. He reports that he does not use drugs.   Family History:  The patient's family history includes Esophageal cancer (age of onset: 94) in his father; Healthy in his daughter; Liver cancer (age of onset: 27) in his mother.    ROS:  Please see the history of present illness.   Otherwise, review of systems are positive for none.   All other systems are reviewed and negative.    PHYSICAL EXAM: VS:  BP 110/61   Pulse 72   Ht 6' 1.5" (1.867 m)   Wt 222 lb 9.6 oz (101 kg)   SpO2 97%   BMI 28.97 kg/m  , BMI Body mass index is 28.97 kg/m. GEN: Well nourished, well developed, in no acute distress  HEENT: normal  Neck: no JVD, carotid bruits, or masses Cardiac: IRRR; no murmurs, rubs, or gallops,no edema  Respiratory:  clear to auscultation bilaterally, normal work of breathing GI: soft, nontender, nondistended, + BS MS: no deformity or atrophy  Skin: warm and dry, no rash Neuro:  Strength and sensation are intact Psych: euthymic mood, full affect   EKG:  EKG is  ordered  today. Atypical atrial flutter with rate 93. LBBB. QTc 509 msec. I have personally reviewed and interpreted this study.     Recent Labs: 10/30/2021: Hemoglobin 17.1; Magnesium 2.2; Platelets 292 12/01/2021: BUN 14; Creatinine, Ser 0.90; Potassium 4.9; Sodium 137   Dated 08/03/21: A1c 6.7%. cholesterol 125, triglycerides 98, HDL 45, LDL 61. CMET normal.   Lipid Panel    Component Value Date/Time   CHOL 115 12/13/2020 0827   TRIG 125 12/13/2020 0827   HDL 41 12/13/2020 0827   CHOLHDL 2.8 12/13/2020 0827   LDLCALC 52 12/13/2020 0827      Wt Readings from Last 3 Encounters:  12/18/21 222 lb 9.6 oz (101 kg)  11/23/21 222 lb 3.2 oz (100.8 kg)  11/08/21 226 lb (102.5 kg)      Other studies Reviewed: Additional studies/ records that were reviewed today include:   CABG x 2 on 06/24/2017   Procedure:        Coronary Artery Bypass Grafting x 2              Left Internal Mammary Artery to Distal Left Anterior Descending Coronary Artery             Saphenous Vein Graft to Obtuse Marginal Branch of Left Circumflex Coronary Artery             Endoscopic Vein Harvest from Right Thigh    Maze Procedure              Left atrial lesion set using bipolar radiofrequency and cryothermy ablation             Clipping of left atrial appendage (Atriclip size 17m)   Echo 11/06/17: Study Conclusions   - Left ventricle: The cavity size was normal. Wall thickness was   increased in a pattern of mild LVH. Systolic function was normal.   The estimated ejection fraction was in the range of 50% to 55%.   Diffuse hypokinesis. There was no evidence of elevated   ventricular filling pressure by Doppler parameters. - Aortic valve: Trileaflet; mildly thickened, mildly calcified   leaflets.  Echo 12/15/20: IMPRESSIONS     1. Left ventricular  ejection fraction, by estimation, is 35 to 40%. The  left ventricle has moderately decreased function. The left ventricle  demonstrates global hypokinesis.   2.  Right ventricular systolic function is normal. The right ventricular  size is normal.   3. S/P prior LAA clip, with very small residual ostium of LAA. No  thrombus noted.. No left atrial/left atrial appendage thrombus was  detected.   4. Bowing/trivial prolapse of the posterior mitral valve leaflet at P2.  The mitral valve is abnormal. Trivial mitral valve regurgitation.   5. The aortic valve is tricuspid. Aortic valve regurgitation is not  visualized. No aortic stenosis is present.   6. There is Moderate (Grade III) plaque involving the descending aorta.   Conclusion(s)/Recommendation(s): Normal biventricular function without  evidence of hemodynamically significant valvular heart disease. No LA/LAA  thrombus identified. Successful cardioversion performed with restoration  of normal sinus rhythm  Myoview 12/06/20: Study Highlights    The left ventricular ejection fraction is moderately decreased (30-44%). Nuclear stress EF: 41%. This is an intermediate risk study.   Abnormal, intermediate risk stress nuclear study with no ischemia or infarction.  Gated ejection fraction 41% with global hypokinesis and mild left ventricular enlargement.  Study interpreted as intermediate risk due to reduced LV function.   Echo 03/13/21: IMPRESSIONS     1. Left ventricular ejection fraction, by estimation, is 55 to 60%. The  left ventricle has normal function. The left ventricle has no regional  wall motion abnormalities. Left ventricular diastolic parameters are  indeterminate.   2. Right ventricular systolic function is normal. The right ventricular  size is normal. There is normal pulmonary artery systolic pressure. The  estimated right ventricular systolic pressure is 83.4 mmHg.   3. The mitral valve is grossly normal. Mild mitral valve regurgitation.  No evidence of mitral stenosis.   4. The aortic valve is tricuspid. There is mild calcification of the  aortic valve. Aortic valve  regurgitation is not visualized. Mild aortic  valve sclerosis is present, with no evidence of aortic valve stenosis.   5. The inferior vena cava is dilated in size with >50% respiratory  variability, suggesting right atrial pressure of 8 mmHg.   Comparison(s): No significant change from prior study.   ASSESSMENT AND PLAN:  1.  CAD s/p CABG in Feb 2019 for left main disease: Continue aspirin.  He is asymptomatic.   Hyperlipidemia: LDL 61. On Crestor 10 mg daily. Tolerating well. History of intolerance to lipitor.    Prediabetes: Last hemoglobin A1c was increased to 6.7%.  discussed lifestyle modification with weight loss and carbohydrate restricted diet. now on  Jardiance 10 mg daily.     4. atrial fibrillation s/p Maze procedure and LAA clip: Recurrent atrial flutter in June 2022. S/p TEE guided DCCV. Repeat DCCV June of 2023 for atypical flutter with ERAD. He is symptomatic. Rate control is OK today on low dose metoprolol but has periods of higher HR. Discussed options including AAD therapy versus ablation. Given atypical flutter and prior MAZE procedure I think ablation will be more difficult. We discussed AAD therapy. Not a candidate for IC agents due to CAD. I don't think Sotalol is a good option since he has significant bradycardia in NSR. I don't think Multaq will be very effective in this setting. That leaves Tikosyn versus amiodarone. He has taken Tikosyn in the past and tolerated well. I would favor this over amiodarone. Will arrange visit with Afib clinic to arrange admission. QTc is prolonged but in the  setting of LBBB I don't know if this is an exclusion. Will discuss with Afib clinic. If fails AAD therapy will need to see EP.     5. Chronic systolic CHF. EF down to 40%. No ischemia on Myoview. Suspect rate related. On Entresto and beta blocker. Will add Jardiance. Reduce lasix to 20 mg daily. Follow up Echo in October 2022 showed recovery of EF to normal. Recent TEE in June 2023 also  showed normal EF.    Current medicines are reviewed at length with the patient today.  The patient does not have concerns regarding medicines.  The following changes have been made:  no change  Labs/ tests ordered today include:  No orders of the defined types were placed in this encounter.     Disposition:   FU with me after Afib evaluation   Signed, Michel Hendon Martinique, MD  12/18/2021 10:08 AM    [facility] [address] [place], [place], [address] Phone [phone], Fax [phone]

## 2021-12-18 ENCOUNTER — Ambulatory Visit (INDEPENDENT_AMBULATORY_CARE_PROVIDER_SITE_OTHER): Payer: Medicare Other | Admitting: Cardiology

## 2021-12-18 ENCOUNTER — Encounter: Payer: Self-pay | Admitting: Cardiology

## 2021-12-18 VITALS — BP 110/61 | HR 72 | Ht 73.5 in | Wt 222.6 lb

## 2021-12-18 DIAGNOSIS — I10 Essential (primary) hypertension: Secondary | ICD-10-CM

## 2021-12-18 DIAGNOSIS — I25810 Atherosclerosis of coronary artery bypass graft(s) without angina pectoris: Secondary | ICD-10-CM

## 2021-12-18 DIAGNOSIS — I5022 Chronic systolic (congestive) heart failure: Secondary | ICD-10-CM

## 2021-12-18 DIAGNOSIS — I484 Atypical atrial flutter: Secondary | ICD-10-CM | POA: Diagnosis not present

## 2021-12-18 NOTE — Patient Instructions (Signed)
Medication Instructions:  Continue same medications *If you need a refill on your cardiac medications before your next appointment, please call your pharmacy*   Lab Work: None ordered   Testing/Procedures: None ordered   Follow-Up: At [facility], you and your health needs are our priority.  As part of our continuing mission to provide you with exceptional heart care, we have created designated Provider Care Teams.  These Care Teams include your primary Cardiologist (physician) and Advanced Practice Providers (APPs -  Physician Assistants and Nurse Practitioners) who all work together to provide you with the care you need, when you need it.  We recommend signing up for the patient portal called "MyChart".  Sign up information is provided on this After Visit Summary.  MyChart is used to connect with patients for Virtual Visits (Telemedicine).  Patients are able to view lab/test results, encounter notes, upcoming appointments, etc.  Non-urgent messages can be sent to your provider as well.   To learn more about what you can do with MyChart, go to [email or web address].    Your next appointment:  To Be Determined    The format for your next appointment:  Office   Provider:  Dr.Jordan     [facility]   Wed 8/2 at 9:00 am  Code # [id number]     Important Information About Sugar

## 2021-12-19 ENCOUNTER — Encounter: Payer: Self-pay | Admitting: Cardiology

## 2021-12-20 ENCOUNTER — Ambulatory Visit (HOSPITAL_COMMUNITY)
Admission: RE | Admit: 2021-12-20 | Discharge: 2021-12-20 | Disposition: A | Discharge status: Home / Self Care | Payer: Medicare Other | Source: Ambulatory Visit | Attending: Physician Assistant | Admitting: Physician Assistant

## 2021-12-20 ENCOUNTER — Encounter (HOSPITAL_COMMUNITY): Payer: Self-pay | Admitting: Physician Assistant

## 2021-12-20 VITALS — BP 114/70 | HR 94 | Ht 73.5 in | Wt 221.6 lb

## 2021-12-20 DIAGNOSIS — I2510 Atherosclerotic heart disease of native coronary artery without angina pectoris: Secondary | ICD-10-CM | POA: Insufficient documentation

## 2021-12-20 DIAGNOSIS — I4819 Other persistent atrial fibrillation: Secondary | ICD-10-CM | POA: Diagnosis not present

## 2021-12-20 DIAGNOSIS — Z7901 Long term (current) use of anticoagulants: Secondary | ICD-10-CM | POA: Diagnosis not present

## 2021-12-20 DIAGNOSIS — Z79899 Other long term (current) drug therapy: Secondary | ICD-10-CM | POA: Diagnosis not present

## 2021-12-20 DIAGNOSIS — D6869 Other thrombophilia: Secondary | ICD-10-CM | POA: Insufficient documentation

## 2021-12-20 DIAGNOSIS — I252 Old myocardial infarction: Secondary | ICD-10-CM | POA: Insufficient documentation

## 2021-12-20 DIAGNOSIS — I484 Atypical atrial flutter: Secondary | ICD-10-CM | POA: Diagnosis not present

## 2021-12-20 DIAGNOSIS — I5022 Chronic systolic (congestive) heart failure: Secondary | ICD-10-CM | POA: Insufficient documentation

## 2021-12-20 DIAGNOSIS — Z951 Presence of aortocoronary bypass graft: Secondary | ICD-10-CM | POA: Insufficient documentation

## 2021-12-20 DIAGNOSIS — E785 Hyperlipidemia, unspecified: Secondary | ICD-10-CM | POA: Insufficient documentation

## 2021-12-20 NOTE — Progress Notes (Signed)
Primary Care Physician: Lavone Orn, MD Primary Cardiologist: Dr Martinique Primary Electrophysiologist: Dr [name] Referring Physician: Dr Martinique   Hector Clark is a 76 y.o. male with a history of CAD s/p CABG 2019 with MAZE and LAA clip, HLD, HFrEF, atrial fibrillation who presents for follow up in the [facility]. Patient is on Eliquis for a CHADS2VASC score of 3. He had previous DCCV in November 2018 and underwent successful cardioversion.  TEE obtained in February 2019 showed EF 35 to 40%, trace MR and TR. In February 2019 he presented with STEMI with ST elevation in the inferior leads. He was taken to the cath lab emergently and had critical left main stenosis and occluded LCx. He then underwent emergent CABG with Dr Roxy Manns. This included a LIMA to the LAD and SVG to OM. He had MAZE and LA clipping.  His Tikosyn was discontinued in June 2019.    He had done well post MAZE with no known episodes of afib until he noted an elevated heart rate on his FitBit on 11/04/20. ECG confirmed atrial flutter with rapid rates. He underwent TEE guided DCCV on 11/25/20.    He was seen in the ED in June 2023 with complaints of tachycardia.  He noted his heart rate was elevated on his Fitbit.  He was mildly short of breath with elevated rates. His EKG showed narrow complex tachycardia to the 1 atrial flutter with a rate of 134 bpm.  He was started on IV diltiazem and did not have significant improvement.  He underwent TEE cardioversion on 11/01/2021. On follow up with Dr Martinique 12/18/21 patient was back in atrial flutter. Referred back to afib clinic to discuss options.  On follow up today, patient states he keeps track of his heart rates and he appears to be going in and out of rhythm. His heart rate is mostly 60s-80s but will have hours of rapid rates up to 150 bpm. He is in atrial flutter today. His primary symptom while out of rhythm is fatigue.   Today, he denies symptoms of  palpitations, chest pain, shortness of breath, orthopnea, PND, lower extremity edema, dizziness, presyncope, syncope, snoring, daytime somnolence, bleeding, or neurologic sequela. The patient is tolerating medications without difficulties and is otherwise without complaint today.    Atrial Fibrillation Risk Factors:  he does not have symptoms or diagnosis of sleep apnea. he does not have a history of rheumatic fever. he does have a history of alcohol use.   he has a BMI of Body mass index is 28.84 kg/m.Marland Kitchen Filed Weights   12/20/21 0857  Weight: 100.5 kg    Family History  Problem Relation Age of Onset   Liver cancer Mother 42   Esophageal cancer Father 38   Healthy Daughter    Heart disease Neg Hx      Atrial Fibrillation Management history:  Previous antiarrhythmic drugs: dofetilide  Previous cardioversions: 2018, 2022, 11/01/21 Previous ablations: MAZE 2019 CHADS2VASC score: 3 Anticoagulation history: Eliquis   Past Medical History:  Diagnosis Date   Chest pain    with nagative cardiolite   Gout    occational flare   Hyperlipemia    Persistent atrial fibrillation (HCC)    Peyronie disease    S/P CABG x 2 06/24/2017   LIMA to LAD and SVG to OM with [facility] via right thigh   S/P Maze operation for atrial fibrillation 06/24/2017   Left atrial lesion set using bipolar radiofrequency and cryothermy ablation via  conventional median sternotomy with clipping of LA appendage   Visit for monitoring Tikosyn therapy 06/18/2017   Past Surgical History:  Procedure Laterality Date   bone graph     CARDIOVERSION N/A 03/25/2017   Procedure: CARDIOVERSION;  Surgeon: Sueanne Margarita, MD;  Location: [facility] ENDOSCOPY;  Service: Cardiovascular;  Laterality: N/A;   CARDIOVERSION N/A 11/25/2020   Procedure: CARDIOVERSION;  Surgeon: Buford Dresser, MD;  Location: [facility] ENDOSCOPY;  Service: Cardiovascular;  Laterality: N/A;   CARDIOVERSION N/A 11/01/2021   Procedure: CARDIOVERSION;  Surgeon:  Josue Hector, MD;  Location: [place];  Service: Cardiovascular;  Laterality: N/A;   CLIPPING OF ATRIAL APPENDAGE N/A 06/24/2017   Procedure: CLIPPING OF ATRIAL APPENDAGE;  Surgeon: Rexene Alberts, MD;  Location: [place];  Service: Open Heart Surgery;  Laterality: N/A;   CORONARY ARTERY BYPASS GRAFT N/A 06/24/2017   Procedure: CORONARY ARTERY BYPASS GRAFTING (CABG) times two utilizing left anterior mammary artery and right greater saphenous vein harvested endoscopically;  Surgeon: Rexene Alberts, MD;  Location: [place];  Service: Open Heart Surgery;  Laterality: N/A;   IABP INSERTION N/A 06/24/2017   Procedure: IABP Insertion;  Surgeon: Martinique, Peter M, MD;  Location: [place] [facility];  Service: Cardiovascular;  Laterality: N/A;   LEFT HEART CATH AND CORONARY ANGIOGRAPHY N/A 06/24/2017   Procedure: LEFT HEART CATH AND CORONARY ANGIOGRAPHY;  Surgeon: Martinique, Peter M, MD;  Location: [place] [facility];  Service: Cardiovascular;  Laterality: N/A;   MAZE N/A 06/24/2017   Procedure: MAZE;  Surgeon: Rexene Alberts, MD;  Location: [place];  Service: Open Heart Surgery;  Laterality: N/A;   TEE WITHOUT CARDIOVERSION N/A 06/24/2017   Procedure: TRANSESOPHAGEAL ECHOCARDIOGRAM (TEE);  Surgeon: Rexene Alberts, MD;  Location: [place];  Service: Open Heart Surgery;  Laterality: N/A;   TEE WITHOUT CARDIOVERSION N/A 11/25/2020   Procedure: TRANSESOPHAGEAL ECHOCARDIOGRAM (TEE);  Surgeon: Buford Dresser, MD;  Location: [facility] ENDOSCOPY;  Service: Cardiovascular;  Laterality: N/A;   TEE WITHOUT CARDIOVERSION N/A 11/01/2021   Procedure: TRANSESOPHAGEAL ECHOCARDIOGRAM (TEE);  Surgeon: Josue Hector, MD;  Location: [facility] ENDOSCOPY;  Service: Cardiovascular;  Laterality: N/A;   TOE SURGERY Left    4th toe   tophus      Current Outpatient Medications  Medication Sig Dispense Refill   apixaban (ELIQUIS) 5 MG TABS tablet Take 1 tablet (5 mg total) by mouth 2 (two) times daily. 60 tablet 3   cholecalciferol (VITAMIN D) 25  MCG (1000 UNIT) tablet Take 1,000 Units by mouth daily.     empagliflozin (JARDIANCE) 10 MG TABS tablet Take 1 tablet (10 mg total) by mouth daily before breakfast. 30 tablet 6   metoprolol tartrate (LOPRESSOR) 25 MG tablet Take 0.5 tablets (12.5 mg total) by mouth 2 (two) times daily. 90 tablet 3   Multiple Vitamins-Minerals (ZINC PO) Take 250 mg by mouth daily.     rosuvastatin (CRESTOR) 10 MG tablet Take 1 tablet (10 mg total) by mouth daily. 90 tablet 3   sildenafil (VIAGRA) 25 MG tablet Take 25 mg by mouth daily as needed for erectile dysfunction.     vitamin C (ASCORBIC ACID) 500 MG tablet Take 500 mg by mouth daily.     Vitamin E 400 units TABS 1 tablet     No current facility-administered medications for this encounter.    Allergies  Allergen Reactions   Atorvastatin Other (See Comments)    Other reaction(s): Muscle pain    Social History   Socioeconomic History  Marital status: Married    Spouse name: Not on file   Number of children: Not on file   Years of education: Not on file   Highest education level: Not on file  Occupational History   Not on file  Tobacco Use   Smoking status: Never   Smokeless tobacco: Never   Tobacco comments:    Never smoke 12/20/21  Vaping Use   Vaping Use: Never used  Substance and Sexual Activity   Alcohol use: Yes    Comment: 1 beer per month   Drug use: No   Sexual activity: Not on file  Other Topics Concern   Not on file  Social History Narrative   Lives in [place]   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  Physical Exam: Vitals:   12/20/21 0857  BP: 114/70  Pulse: 94   Weight: 100.5 kg  Height: 6' 1.5" (1.867 m)    GEN- The patient is a well appearing male, alert and oriented x 3 today.   Head- normocephalic, atraumatic Eyes-  Sclera clear, conjunctiva pink Ears- hearing intact Oropharynx- clear Neck- supple  Lungs- Clear to ausculation bilaterally, normal work of breathing Heart- irregular rate and rhythm, no murmurs, rubs or gallops  GI- soft, NT, ND, + BS Extremities- no clubbing, cyanosis, or edema MS- no significant deformity or atrophy Skin- no rash or lesion Psych- euthymic mood, full affect Neuro- strength and sensation are intact  Wt Readings from Last 3 Encounters:  12/20/21 100.5 kg  12/18/21 101 kg  11/23/21 100.8 kg    EKG today demonstrates  Atypical atrial flutter, LBBB Vent. rate 94 BPM PR interval * ms QRS duration 150 ms QT/QTcB 402/502 ms  Echo 03/13/21 demonstrated   1. Left ventricular ejection fraction, by estimation, is 55 to 60%. The  left ventricle has normal function. The left ventricle has no regional  wall motion abnormalities. Left ventricular diastolic parameters are  indeterminate.   2. Right ventricular systolic function is normal. The right ventricular  size is normal. There is normal pulmonary artery systolic pressure. The  estimated right ventricular systolic pressure is 48.1 mmHg.   3. The mitral valve is grossly normal. Mild mitral valve regurgitation.  No evidence of mitral stenosis.   4. The aortic valve is tricuspid. There is mild calcification of the  aortic valve. Aortic valve regurgitation is not visualized. Mild aortic  valve sclerosis is present, with no evidence of aortic valve stenosis.   5. The inferior vena cava is dilated in size with >50% respiratory  variability, suggesting right atrial pressure of 8 mmHg.   Comparison(s): No significant change from prior study.   Epic records are reviewed at length today  CHA2DS2-VASc Score = 3  The patient's score is based upon: CHF  History: 1 HTN History: 0 Diabetes History: 0 Stroke History: 0 Vascular Disease History: 1 Age Score: 1 Gender Score: 0       ASSESSMENT AND PLAN: 1. Persistent Atrial Fibrillation/atrial flutter The patient's CHA2DS2-VASc score is 3, indicating a 3.2% annual risk of stroke.   S/p MAZE 2019 We discussed  rhythm control options today. Specifically discussed dofetilide and amiodarone. Patient wants to pursue dofetilide. He has been on 500 mcg BID dose previously.  Continue Eliquis 5 mg BID PharmD to screen medications QTc in SR 482 ms in setting of LBBB, manually corrects to ~440-450 ms. Recent labs reviewed.  Continue Lopressor 12.5 mg BID  2. Secondary Hypercoagulable State (ICD10:  D68.69) The patient is at significant risk for stroke/thromboembolism based upon his CHA2DS2-VASc Score of 3.  Continue Apixaban (Eliquis).   3. Chronic HFrEF EF previously 40%, now recovered  Fluid status appears stable today.  4. CAD S/p CABG 2019 No anginal symptoms.   Patient to call clinic to schedule dofetilide admission, did not have his calender with him today.    [facility] [address] [place], [place] [address] [phone] 12/20/2021 9:29 AM

## 2021-12-21 ENCOUNTER — Telehealth: Payer: Self-pay

## 2021-12-21 NOTE — Telephone Encounter (Signed)
Medication list reviewed in anticipation of upcoming Tikosyn initiation. Patient is not taking any contraindicated or QTc prolonging medications.   Patient is anticoagulated on Eliquis on the appropriate dose. Please ensure that patient has not missed any anticoagulation doses in the 3 weeks prior to Tikosyn initiation.   Patient will need to be counseled to avoid use of Benadryl while on Tikosyn and in the 2-3 days prior to Tikosyn initiation.  

## 2021-12-27 NOTE — Telephone Encounter (Signed)
Pt will call when he is ready to proceed with tikosyn loading. At this time pt not ready to commit.

## 2021-12-29 NOTE — Telephone Encounter (Signed)
Patient has decided he would prefer to hold off on pursing Tikosyn unless his burden significantly increases.  He purchased a kardia to monitor his burden. Pt will call if increase in burden to proceed with Tikosyn at that point.

## 2022-01-15 ENCOUNTER — Other Ambulatory Visit: Payer: Self-pay | Admitting: Internal Medicine

## 2022-02-08 DIAGNOSIS — I4892 Unspecified atrial flutter: Secondary | ICD-10-CM | POA: Diagnosis not present

## 2022-02-08 DIAGNOSIS — I480 Paroxysmal atrial fibrillation: Secondary | ICD-10-CM | POA: Diagnosis not present

## 2022-02-08 DIAGNOSIS — I255 Ischemic cardiomyopathy: Secondary | ICD-10-CM | POA: Diagnosis not present

## 2022-02-08 DIAGNOSIS — I2510 Atherosclerotic heart disease of native coronary artery without angina pectoris: Secondary | ICD-10-CM | POA: Diagnosis not present

## 2022-02-08 DIAGNOSIS — E1169 Type 2 diabetes mellitus with other specified complication: Secondary | ICD-10-CM | POA: Diagnosis not present

## 2022-03-01 ENCOUNTER — Telehealth: Payer: Self-pay

## 2022-03-01 NOTE — Telephone Encounter (Signed)
Spoke to patient Hector Clark received a old after visit summary when you saw Hector Clark 11/23/21 in mail last week.Patient stated he did not mail summary.Stated he has been doing good.He has changed his habits and heart has been in rhythm.He never had the ablation and does not want to go back to the afib clinic.Follow up appointment scheduled with Hector Clark 11/10 at 8:40 am.

## 2022-03-16 NOTE — Progress Notes (Signed)
Cardiology Office Note   Date:  03/30/2022   ID:  Hector Clark, DOB 03/18/1946, MRN [id number]  PCP:  Lavone Orn, MD  Cardiologist:   Delorise Hunkele Martinique, MD   Chief Complaint  Patient presents with   Coronary Artery Disease   Atrial Fibrillation       History of Present Illness: Hector Clark is a 76 y.o. male with a past medical history of CAD s/p CABG in 06/2017, persistent atrial fibrillation s/p MAZE procedure and LAA clip, prediabetes, and hyperlipidemia.  Holter monitor obtained in September 2018 showed atrial fibrillation with episodes of PVCs and nonsustained ventricular tachycardia versus aberrancy.  He had previous DCCV in November 2018 and underwent successful cardioversion.  TEE obtained in February 2019 showed EF 35 to 40%, trace MR and TR. In February 2019 he presented with STEMI with ST elevation in the inferior leads. He was taken to the cath lab emergently and had critical left main stenosis and occluded LCx. He then underwent emergent CABG with Dr Roxy Manns. This included a LIMA to the LAD and SVG to OM. He had MAZE and LA clipping.  His Tikosyn was discontinued in June 2019.     He presented to the ED on 09/22/2018 with back pain.  CT was negative for acute dissection or large PE.  Troponin was negative.    He had done well post MAZE with no known episodes of afib until he noted an elevated heart rate on his FitBit on 11/04/20. ECG confirmed atrial flutter with rapid rates. He underwent TEE guided DCCV on 11/25/20. Echo showed reduced EF 35-40%.  He was seen in the ED in June with complaints of tachycardia.  He noted his heart rate was elevated on his Fitbit.  He was mildly short of breath with elevated rates.  He did not take his morning medications prior to presenting to the emergency department.  His EKG showed narrow complex tachycardia to the 1 atrial flutter with a rate of 134 bpm.  He was started on IV diltiazem and did not have significant improvement.  He underwent  TEE cardioversion on 11/01/2021.  He received 1 shock with 150 J.  He tolerated procedure well.   He presented to the clinic 11/08/21 for follow-up evaluation and stated he felt well.   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  He regularly monitored his Fitbit.  He did note that when he had atrial fibrillation his blood pressure also decreased and his fatigue increased.  He also noted that he had missed a few doses of his medication.    He contacted the nurse triage line on 11/17/2021 and reported episodes of irregular heart rates. Seen again on July 6 and noted some intermittent tachycardia with rate up to 130s. Took metoprolol with subsequent improvement. Was in NSR on follow up on July 6.  Over the past week he has noted heart rate increase with typical symptoms of flutter with fatigue. HR on Fit bit will be low at night but during the day will go up to 135 just working on computer. Has been avoiding wine and caffeine.   We referred him to the Afib clinic. There was consideration of Tikosyn load but patient states he cut out caffeine and since then rhythm has done well. Has Cardiomobile device and has not been out of rhythm. He denies any palpitations, dizziness, chest pain or SOB. Is sleeping better.       Past Medical History:  Diagnosis Date   Chest pain    with nagative cardiolite   Gout    occational flare   Hyperlipemia    Persistent atrial fibrillation (HCC)    Peyronie disease    S/P CABG x 2 06/24/2017   LIMA to LAD and SVG to OM with [facility] via right thigh   S/P Maze operation for atrial fibrillation 06/24/2017   Left atrial lesion set using bipolar radiofrequency and cryothermy ablation via conventional median sternotomy with clipping of LA appendage   Visit for monitoring Tikosyn therapy 06/18/2017    Past Surgical History:  Procedure Laterality Date   bone graph     CARDIOVERSION N/A 03/25/2017   Procedure: CARDIOVERSION;  Surgeon: Sueanne Margarita, MD;  Location: [place];  Service: Cardiovascular;  Laterality: N/A;   CARDIOVERSION N/A 11/25/2020   Procedure: CARDIOVERSION;  Surgeon: Buford Dresser, MD;  Location: [place];  Service: Cardiovascular;  Laterality: N/A;   CARDIOVERSION N/A 11/01/2021   Procedure: CARDIOVERSION;  Surgeon: Josue Hector, MD;  Location: [facility] ENDOSCOPY;  Service: Cardiovascular;  Laterality: N/A;   CLIPPING OF ATRIAL APPENDAGE N/A 06/24/2017   Procedure: CLIPPING OF ATRIAL APPENDAGE;  Surgeon: Rexene Alberts, MD;  Location: [place];  Service: Open Heart Surgery;  Laterality: N/A;   CORONARY ARTERY BYPASS GRAFT N/A 06/24/2017   Procedure: CORONARY ARTERY BYPASS GRAFTING (CABG) times two utilizing left anterior mammary artery and right greater saphenous vein harvested endoscopically;  Surgeon: Rexene Alberts, MD;  Location: [place];  Service: Open Heart Surgery;  Laterality: N/A;   IABP INSERTION N/A 06/24/2017   Procedure: IABP Insertion;  Surgeon: Martinique, Sahaana Weitman M, MD;  Location: [place] CV LAB;  Service: Cardiovascular;  Laterality: N/A;   LEFT HEART CATH AND CORONARY ANGIOGRAPHY N/A 06/24/2017   Procedure: LEFT HEART CATH AND CORONARY ANGIOGRAPHY;  Surgeon: Martinique, Millie Forde M, MD;  Location: [place] CV LAB;  Service: Cardiovascular;  Laterality: N/A;   MAZE N/A 06/24/2017   Procedure: MAZE;  Surgeon: Rexene Alberts, MD;  Location: [place];  Service: Open Heart Surgery;  Laterality: N/A;   TEE WITHOUT CARDIOVERSION N/A 06/24/2017   Procedure: TRANSESOPHAGEAL ECHOCARDIOGRAM (TEE);  Surgeon: Rexene Alberts, MD;  Location: [place];  Service: Open Heart Surgery;  Laterality: N/A;   TEE WITHOUT CARDIOVERSION N/A 11/25/2020   Procedure: TRANSESOPHAGEAL ECHOCARDIOGRAM (TEE);  Surgeon: Buford Dresser, MD;  Location: [facility] ENDOSCOPY;  Service: Cardiovascular;  Laterality: N/A;   TEE WITHOUT CARDIOVERSION N/A 11/01/2021   Procedure: TRANSESOPHAGEAL ECHOCARDIOGRAM (TEE);  Surgeon: Josue Hector, MD;   Location: [facility] ENDOSCOPY;  Service: Cardiovascular;  Laterality: N/A;   TOE SURGERY Left    4th toe   tophus       Current Outpatient Medications  Medication Sig Dispense Refill   apixaban (ELIQUIS) 5 MG TABS tablet Take 1 tablet (5 mg total) by mouth 2 (two) times daily. 60 tablet 3   empagliflozin (JARDIANCE) 10 MG TABS tablet Take 1 tablet (10 mg total) by mouth daily before breakfast. 30 tablet 6   metoprolol tartrate (LOPRESSOR) 25 MG tablet Take 0.5 tablets (12.5 mg total) by mouth 2 (two) times daily. 90 tablet 3   rosuvastatin (CRESTOR) 10 MG tablet Take 1 tablet (10 mg total) by mouth daily. 90 tablet 3   No current facility-administered medications for this visit.    Allergies:   Atorvastatin    Social History:  The patient  reports that he has never smoked. He has never used smokeless tobacco.  He reports current alcohol use. He reports that he does not use drugs.   Family History:  The patient's family history includes Esophageal cancer (age of onset: 53) in his father; Healthy in his daughter; Liver cancer (age of onset: 21) in his mother.    ROS:  Please see the history of present illness.   Otherwise, review of systems are positive for none.   All other systems are reviewed and negative.    PHYSICAL EXAM: VS:  BP 106/62   Pulse 65   Ht '6\' 2"'$  (1.88 m)   Wt 227 lb 12.8 oz (103.3 kg)   SpO2 96%   BMI 29.25 kg/m  , BMI Body mass index is 29.25 kg/m. GEN: Well nourished, well developed, in no acute distress  HEENT: normal  Neck: no JVD, carotid bruits, or masses Cardiac: RRR; no murmurs, rubs, or gallops,no edema  Respiratory:  clear to auscultation bilaterally, normal work of breathing GI: soft, nontender, nondistended, + BS MS: no deformity or atrophy  Skin: warm and dry, no rash Neuro:  Strength and sensation are intact Psych: euthymic mood, full affect   EKG:  EKG is not  ordered today.      Recent Labs: 10/30/2021: Hemoglobin 17.1; Magnesium 2.2;  Platelets 292 12/01/2021: BUN 14; Creatinine, Ser 0.90; Potassium 4.9; Sodium 137   Dated 08/03/21: A1c 6.7%. cholesterol 125, triglycerides 98, HDL 45, LDL 61. CMET normal.  Dated 02/08/22: A1c 6.5%  Lipid Panel    Component Value Date/Time   CHOL 115 12/13/2020 0827   TRIG 125 12/13/2020 0827   HDL 41 12/13/2020 0827   CHOLHDL 2.8 12/13/2020 0827   LDLCALC 52 12/13/2020 0827      Wt Readings from Last 3 Encounters:  03/30/22 227 lb 12.8 oz (103.3 kg)  12/20/21 221 lb 9.6 oz (100.5 kg)  12/18/21 222 lb 9.6 oz (101 kg)      Other studies Reviewed: Additional studies/ records that were reviewed today include:   CABG x 2 on 06/24/2017   Procedure:        Coronary Artery Bypass Grafting x 2              Left Internal Mammary Artery to Distal Left Anterior Descending Coronary Artery             Saphenous Vein Graft to Obtuse Marginal Branch of Left Circumflex Coronary Artery             Endoscopic Vein Harvest from Right Thigh    Maze Procedure              Left atrial lesion set using bipolar radiofrequency and cryothermy ablation             Clipping of left atrial appendage (Atriclip size 32m)   Echo 11/06/17: Study Conclusions   - Left ventricle: The cavity size was normal. Wall thickness was   increased in a pattern of mild LVH. Systolic function was normal.   The estimated ejection fraction was in the range of 50% to 55%.   Diffuse hypokinesis. There was no evidence of elevated   ventricular filling pressure by Doppler parameters. - Aortic valve: Trileaflet; mildly thickened, mildly calcified   leaflets.  Echo 12/15/20: IMPRESSIONS     1. Left ventricular ejection fraction, by estimation, is 35 to 40%. The  left ventricle has moderately decreased function. The left ventricle  demonstrates global hypokinesis.   2. Right ventricular systolic function is normal. The right ventricular  size is normal.  3. S/P prior LAA clip, with very small residual ostium of LAA.  No  thrombus noted.. No left atrial/left atrial appendage thrombus was  detected.   4. Bowing/trivial prolapse of the posterior mitral valve leaflet at P2.  The mitral valve is abnormal. Trivial mitral valve regurgitation.   5. The aortic valve is tricuspid. Aortic valve regurgitation is not  visualized. No aortic stenosis is present.   6. There is Moderate (Grade III) plaque involving the descending aorta.   Conclusion(s)/Recommendation(s): Normal biventricular function without  evidence of hemodynamically significant valvular heart disease. No LA/LAA  thrombus identified. Successful cardioversion performed with restoration  of normal sinus rhythm  Myoview 12/06/20: Study Highlights    The left ventricular ejection fraction is moderately decreased (30-44%). Nuclear stress EF: 41%. This is an intermediate risk study.   Abnormal, intermediate risk stress nuclear study with no ischemia or infarction.  Gated ejection fraction 41% with global hypokinesis and mild left ventricular enlargement.  Study interpreted as intermediate risk due to reduced LV function.   Echo 03/13/21: IMPRESSIONS     1. Left ventricular ejection fraction, by estimation, is 55 to 60%. The  left ventricle has normal function. The left ventricle has no regional  wall motion abnormalities. Left ventricular diastolic parameters are  indeterminate.   2. Right ventricular systolic function is normal. The right ventricular  size is normal. There is normal pulmonary artery systolic pressure. The  estimated right ventricular systolic pressure is 89.3 mmHg.   3. The mitral valve is grossly normal. Mild mitral valve regurgitation.  No evidence of mitral stenosis.   4. The aortic valve is tricuspid. There is mild calcification of the  aortic valve. Aortic valve regurgitation is not visualized. Mild aortic  valve sclerosis is present, with no evidence of aortic valve stenosis.   5. The inferior vena cava is dilated in  size with >50% respiratory  variability, suggesting right atrial pressure of 8 mmHg.   Comparison(s): No significant change from prior study.   ASSESSMENT AND PLAN:  1.  CAD s/p CABG in Feb 2019 for left main disease: Continue aspirin.  He is asymptomatic.   Hyperlipidemia: LDL 61. On Crestor 10 mg daily. Tolerating well. History of intolerance to lipitor.    Prediabetes: Last hemoglobin A1c was increased to 6.7%.  discussed lifestyle modification with weight loss and carbohydrate restricted diet. now on  Jardiance 10 mg daily. A1c down to 6.5%.      4. Atrial fibrillation s/p Maze procedure and LAA clip: Recurrent atrial flutter in June 2022. S/p TEE guided DCCV. Repeat DCCV June of 2023 for atypical flutter with ERAD. Has not had recurrence since eliminating caffeine. Continue to monitor with Kardiomobile device. On Eliquis    5. Chronic systolic CHF. EF down to 40%. No ischemia on Myoview. Suspect rate related. Now on low dose beta blocker and Jardiance. Off lasix and Entresto.  Follow up Echo in October 2022 showed recovery of EF to normal. Recent TEE in June 2023 also showed normal EF.    Current medicines are reviewed at length with the patient today.  The patient does not have concerns regarding medicines.  The following changes have been made:  no change  Labs/ tests ordered today include:  No orders of the defined types were placed in this encounter.     Disposition:   FU 6 months  Signed, Rockland Kotarski Martinique, MD  03/30/2022 8:39 AM    [facility] [address] [place], [place], [address]  Phone [phone], Fax [phone]

## 2022-03-26 ENCOUNTER — Other Ambulatory Visit: Payer: Self-pay | Admitting: Cardiology

## 2022-03-30 ENCOUNTER — Ambulatory Visit: Payer: Medicare Other | Attending: Cardiology | Admitting: Cardiology

## 2022-03-30 ENCOUNTER — Encounter: Payer: Self-pay | Admitting: Cardiology

## 2022-03-30 VITALS — BP 106/62 | HR 65 | Ht 74.0 in | Wt 227.8 lb

## 2022-03-30 DIAGNOSIS — I484 Atypical atrial flutter: Secondary | ICD-10-CM | POA: Diagnosis not present

## 2022-03-30 DIAGNOSIS — I25810 Atherosclerosis of coronary artery bypass graft(s) without angina pectoris: Secondary | ICD-10-CM

## 2022-03-30 DIAGNOSIS — I10 Essential (primary) hypertension: Secondary | ICD-10-CM | POA: Insufficient documentation

## 2022-03-30 DIAGNOSIS — I5022 Chronic systolic (congestive) heart failure: Secondary | ICD-10-CM | POA: Diagnosis not present

## 2022-03-30 DIAGNOSIS — E785 Hyperlipidemia, unspecified: Secondary | ICD-10-CM | POA: Diagnosis not present

## 2022-03-30 MED ORDER — ROSUVASTATIN CALCIUM 10 MG PO TABS: 10.0000 mg | ORAL_TABLET | Freq: Every day | ORAL | 3 refills | Status: DC

## 2022-07-03 DIAGNOSIS — E119 Type 2 diabetes mellitus without complications: Secondary | ICD-10-CM | POA: Diagnosis not present

## 2022-07-03 DIAGNOSIS — H524 Presbyopia: Secondary | ICD-10-CM | POA: Diagnosis not present

## 2022-07-03 DIAGNOSIS — H52223 Regular astigmatism, bilateral: Secondary | ICD-10-CM | POA: Diagnosis not present

## 2022-07-03 DIAGNOSIS — H2513 Age-related nuclear cataract, bilateral: Secondary | ICD-10-CM | POA: Diagnosis not present

## 2022-07-03 DIAGNOSIS — H18821 Corneal disorder due to contact lens, right eye: Secondary | ICD-10-CM | POA: Diagnosis not present

## 2022-07-03 DIAGNOSIS — H1789 Other corneal scars and opacities: Secondary | ICD-10-CM | POA: Diagnosis not present

## 2022-07-03 DIAGNOSIS — H5213 Myopia, bilateral: Secondary | ICD-10-CM | POA: Diagnosis not present

## 2022-08-24 DIAGNOSIS — E785 Hyperlipidemia, unspecified: Secondary | ICD-10-CM

## 2022-08-24 DIAGNOSIS — I25810 Atherosclerosis of coronary artery bypass graft(s) without angina pectoris: Secondary | ICD-10-CM

## 2022-08-24 DIAGNOSIS — I5022 Chronic systolic (congestive) heart failure: Secondary | ICD-10-CM

## 2022-08-24 DIAGNOSIS — R7303 Prediabetes: Secondary | ICD-10-CM

## 2022-08-24 DIAGNOSIS — I10 Essential (primary) hypertension: Secondary | ICD-10-CM

## 2022-08-24 DIAGNOSIS — I484 Atypical atrial flutter: Secondary | ICD-10-CM

## 2022-08-29 DIAGNOSIS — E785 Hyperlipidemia, unspecified: Secondary | ICD-10-CM | POA: Diagnosis not present

## 2022-08-29 DIAGNOSIS — E119 Type 2 diabetes mellitus without complications: Secondary | ICD-10-CM | POA: Diagnosis not present

## 2022-08-29 DIAGNOSIS — D3132 Benign neoplasm of left choroid: Secondary | ICD-10-CM | POA: Diagnosis not present

## 2022-08-29 DIAGNOSIS — I484 Atypical atrial flutter: Secondary | ICD-10-CM | POA: Diagnosis not present

## 2022-08-29 DIAGNOSIS — I5022 Chronic systolic (congestive) heart failure: Secondary | ICD-10-CM | POA: Diagnosis not present

## 2022-08-29 DIAGNOSIS — R7303 Prediabetes: Secondary | ICD-10-CM | POA: Diagnosis not present

## 2022-08-29 DIAGNOSIS — H1789 Other corneal scars and opacities: Secondary | ICD-10-CM | POA: Diagnosis not present

## 2022-08-29 DIAGNOSIS — H2513 Age-related nuclear cataract, bilateral: Secondary | ICD-10-CM | POA: Diagnosis not present

## 2022-08-29 DIAGNOSIS — I25810 Atherosclerosis of coronary artery bypass graft(s) without angina pectoris: Secondary | ICD-10-CM | POA: Diagnosis not present

## 2022-08-29 DIAGNOSIS — I10 Essential (primary) hypertension: Secondary | ICD-10-CM | POA: Diagnosis not present

## 2022-08-29 LAB — LIPID PANEL

## 2022-08-30 LAB — COMPREHENSIVE METABOLIC PANEL
ALT: 20 IU/L (ref 0–44)
AST: 18 IU/L (ref 0–40)
Albumin/Globulin Ratio: 1.5 (ref 1.2–2.2)
Albumin: 4.4 g/dL (ref 3.8–4.8)
Alkaline Phosphatase: 137 IU/L — ABNORMAL HIGH (ref 44–121)
BUN/Creatinine Ratio: 21 (ref 10–24)
BUN: 17 mg/dL (ref 8–27)
Bilirubin Total: 0.8 mg/dL (ref 0.0–1.2)
CO2: 22 mmol/L (ref 20–29)
Calcium: 9.5 mg/dL (ref 8.6–10.2)
Chloride: 102 mmol/L (ref 96–106)
Creatinine, Ser: 0.81 mg/dL (ref 0.76–1.27)
Globulin, Total: 3 g/dL (ref 1.5–4.5)
Glucose: 98 mg/dL (ref 70–99)
Potassium: 5.2 mmol/L (ref 3.5–5.2)
Sodium: 141 mmol/L (ref 134–144)
Total Protein: 7.4 g/dL (ref 6.0–8.5)
eGFR: 92 mL/min/{1.73_m2} (ref >59)

## 2022-08-30 LAB — HEMOGLOBIN A1C
Est. average glucose Bld gHb Est-mCnc: 154 mg/dL
Hgb A1c MFr Bld: 7 % — ABNORMAL HIGH (ref 4.8–5.6)

## 2022-08-30 LAB — LIPID PANEL
Chol/HDL Ratio: 2.6 ratio (ref 0.0–5.0)
Cholesterol, Total: 135 mg/dL (ref 100–199)
HDL: 51 mg/dL (ref >39)
LDL Chol Calc (NIH): 62 mg/dL (ref 0–99)
Triglycerides: 122 mg/dL (ref 0–149)
VLDL Cholesterol Cal: 22 mg/dL (ref 5–40)

## 2022-08-30 LAB — CBC
Hematocrit: 51.1 % — ABNORMAL HIGH (ref 37.5–51.0)
Hemoglobin: 17.1 g/dL (ref 13.0–17.7)
MCH: 29.8 pg (ref 26.6–33.0)
MCHC: 33.5 g/dL (ref 31.5–35.7)
MCV: 89 fL (ref 79–97)
Platelets: 188 10*3/uL (ref 150–450)
RBC: 5.73 x10E6/uL (ref 4.14–5.80)
RDW: 12.3 % (ref 11.6–15.4)
WBC: 8 10*3/uL (ref 3.4–10.8)

## 2022-08-30 LAB — MAGNESIUM: Magnesium: 2.3 mg/dL (ref 1.6–2.3)

## 2022-08-31 NOTE — Progress Notes (Unsigned)
Cardiology Clinic Note   Patient Name: Hector Clark Date of Encounter: 09/04/2022  Primary Care Provider:  Kirby Funk, MD Primary Cardiologist:  Peter Swaziland, MD  Patient Profile    Hector Clark 77 year old male presents to the clinic today for follow-up evaluation of his persistent atrial fibrillation and coronary artery disease.  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   LIMA to LAD and SVG to OM with [facility] via right thigh   S/P Maze operation for atrial fibrillation 06/24/2017   Left atrial lesion set using bipolar radiofrequency and cryothermy ablation via conventional median sternotomy with clipping of LA appendage   Visit for monitoring Tikosyn therapy 06/18/2017   Past Surgical History:  Procedure Laterality Date   bone graph     CARDIOVERSION N/A 03/25/2017   Procedure: CARDIOVERSION;  Surgeon: Quintella Reichert, MD;  Location: [facility] ENDOSCOPY;  Service: Cardiovascular;  Laterality: N/A;   CARDIOVERSION N/A 11/25/2020   Procedure: CARDIOVERSION;  Surgeon: Jodelle Red, MD;  Location: [facility] ENDOSCOPY;  Service: Cardiovascular;  Laterality: N/A;   CARDIOVERSION N/A 11/01/2021   Procedure: CARDIOVERSION;  Surgeon: Wendall Stade, MD;  Location: [facility] ENDOSCOPY;  Service: Cardiovascular;  Laterality: N/A;   CLIPPING OF ATRIAL APPENDAGE N/A 06/24/2017   Procedure: CLIPPING OF ATRIAL APPENDAGE;  Surgeon: Purcell Nails, MD;  Location: [facility] OR;  Service: Open Heart Surgery;  Laterality: N/A;   CORONARY ARTERY BYPASS GRAFT N/A 06/24/2017   Procedure: CORONARY ARTERY BYPASS GRAFTING (CABG) times two utilizing left anterior mammary artery and right greater saphenous vein harvested endoscopically;  Surgeon: Purcell Nails, MD;  Location: [facility] OR;  Service: Open Heart Surgery;  Laterality:  N/A;   IABP INSERTION N/A 06/24/2017   Procedure: IABP Insertion;  Surgeon: Swaziland, Peter M, MD;  Location: [facility] INVASIVE CV LAB;  Service: Cardiovascular;  Laterality: N/A;   LEFT HEART CATH AND CORONARY ANGIOGRAPHY N/A 06/24/2017   Procedure: LEFT HEART CATH AND CORONARY ANGIOGRAPHY;  Surgeon: Swaziland, Peter M, MD;  Location: [facility] INVASIVE CV LAB;  Service: Cardiovascular;  Laterality: N/A;   MAZE N/A 06/24/2017   Procedure: MAZE;  Surgeon: Purcell Nails, MD;  Location: [facility] OR;  Service: Open Heart Surgery;  Laterality: N/A;   TEE WITHOUT CARDIOVERSION N/A 06/24/2017   Procedure: TRANSESOPHAGEAL ECHOCARDIOGRAM (TEE);  Surgeon: Purcell Nails, MD;  Location: [facility] OR;  Service: Open Heart Surgery;  Laterality: N/A;   TEE WITHOUT CARDIOVERSION N/A 11/25/2020   Procedure: TRANSESOPHAGEAL ECHOCARDIOGRAM (TEE);  Surgeon: Jodelle Red, MD;  Location: [facility] ENDOSCOPY;  Service: Cardiovascular;  Laterality: N/A;   TEE WITHOUT CARDIOVERSION N/A 11/01/2021   Procedure: TRANSESOPHAGEAL ECHOCARDIOGRAM (TEE);  Surgeon: Wendall Stade, MD;  Location: [facility] ENDOSCOPY;  Service: Cardiovascular;  Laterality: N/A;   TOE SURGERY Left    4th toe   tophus      Allergies  Allergies  Allergen Reactions   Atorvastatin Other (See Comments)    Other reaction(s): Muscle pain    History of Present Illness    Hector Clark has a PMH of atrial fibrillation, coronary artery disease status post CABG x2 LIMA-LAD and SVG-OM with maze and LAA clipping 2/19, NSTEMI, HTN, hyperlipidemia, and leg cramps.  His Tikosyn was discontinued 6/19.  He had not  had a recurrence of atrial fibrillation since his maze procedure until an event was noted via Fitbit 6/22.  An EKG confirmed atrial flutter with RVR.  He underwent TEE and DCCV on 7/22.  His LVEF was noted to be 35-40%.  His echocardiogram 10/22 showed an LVEF of 55-60%, mild MR, mild AS.  He presents to the emergency department with complaints of tachycardia.  He again noted his  heart rate was elevated on his Fitbit.  He was mildly short of breath with elevated rates.  He did not take his morning medications prior to presenting to the emergency department.  His EKG showed narrow complex tachycardia to the 1 atrial flutter with a rate of 134 bpm.  He was started on IV diltiazem and did not have significant improvement.  He underwent TEE cardioversion on 11/01/2020.  He received 1 shock with 150 J.  He tolerated procedure well.  He presented to the clinic 11/08/21 for follow-up evaluation and stated he felt well.  We reviewed his cardioversion.  He was cardiac unaware.  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  He regularly monitored his Fitbit.  He did note that when he had atrial fibrillation his blood pressure also decreased and his fatigue increased.  He also noted that he had missed a few doses of his medication.  We reviewed the importance of avoiding triggers.  I will asked him to increase his physical activity, gave the salty 6 diet sheet, continued his current medication regimen, planned repeat BMP in 2 weeks (will resume Entresto at that time based on K) and plan follow-up as scheduled.  We contacted the nurse triage line on 11/17/2021 and reported episodes of irregular heart rates.  He presented to the clinic 11/23/21 for follow-up evaluation stated last week he noted elevated heart rates intermittently.  It woke him from sleep at night on Thursday.  He noted heart rates up to the 130s.  An hour later his heart rate went back down.  He reported drinking 1 cup of coffee, sweet tea, and some wine that day.  He also reported that he continued to have elevated stress related to his work.  He did take 25 mg of metoprolol tartrate twice daily for 3 days and his heart rate came back down.  He asked about antianxiety medication.  We reviewed the importance of maintaining hydration and avoiding triggers.  He expressed understanding.  I asked him to  follow-up with his PCP related to antianxiety medication.  He did ask about CBD medication as well which I advised against due to his apixaban.  His EKG showed sinus rhythm with questionable AV block left axis deviation left lower branch block 64 bpm.  We  planned to have him follow-up as scheduled with Dr. Swaziland.  He was seen in follow-up by Dr. Swaziland on 12/18/2021.  He did note increased heart rate with typical symptoms of atrial flutter and fatigue.  His heart rate on his Fitbit would be low and when he was working at his computer would go up to 135 bpm.  He was avoiding caffeine and wine.  AAD therapy was discussed.  He is not a candidate for IC agents due to coronary artery disease.  Follow-up with the EP was recommended.  He was seen by Alphonzo Severance, PA-C 12/20/2021.  During that time he continued to go in and out of rhythm.  He described heart rates in the 60s/80s and rapid heart rates up  to the 150s.  He was in atrial flutter at the appointment.  Rhythm control options were reviewed.  Tikosyn and amiodarone.  He wished to proceed with Tikosyn therapy.  His QTc was noted to be 482 with LBBB which manually corrected to 440-450 ms.  His metoprolol was continued.  He was again seen by Dr. Swaziland on 03/30/2022.  He was no longer on Tikosyn.  He was monitoring his heart rate with a personal Kardia mobile device.  He reported compliance with his apixaban.  Follow-up was planned for 6 months.  He presents to the clinic today for follow-up evaluation and states he feels well.  He continues to have intermittent episodes of atrial fibrillation.  He reports his episodes of atrial fibrillation tend to coincide with increased stress.  With less stress at work he feels well.  He is cardiac unaware.  His EKG today shows sinus rhythm first-degree AV block left bundle branch block 65 bpm.  His blood pressure is well-controlled at 118/74.  He reports compliance with his medications.  He denies bleeding issues.  Will  plan follow-up in 9 months.  Today he denies chest pain, shortness of breath, lower extremity edema, fatigue, palpitations, melena, hematuria, hemoptysis, diaphoresis, weakness, presyncope, syncope, orthopnea, and PND.       Home Medications    Prior to Admission medications   Medication Sig Start Date End Date Taking? Authorizing Provider  apixaban (ELIQUIS) 5 MG TABS tablet Take 1 tablet (5 mg total) by mouth 2 (two) times daily. 01/24/21   Duke Salvia, MD  cholecalciferol (VITAMIN D) 25 MCG (1000 UNIT) tablet Take 1,000 Units by mouth daily.    [provider]  empagliflozin (JARDIANCE) 10 MG TABS tablet Take 1 tablet (10 mg total) by mouth daily before breakfast. 02/27/21   Swaziland, Peter M, MD  metoprolol tartrate (LOPRESSOR) 25 MG tablet Take 0.5 tablets (12.5 mg total) by mouth 2 (two) times daily. 11/01/21   Bhagat, Sharrell Ku, PA  Multiple Vitamins-Minerals (ZINC PO) Take 250 mg by mouth daily.    [provider]  rosuvastatin (CRESTOR) 10 MG tablet Take 1 tablet (10 mg total) by mouth daily. 03/20/21   Swaziland, Peter M, MD  sildenafil (VIAGRA) 25 MG tablet Take 25 mg by mouth daily as needed for erectile dysfunction.    [provider]  vitamin C (ASCORBIC ACID) 500 MG tablet Take 500 mg by mouth daily.    [provider]    Family History    Family History  Problem Relation Age of Onset   Liver cancer Mother 44   Esophageal cancer Father 39   Healthy Daughter    Heart disease Neg Hx    He indicated that his mother is deceased. He indicated that his father is deceased. He indicated that his daughter is alive. He indicated that the status of his neg hx is unknown.  Social History    Social History   Socioeconomic History   Marital status: Married    Spouse name: Not on file   Number of children: Not on file   Years of education: Not on file   Highest education level: Not on file  Occupational History   Not on file  Tobacco Use    Smoking status: Never   Smokeless tobacco: Never   Tobacco comments:    Never smoke 12/20/21  Vaping Use   Vaping Use: Never used  Substance and Sexual Activity   Alcohol use: Yes    Comment: 1  beer per month   Drug use: No   Sexual activity: Not on file  Other Topics Concern   Not on file  Social History Narrative   Lives in [place]   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  Cardiovascular:  No chest pain, dyspnea on exertion, edema, orthopnea, palpitations, paroxysmal nocturnal dyspnea. Dermatological: No rash, lesions/masses Respiratory: No cough, dyspnea Urologic: No hematuria, dysuria Abdominal:   No nausea, vomiting, diarrhea, bright red blood per rectum, melena, or hematemesis Neurologic:  No visual changes, wkns, changes in mental status. All other systems reviewed and are otherwise negative except as noted above.  Physical Exam    VS:  BP 118/74   Pulse 65   Ht 6' 1.5" (1.867 m)   Wt 222 lb 3.2 oz (100.8 kg)   SpO2 96%   BMI 28.92 kg/m  , BMI Body mass index is 28.92 kg/m. GEN: Well nourished, well developed, in no acute distress. HEENT: normal. Neck: Supple, no JVD, carotid bruits, or masses. Cardiac: RRR, no murmurs, rubs, or gallops. No clubbing, cyanosis, euvolemic.  Radials/DP/PT 2+ and equal bilaterally.  Respiratory:  Respirations regular and unlabored, clear to auscultation bilaterally. GI: Soft, nontender, nondistended, BS + x 4. MS: no deformity or atrophy. Skin: warm and dry, no rash. Neuro:  Strength and sensation are intact. Psych: Normal affect.  Accessory Clinical Findings    Recent  Labs: 08/29/2022: ALT 20; BUN 17; Creatinine, Ser 0.81; Hemoglobin 17.1; Magnesium 2.3; Platelets 188; Potassium 5.2; Sodium 141   Recent Lipid Panel    Component Value Date/Time   CHOL 135 08/29/2022 1042   TRIG 122 08/29/2022 1042   HDL 51 08/29/2022 1042   CHOLHDL 2.6 08/29/2022 1042   LDLCALC 62 08/29/2022 1042    ECG personally reviewed by me today-sinus rhythm with first-degree AV block left bundle branch block 65 bpm  11/23/2021 EKG today normal sinus rhythm with first-degree AV block left axis deviation left bundle branch block 64 bpm  EKG 11/08/2021 sinus bradycardia first-degree AV block left bundle branch block 57 bpm.    TEE 11/01/2021  IMPRESSIONS     1. No LAA clot. DCC x 1 with 150 J converted to NSR No immediate  neurologic sequelae On Rx anticoagulation with no missed doses.   2. Left ventricular ejection fraction, by estimation, is 55 to 60%. The  left ventricle has normal function.   3. Right ventricular systolic function is normal. The right ventricular  size is normal.   4. Post MAZE with LA clipping no residual lumen to appendage noted . Left  atrial size was moderately dilated. No left atrial/left atrial appendage  thrombus was detected.   5. Right atrial size was moderately dilated.   6. The mitral valve is normal in structure. Mild mitral valve  regurgitation.   7. The aortic valve is tricuspid. Aortic valve regurgitation is not  visualized.   Assessment & Plan   1. Atrial flutter/atrial fibrillation-heart rate today 65.  Status post successful TEE and cardioversion 11/01/2021.  He contacted the nurse triage line on 11/17/2021 and noted that  his heart rate had been varying between 70 and 125.  His blood pressure was well controlled.  Compliant with apixaban and denies bleeding issues.  Was referred to A-fib clinic and options for management were reviewed.  He decided not to pursue Tikosyn therapy. Continue apixaban, metoprolol May continue to take an  extra 6.25 mg of metoprolol as needed for extended periods of irregular/accelerated heart rate. Avoid triggers caffeine, chocolate, EtOH, dehydration etc.-reviewed  Coronary artery disease-no chest pain today.  With CABG 2019 with two-vessel and maze, and LAA clipping. Tikosyn was discontinued 6/19.  Echo 10/22 showed an increase to LVEF to 55-60%, mild MR and mild AS. Continue metoprolol, rosuvastatin Heart healthy low-sodium diet Increase physical activity as tolerated  Chronic systolic CHF-continues to be able to perform baseline activities.  TEE showed LVEF of 55-60%.  Details above.  Sherryll Burger was held during hospitalization due to hyperkalemia.  Plan to restart Entresto if potassium allows. Continue metoprolol, Jardiance Heart healthy low-sodium diet Increase physical activity as tolerated   Hyperlipidemia-LDL 62 on 08/29/22. Continue rosuvastatin Heart healthy low-sodium high-fiber diet  Increase physical activity as tolerated   Disposition: Follow-up with Dr. Swaziland or me in 9-12 months.  Thomasene Ripple. Avrey Hyser NP-C    09/04/2022, 10:55 AM [facility] [address] Office [phone] Fax [phone]  Notice: This dictation was prepared with Dragon dictation along with smaller phrase technology. Any transcriptional errors that result from this process are unintentional and may not be corrected upon review.  I spent 14 minutes examining this patient, reviewing medications, and using patient centered shared decision making involving her cardiac care.  Prior to her visit I spent greater than 20 minutes reviewing her past medical history,  medications, and prior cardiac tests.

## 2022-09-04 ENCOUNTER — Ambulatory Visit: Payer: Medicare Other | Attending: General Practice | Admitting: General Practice

## 2022-09-04 ENCOUNTER — Encounter: Payer: Self-pay | Admitting: General Practice

## 2022-09-04 VITALS — BP 118/74 | HR 65 | Ht 73.5 in | Wt 222.2 lb

## 2022-09-04 DIAGNOSIS — I5022 Chronic systolic (congestive) heart failure: Secondary | ICD-10-CM | POA: Diagnosis not present

## 2022-09-04 DIAGNOSIS — E785 Hyperlipidemia, unspecified: Secondary | ICD-10-CM | POA: Insufficient documentation

## 2022-09-04 DIAGNOSIS — I484 Atypical atrial flutter: Secondary | ICD-10-CM | POA: Diagnosis not present

## 2022-09-04 DIAGNOSIS — I25810 Atherosclerosis of coronary artery bypass graft(s) without angina pectoris: Secondary | ICD-10-CM | POA: Diagnosis not present

## 2022-09-04 NOTE — Patient Instructions (Signed)
Medication Instructions:  The current medical regimen is effective;  continue present plan and medications as directed. Please refer to the Current Medication list given to you today.  *If you need a refill on your cardiac medications before your next appointment, please call your pharmacy*  Lab Work: NONE If you have labs (blood work) drawn today and your tests are completely normal, you will receive your results only by:  MyChart Message (if you have MyChart) OR A paper copy in the mail If you have any lab test that is abnormal or we need to change your treatment, we will call you to review the results.  Other Instructions INCREASE PHYSICAL ACTIVITY AS TOLERATED    Follow-Up: At [facility], you and your health needs are our priority.  As part of our continuing mission to provide you with exceptional heart care, we have created designated Provider Care Teams.  These Care Teams include your primary Cardiologist (physician) and Advanced Practice Providers (APPs -  Physician Assistants and Nurse Practitioners) who all work together to provide you with the care you need, when you need it.  Your next appointment:   9-12 month(s)  Provider:   Peter Swaziland, MD  or Edd Fabian, FNP

## 2022-09-05 DIAGNOSIS — I2510 Atherosclerotic heart disease of native coronary artery without angina pectoris: Secondary | ICD-10-CM | POA: Diagnosis not present

## 2022-09-05 DIAGNOSIS — M1000 Idiopathic gout, unspecified site: Secondary | ICD-10-CM | POA: Diagnosis not present

## 2022-09-05 DIAGNOSIS — Z0000 Encounter for general adult medical examination without abnormal findings: Secondary | ICD-10-CM | POA: Diagnosis not present

## 2022-09-05 DIAGNOSIS — I700 Atherosclerosis of aorta: Secondary | ICD-10-CM | POA: Diagnosis not present

## 2022-09-05 DIAGNOSIS — I255 Ischemic cardiomyopathy: Secondary | ICD-10-CM | POA: Diagnosis not present

## 2022-09-05 DIAGNOSIS — I480 Paroxysmal atrial fibrillation: Secondary | ICD-10-CM | POA: Diagnosis not present

## 2022-09-05 DIAGNOSIS — Z1211 Encounter for screening for malignant neoplasm of colon: Secondary | ICD-10-CM | POA: Diagnosis not present

## 2022-09-05 DIAGNOSIS — L602 Onychogryphosis: Secondary | ICD-10-CM | POA: Diagnosis not present

## 2022-09-05 DIAGNOSIS — Z1331 Encounter for screening for depression: Secondary | ICD-10-CM | POA: Diagnosis not present

## 2022-09-05 DIAGNOSIS — E7800 Pure hypercholesterolemia, unspecified: Secondary | ICD-10-CM | POA: Diagnosis not present

## 2022-09-05 DIAGNOSIS — I5022 Chronic systolic (congestive) heart failure: Secondary | ICD-10-CM | POA: Diagnosis not present

## 2022-09-05 DIAGNOSIS — E1142 Type 2 diabetes mellitus with diabetic polyneuropathy: Secondary | ICD-10-CM | POA: Diagnosis not present

## 2022-09-19 ENCOUNTER — Ambulatory Visit (INDEPENDENT_AMBULATORY_CARE_PROVIDER_SITE_OTHER): Payer: Medicare Other | Admitting: Podiatry

## 2022-09-19 ENCOUNTER — Encounter: Payer: Self-pay | Admitting: Podiatry

## 2022-09-19 DIAGNOSIS — M79674 Pain in right toe(s): Secondary | ICD-10-CM | POA: Diagnosis not present

## 2022-09-19 DIAGNOSIS — M2041 Other hammer toe(s) (acquired), right foot: Secondary | ICD-10-CM

## 2022-09-19 DIAGNOSIS — B351 Tinea unguium: Secondary | ICD-10-CM | POA: Diagnosis not present

## 2022-09-19 DIAGNOSIS — M2042 Other hammer toe(s) (acquired), left foot: Secondary | ICD-10-CM | POA: Diagnosis not present

## 2022-09-19 DIAGNOSIS — E119 Type 2 diabetes mellitus without complications: Secondary | ICD-10-CM

## 2022-09-19 DIAGNOSIS — M79675 Pain in left toe(s): Secondary | ICD-10-CM

## 2022-09-19 NOTE — Progress Notes (Signed)
  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 male presents with the above complaint. History confirmed with patient.  His diabetes is well-controlled he takes [place].  Has a history of heart disease and A-fib and takes Eliquis for this.  The nails are thickened misshapened and discolored and he has difficulty cutting them.  Starting to cause him discomfort.  He also has a toe on the right foot that rubs on shoes and causes blisters to form  Objective:  Physical Exam: warm, good capillary refill, no trophic changes or ulcerative lesions, normal DP and PT pulses, and normal sensory exam.  Both feet show multiple dystrophic yellowed discolored nail plates with subungual debris and hammertoe deformities that are semireducible with the exception of the right second toe which is nonreducible and has a small blister on the dorsal PIPJ  Assessment:   1. Hammertoe of left foot   2. Hammertoe of right foot   3. Pain due to onychomycosis of toenails of both feet   4. Encounter for diabetic foot exam [facility])      Plan:  Patient was evaluated and treated and all questions answered.  Patient educated on diabetes. Discussed proper diabetic foot care and discussed risks and complications of disease. Educated patient in depth on reasons to return to the office immediately should he/she discover anything concerning or new on the feet. All questions answered. Discussed proper shoes as well.   Discussed the etiology and treatment options for the condition in detail with the patient. Educated patient on the topical and oral treatment options for mycotic nails.  I am doubtful topical therapy will be successful with the thickness, oral treatment options limited due  to his use of Eliquis.  Recommended debridement of the nails today. Sharp and mechanical debridement performed of all painful and mycotic nails today. Nails debrided in length and thickness using a nail nipper to level of comfort. Discussed treatment options including appropriate shoe gear. Follow up as needed for painful nails.  We discussed etiology and treatment options of hammertoe deformities, currently these are rubbing on his shoes on the right foot causing a small blister to form.  We discussed the risk of ulceration here.  He says it limits what types of shoes he is able to wear.  Discussed surgical and nonsurgical treatment.  He will consider his option for surgery and return to see me when he is ready to schedule and we will take x-rays and discuss a surgical treatment plan  Return in about 3 months (around 12/20/2022), or if symptoms worsen or fail to improve.

## 2022-09-21 ENCOUNTER — Encounter: Payer: Self-pay | Admitting: Podiatry

## 2022-09-25 ENCOUNTER — Ambulatory Visit (INDEPENDENT_AMBULATORY_CARE_PROVIDER_SITE_OTHER): Payer: Medicare Other | Admitting: Podiatry

## 2022-09-25 ENCOUNTER — Ambulatory Visit (INDEPENDENT_AMBULATORY_CARE_PROVIDER_SITE_OTHER): Payer: Medicare Other

## 2022-09-25 DIAGNOSIS — M2041 Other hammer toe(s) (acquired), right foot: Secondary | ICD-10-CM

## 2022-09-26 ENCOUNTER — Encounter: Payer: Self-pay | Admitting: Cardiology

## 2022-09-26 ENCOUNTER — Telehealth: Payer: Self-pay | Admitting: Cardiology

## 2022-09-26 NOTE — Telephone Encounter (Signed)
   Pre-operative Risk Assessment    Patient Name: Hector Clark  DOB: 04/02/1946 MRN: [id number]      Request for Surgical Clearance    Procedure:   Hammer Toe Repair with Capsulotomy   Date of Surgery:  Clearance 10/12/22                                 Surgeon:  Dr. Sharl Ma  Surgeon's Group or Practice Name:  [facility]  Phone number:  [phone] Fax number:  [phone]   Type of Clearance Requested:   - Medical  - Pharmacy:  Hold        Type of Anesthesia:   Choice    Additional requests/questions:  Please advise surgeon/provider what medications should be held.  Signed, April Henson   09/26/2022, 2:42 PM

## 2022-09-26 NOTE — Telephone Encounter (Signed)
Hector Clark,  You saw this patient on 09/04/2022. Will you please comment on medical clearance for hammer toe repair with capsulotomy?  Please route your response to P CV DIV Preop. I will communicate with requesting office once you have given recommendations.   Thank you!  [name], NP

## 2022-09-27 NOTE — Telephone Encounter (Signed)
   Patient Name: Hector Clark  DOB: [birth year] MRN: [id number]  Primary Cardiologist: Peter Swaziland, MD  Chart reviewed as part of pre-operative protocol coverage. Pre-op clearance already addressed by colleagues in earlier phone notes. To summarize recommendations:  -Per office protocol, patient can hold Eliquis for 1-2 days prior to procedure.   Yes, he may proceed with hammertoe repair.  No further testing is needed prior to surgical repair.  Thank you.   Thomasene Ripple. [place] NP-C   Will route this bundled recommendation to requesting provider via Epic fax function and remove from pre-op pool. Please call with questions.  Sharlene Dory, PA-C 09/27/2022, 8:36 AM

## 2022-09-27 NOTE — Telephone Encounter (Signed)
Patient with diagnosis of afib on Eliquis for anticoagulation.    Procedure: Hammer Toe Repair with Capsulotomy  Date of procedure: 10/12/22  CHA2DS2-VASc Score = 5  This indicates a 7.2% annual risk of stroke. The patient's score is based upon: CHF History: 1 HTN History: 0 Diabetes History: 1 Stroke History: 0 Vascular Disease History: 1 Age Score: 2 Gender Score: 0   Has HTN listed on problem list but BP have consistently been on the low end and he doesn't take BP meds, will not count.  CrCl 157mL/min using adj body weight Platelet count 188K  Per office protocol, patient can hold Eliquis for 1-2 days prior to procedure.    **This guidance is not considered finalized until pre-operative APP has relayed final recommendations.**

## 2022-10-01 NOTE — Progress Notes (Signed)
  Subjective:  Patient ID: Hector Clark, male    DOB: [birth year],  MRN: [id number]  Chief Complaint  Patient presents with   Hector Clark    Surgery consult    77 y.o. male presents with the above complaint. History confirmed with patient.  His diabetes is well-controlled he takes [place].  Shoe gear changes and padding not helpful for toe, interested in surgery now  Objective:  Physical Exam: warm, good capillary refill, no trophic changes or ulcerative lesions, normal DP and PT pulses, and normal sensory exam.  Both feet show multiple dystrophic yellowed discolored nail plates with subungual debris and hammertoe deformities that are semireducible with the exception of the right second toe which is nonreducible and has a small blister on the dorsal PIPJ  Assessment:   1. Hammertoe of right foot      Plan:  Patient was evaluated and treated and all questions answered.  Discussed the etiology and treatment including surgical and non surgical treatment for painful hammertoes. He has exhausted all non surgical treatment prior to this visit including shoe gear changes and padding. He desires surgical intervention. We discussed all risks including but not limited to: pain, swelling, infection, scar, numbness which may be temporary or permanent, chronic pain, stiffness, nerve pain or damage, wound healing problems, bone healing problems including delayed or non-union and recurrence. Specifically we discussed the following procedures: Hammertoe correction of the right second toe with PIPJ arthrodesis and capsulotomy second MTPJ. Informed consent was signed today. Surgery will be scheduled at a mutually agreeable date. Information regarding this will be forwarded to our surgery scheduler.    Surgical plan:  Procedure: -Right second hammertoe correction and capsulotomy MPJ  Location: -[facility]  Anesthesia plan: -IV sedation with local  Postoperative pain plan: - Tylenol 1000 mg every 6  hours,  gabapentin 300 mg every 8 hours x5 days, oxycodone 5 mg 1-2 tabs every 6 hours only as needed  DVT prophylaxis: -he may resume his eliquis after surgery  WB Restrictions / DME needs: -WBAT in surgical shoe  No follow-ups on file.

## 2022-10-08 ENCOUNTER — Telehealth: Payer: Self-pay

## 2022-10-08 NOTE — Telephone Encounter (Signed)
Hector Clark called to reschedule his surgery with Dr. Lilian Kapur on 10/12/2022. He is sick. I have him rescheduled to 12/21/2022. Notified Dr. Lilian Kapur and Aram Beecham at [facility]

## 2022-10-16 DIAGNOSIS — H2513 Age-related nuclear cataract, bilateral: Secondary | ICD-10-CM | POA: Diagnosis not present

## 2022-10-18 ENCOUNTER — Encounter: Payer: Medicare Other | Admitting: Podiatry

## 2022-10-19 ENCOUNTER — Encounter: Payer: Self-pay | Admitting: Cardiology

## 2022-10-19 NOTE — Telephone Encounter (Signed)
Patient asking for surgical clearance.  Talked to [place], she states they do NOT need clearance if he has had no issues in the past year.  She sees his history in 2019 and that had cardioversion last June but being a year ago is fine.  They do not require holding of Eliquis.  They will re-send the clearance form today, but it is not necessary unless doctor thinks he should not have the surgery.  She states can call her today until 4 or on Monday to let her know to proceed.   Please advise if any reason not to have the cataract surgery.

## 2022-10-19 NOTE — Telephone Encounter (Signed)
Spoke with nicole that Dr [place] is OK with the surgery for patient.  No clearance is needed.  She will make a note so he is good to go.  Patient made aware via My Chart

## 2022-10-23 DIAGNOSIS — H2511 Age-related nuclear cataract, right eye: Secondary | ICD-10-CM | POA: Diagnosis not present

## 2022-11-01 ENCOUNTER — Encounter: Payer: Medicare Other | Admitting: Podiatry

## 2022-11-13 ENCOUNTER — Other Ambulatory Visit: Payer: Self-pay | Admitting: Internal Medicine

## 2022-11-13 ENCOUNTER — Ambulatory Visit
Admission: RE | Admit: 2022-11-13 | Discharge: 2022-11-13 | Disposition: A | Discharge status: Home / Self Care | Payer: Medicare Other | Source: Ambulatory Visit | Attending: Internal Medicine | Admitting: Internal Medicine

## 2022-11-13 DIAGNOSIS — R0781 Pleurodynia: Secondary | ICD-10-CM

## 2022-11-13 DIAGNOSIS — R079 Chest pain, unspecified: Secondary | ICD-10-CM | POA: Diagnosis not present

## 2022-11-13 DIAGNOSIS — I517 Cardiomegaly: Secondary | ICD-10-CM | POA: Diagnosis not present

## 2022-11-21 ENCOUNTER — Encounter: Payer: Medicare Other | Admitting: Podiatry

## 2022-11-28 ENCOUNTER — Encounter: Payer: Self-pay | Admitting: Podiatry

## 2022-12-07 ENCOUNTER — Ambulatory Visit (INDEPENDENT_AMBULATORY_CARE_PROVIDER_SITE_OTHER): Payer: Medicare Other | Admitting: Podiatry

## 2022-12-07 ENCOUNTER — Encounter: Payer: Self-pay | Admitting: Podiatry

## 2022-12-07 DIAGNOSIS — B351 Tinea unguium: Secondary | ICD-10-CM

## 2022-12-07 DIAGNOSIS — M79674 Pain in right toe(s): Secondary | ICD-10-CM

## 2022-12-07 DIAGNOSIS — M79675 Pain in left toe(s): Secondary | ICD-10-CM

## 2022-12-07 NOTE — Progress Notes (Signed)
This patient presents to the office with chief complaint of long thick painful nails.  Patient says the nails are painful walking and wearing shoes.  This patient is unable to self treat.  This patient is unable to trim his  nails since she is unable to reach heis nails.  She presents to the office for preventative foot care services.  General Appearance  Alert, conversant and in no acute stress.  Vascular  Dorsalis pedis and posterior tibial  pulses are palpable  bilaterally.  Capillary return is within normal limits  bilaterally. Temperature is within normal limits  bilaterally.  Neurologic  Senn-Weinstein monofilament wire test within normal limits  bilaterally. Muscle power within normal limits bilaterally.  Nails Thick disfigured discolored nails with subungual debris  from hallux to fifth toes bilaterally. No evidence of bacterial infection or drainage bilaterally.  Orthopedic  No limitations of motion  feet .  No crepitus or effusions noted.  No bony pathology or digital deformities noted. Rigid second toe right foot.   Skin  normotropic skin with no porokeratosis noted bilaterally.  No signs of infections or ulcers noted.     Onychomycosis  Nails  B/L.  Pain in right toes  Pain in left toes  Debridement of nails both feet followed trimming the nails with dremel tool.    RTC 4 months.   Helane Gunther DPM

## 2022-12-27 ENCOUNTER — Encounter: Payer: Medicare Other | Admitting: Podiatry

## 2022-12-28 ENCOUNTER — Other Ambulatory Visit: Payer: Self-pay | Admitting: Podiatry

## 2022-12-28 DIAGNOSIS — M7751 Other enthesopathy of right foot: Secondary | ICD-10-CM | POA: Diagnosis not present

## 2022-12-28 DIAGNOSIS — M24574 Contracture, right foot: Secondary | ICD-10-CM | POA: Diagnosis not present

## 2022-12-28 DIAGNOSIS — M2041 Other hammer toe(s) (acquired), right foot: Secondary | ICD-10-CM | POA: Diagnosis not present

## 2022-12-28 MED ORDER — GABAPENTIN 300 MG PO CAPS: 300.0000 mg | ORAL_CAPSULE | Freq: Three times a day (TID) | ORAL | 0 refills | Status: AC

## 2022-12-28 MED ORDER — OXYCODONE HCL 5 MG PO TABS: 5.0000 mg | ORAL_TABLET | ORAL | 0 refills | Status: AC | PRN

## 2022-12-28 MED ORDER — ACETAMINOPHEN 500 MG PO TABS: 1000.0000 mg | ORAL_TABLET | Freq: Four times a day (QID) | ORAL | 0 refills | Status: AC | PRN

## 2022-12-28 NOTE — Progress Notes (Signed)
R 2nd hammertoe correction and MTP capsulotomy

## 2023-01-03 ENCOUNTER — Ambulatory Visit (INDEPENDENT_AMBULATORY_CARE_PROVIDER_SITE_OTHER): Payer: Medicare Other

## 2023-01-03 ENCOUNTER — Encounter: Payer: Self-pay | Admitting: Podiatry

## 2023-01-03 ENCOUNTER — Ambulatory Visit (INDEPENDENT_AMBULATORY_CARE_PROVIDER_SITE_OTHER): Payer: Medicare Other | Admitting: Podiatry

## 2023-01-03 DIAGNOSIS — M7751 Other enthesopathy of right foot: Secondary | ICD-10-CM | POA: Diagnosis not present

## 2023-01-03 DIAGNOSIS — M2041 Other hammer toe(s) (acquired), right foot: Secondary | ICD-10-CM

## 2023-01-03 DIAGNOSIS — Z9889 Other specified postprocedural states: Secondary | ICD-10-CM

## 2023-01-03 MED ORDER — CEPHALEXIN 500 MG PO CAPS
500.0000 mg | ORAL_CAPSULE | Freq: Three times a day (TID) | ORAL | 0 refills | Status: AC
Start: 2023-01-03 — End: 2023-01-10

## 2023-01-03 NOTE — Progress Notes (Signed)
  Subjective:  Patient ID: Hector Clark, male    DOB: [birth year],  MRN: [id number]  Chief Complaint  Patient presents with   Routine Post Op    "It's good."    DOS: 12/28/2022 Procedure: Right second hammertoe correction, capsulotomy MPJ  77 y.o. male returns for post-op check.  He is doing well-healing took Tylenol for pain  Review of Systems: Negative except as noted in the HPI. Denies N/V/F/Ch.   Objective:  There were no vitals filed for this visit. There is no height or weight on file to calculate BMI. Constitutional Well developed. Well nourished.  Vascular Foot warm and well perfused. Capillary refill normal to all digits.  Calf is soft and supple, no posterior calf or knee pain, negative Homans' sign  Neurologic Normal speech. Oriented to person, place, and time. Epicritic sensation to light touch grossly present bilaterally.  Dermatologic Skin healing well there is slight erythema but no purulent drainage no ascending cellulitis, erythema is isolated to the incision on the toe  Orthopedic: He has no tenderness to palpation noted about the surgical site.   Multiple view plain film radiographs: Good correction noted of deformity Kirschner wire intact Assessment:   1. Status post surgery   2. Hammertoe of right foot    Plan:  Patient was evaluated and treated and all questions answered.  S/p foot surgery right -Progressing as expected post-operatively. -XR: Noted above no complication -WB Status: WBAT in surgical shoe -Sutures: Return 2 weeks to remove. -Medications: Rx Keflex sent to pharmacy for incisional erythema suspect this is likely inflammatory as opposed to true infection but will send as a precaution -Foot redressed.  Return in about 2 weeks (around 01/17/2023) for post op (no x-rays), suture removal.

## 2023-01-07 ENCOUNTER — Other Ambulatory Visit: Payer: Self-pay

## 2023-01-07 MED ORDER — METOPROLOL TARTRATE 25 MG PO TABS: 12.5000 mg | ORAL_TABLET | Freq: Two times a day (BID) | ORAL | 2 refills | Status: DC

## 2023-01-10 ENCOUNTER — Encounter: Payer: Medicare Other | Admitting: Podiatry

## 2023-01-14 ENCOUNTER — Encounter: Payer: Self-pay | Admitting: Podiatry

## 2023-01-15 MED ORDER — CEPHALEXIN 500 MG PO CAPS: 500.0000 mg | ORAL_CAPSULE | Freq: Four times a day (QID) | ORAL | 0 refills | Status: AC

## 2023-01-17 ENCOUNTER — Encounter: Payer: Self-pay | Admitting: Podiatry

## 2023-01-17 ENCOUNTER — Ambulatory Visit (INDEPENDENT_AMBULATORY_CARE_PROVIDER_SITE_OTHER): Payer: Medicare Other

## 2023-01-17 ENCOUNTER — Ambulatory Visit (INDEPENDENT_AMBULATORY_CARE_PROVIDER_SITE_OTHER): Payer: Medicare Other | Admitting: Podiatry

## 2023-01-17 ENCOUNTER — Encounter: Payer: Medicare Other | Admitting: Podiatry

## 2023-01-17 DIAGNOSIS — M2041 Other hammer toe(s) (acquired), right foot: Secondary | ICD-10-CM

## 2023-01-17 DIAGNOSIS — M7751 Other enthesopathy of right foot: Secondary | ICD-10-CM

## 2023-01-17 DIAGNOSIS — Z9889 Other specified postprocedural states: Secondary | ICD-10-CM

## 2023-01-17 NOTE — Progress Notes (Signed)
  Subjective:  Patient ID: Hector Clark, male    DOB: 1945/10/04,  MRN: [id number]  Chief Complaint  Patient presents with   Routine Post Op    "I think I might have tugged on the pin a little bit while pulling my pants off."    DOS: 12/28/2022 Procedure: Right second hammertoe correction, capsulotomy MPJ  77 y.o. male returns for post-op check.  He is doing well he thinks the pin may have bumped and pulled out a little bit, he just got the antibiotics and has not taken them yet  Review of Systems: Negative except as noted in the HPI. Denies N/V/F/Ch.   Objective:  There were no vitals filed for this visit. There is no height or weight on file to calculate BMI. Constitutional Well developed. Well nourished.  Vascular Foot warm and well perfused. Capillary refill normal to all digits.  Calf is soft and supple, no posterior calf or knee pain, negative Homans' sign  Neurologic Normal speech. Oriented to person, place, and time. Epicritic sensation to light touch grossly present bilaterally.  Dermatologic Erythema has resolved, there is no active drainage, the incision is well-healed, the Kirschner wire has migrated slightly distal  Orthopedic: He has no tenderness to palpation noted about the surgical site.   Multiple view plain film radiographs: Kirschner wire still positioned fairly well and IPJ, it has translated distally about half a centimeter Assessment:   1. Status post surgery    Plan:  Patient was evaluated and treated and all questions answered.  S/p foot surgery right -Sutures removed uneventfully.  Kirschner wire was cut short and recapped.  He may gently wash the foot, at this point the erythema has resolved and likely was not an infection but rather inflammatory so do not think he needs the antibiotics further at this point.  He will follow-up me in 3 weeks we will take final x-rays to remove the Kirschner wire and he will return to regular shoe gear at that  point.  Return in about 2 weeks (around 01/31/2023) for post op (new x-rays).

## 2023-01-31 ENCOUNTER — Encounter: Payer: Medicare Other | Admitting: Podiatry

## 2023-02-07 ENCOUNTER — Ambulatory Visit (INDEPENDENT_AMBULATORY_CARE_PROVIDER_SITE_OTHER): Payer: Medicare Other | Admitting: Podiatry

## 2023-02-07 ENCOUNTER — Encounter: Payer: Self-pay | Admitting: Podiatry

## 2023-02-07 ENCOUNTER — Ambulatory Visit (INDEPENDENT_AMBULATORY_CARE_PROVIDER_SITE_OTHER): Payer: Medicare Other

## 2023-02-07 VITALS — BP 130/74 | HR 73 | Temp 98.0°F | Resp 18 | Ht 73.0 in | Wt 222.0 lb

## 2023-02-07 DIAGNOSIS — Z9889 Other specified postprocedural states: Secondary | ICD-10-CM

## 2023-02-07 DIAGNOSIS — M7751 Other enthesopathy of right foot: Secondary | ICD-10-CM | POA: Diagnosis not present

## 2023-02-07 DIAGNOSIS — M2041 Other hammer toe(s) (acquired), right foot: Secondary | ICD-10-CM

## 2023-02-09 NOTE — Progress Notes (Signed)
Subjective:  Patient ID: Hector Clark, male    DOB: Oct 08, 1945,  MRN: [id number]  Chief Complaint  Patient presents with   Routine Post Op    POV #3 DOS 12/28/2022 RT FOOT HAMMERTOE CORRECTION     DOS: 12/28/2022 Procedure: Right second hammertoe correction, capsulotomy MPJ  77 y.o. male returns for post-op check.  He is doing well Pin may have bumped a little more  Review of Systems: Negative except as noted in the HPI. Denies N/V/F/Ch.   Objective:   Vitals:   02/07/23 1406  BP: 130/74  Pulse: 73  Resp: 18  Temp: 98 F (36.7 C)  SpO2: 98%   Body mass index is 29.29 kg/m. Constitutional Well developed. Well nourished.  Vascular Foot warm and well perfused. Capillary refill normal to all digits.  Calf is soft and supple, no posterior calf or knee pain, negative Homans' sign  Neurologic Normal speech. Oriented to person, place, and time. Epicritic sensation to light touch grossly present bilaterally.  Dermatologic Incisions are well-healed no signs of infection  Orthopedic: He has no tenderness to palpation noted about the surgical site.   Multiple view plain film radiographs: Kirschner wire translated a little further distal, good consolidation across fusion site Assessment:   1. Status post surgery   2. Hammertoe of right foot    Plan:  Patient was evaluated and treated and all questions answered.  S/p foot surgery right -Pin removed uneventfully.  May resume regular shoe gear and activity.  Return to me in 6 weeks for final follow-up.  Return in about 6 weeks (around 03/21/2023) for post op (new x-rays).

## 2023-03-11 DIAGNOSIS — E1169 Type 2 diabetes mellitus with other specified complication: Secondary | ICD-10-CM | POA: Diagnosis not present

## 2023-03-11 DIAGNOSIS — M1000 Idiopathic gout, unspecified site: Secondary | ICD-10-CM | POA: Diagnosis not present

## 2023-03-11 DIAGNOSIS — Z1283 Encounter for screening for malignant neoplasm of skin: Secondary | ICD-10-CM | POA: Diagnosis not present

## 2023-03-11 DIAGNOSIS — I2510 Atherosclerotic heart disease of native coronary artery without angina pectoris: Secondary | ICD-10-CM | POA: Diagnosis not present

## 2023-03-11 DIAGNOSIS — I255 Ischemic cardiomyopathy: Secondary | ICD-10-CM | POA: Diagnosis not present

## 2023-03-11 DIAGNOSIS — Z1211 Encounter for screening for malignant neoplasm of colon: Secondary | ICD-10-CM | POA: Diagnosis not present

## 2023-03-11 DIAGNOSIS — I700 Atherosclerosis of aorta: Secondary | ICD-10-CM | POA: Diagnosis not present

## 2023-03-11 DIAGNOSIS — E7800 Pure hypercholesterolemia, unspecified: Secondary | ICD-10-CM | POA: Diagnosis not present

## 2023-03-11 DIAGNOSIS — I5022 Chronic systolic (congestive) heart failure: Secondary | ICD-10-CM | POA: Diagnosis not present

## 2023-03-11 DIAGNOSIS — I480 Paroxysmal atrial fibrillation: Secondary | ICD-10-CM | POA: Diagnosis not present

## 2023-03-11 DIAGNOSIS — E1142 Type 2 diabetes mellitus with diabetic polyneuropathy: Secondary | ICD-10-CM | POA: Diagnosis not present

## 2023-03-21 ENCOUNTER — Encounter: Payer: Medicare Other | Admitting: Podiatry

## 2023-03-25 ENCOUNTER — Ambulatory Visit (INDEPENDENT_AMBULATORY_CARE_PROVIDER_SITE_OTHER): Payer: Medicare Other

## 2023-03-25 ENCOUNTER — Ambulatory Visit (INDEPENDENT_AMBULATORY_CARE_PROVIDER_SITE_OTHER): Payer: Medicare Other | Admitting: Podiatry

## 2023-03-25 DIAGNOSIS — Z9889 Other specified postprocedural states: Secondary | ICD-10-CM

## 2023-03-25 DIAGNOSIS — M2041 Other hammer toe(s) (acquired), right foot: Secondary | ICD-10-CM

## 2023-03-25 NOTE — Progress Notes (Signed)
  Subjective:  Patient ID: Hector Clark, male    DOB: July 08, 1945,  MRN: [id number]  Chief Complaint  Patient presents with   Routine Post Op    RIGHT, "DOING WELL" NO PAIN, DENIES N/V/F/C/SOB    DOS: 12/28/2022 Procedure: Right second hammertoe correction, capsulotomy MPJ  77 y.o. male returns for post-op check.  He is doing well  Review of Systems: Negative except as noted in the HPI. Denies N/V/F/Ch.   Objective:   There were no vitals filed for this visit.  There is no height or weight on file to calculate BMI. Constitutional Well developed. Well nourished.  Vascular Foot warm and well perfused. Capillary refill normal to all digits.  Calf is soft and supple, no posterior calf or knee pain, negative Homans' sign  Neurologic Normal speech. Oriented to person, place, and time. Epicritic sensation to light touch grossly present bilaterally.  Dermatologic Incisions are well-healed   Orthopedic: He has no tenderness to palpation noted about the surgical site.   Multiple view plain film radiographs: Delayed fusion of hammertoe PIPJ arthrodesis site Assessment:   1. Status post surgery    Plan:  Patient was evaluated and treated and all questions answered.  S/p foot surgery right -We reviewed his x-rays.  We discussed the presence of the delayed bone healing, likely due to the loss of fixation of the pin.  This has led to some loss of correction but so far is still significantly improved from his preoperative position.  So far seems to be asymptomatic does not have any pain and he is wearing shoes and back to his full activities.  He will let me know if it has any further trouble for him.  No follow-ups on file.

## 2023-04-10 ENCOUNTER — Ambulatory Visit (INDEPENDENT_AMBULATORY_CARE_PROVIDER_SITE_OTHER): Payer: Medicare Other | Admitting: Podiatry

## 2023-04-10 ENCOUNTER — Encounter: Payer: Self-pay | Admitting: Podiatry

## 2023-04-10 DIAGNOSIS — M79675 Pain in left toe(s): Secondary | ICD-10-CM | POA: Diagnosis not present

## 2023-04-10 DIAGNOSIS — B351 Tinea unguium: Secondary | ICD-10-CM

## 2023-04-10 DIAGNOSIS — M79674 Pain in right toe(s): Secondary | ICD-10-CM | POA: Diagnosis not present

## 2023-04-10 NOTE — Progress Notes (Signed)
This patient presents to the office with chief complaint of long thick painful nails.  Patient says the nails are painful walking and wearing shoes.  This patient is unable to self treat.  This patient is unable to trim his  nails since she is unable to reach heis nails.  She presents to the office for preventative foot care services.  General Appearance  Alert, conversant and in no acute stress.  Vascular  Dorsalis pedis and posterior tibial  pulses are palpable  bilaterally.  Capillary return is within normal limits  bilaterally. Temperature is within normal limits  bilaterally.  Neurologic  Senn-Weinstein monofilament wire test within normal limits  bilaterally. Muscle power within normal limits bilaterally.  Nails Thick disfigured discolored nails with subungual debris  from hallux to fifth toes bilaterally. No evidence of bacterial infection or drainage bilaterally.  Orthopedic  No limitations of motion  feet .  No crepitus or effusions noted.  No bony pathology or digital deformities noted. Rigid second toe right foot.   Skin  normotropic skin with no porokeratosis noted bilaterally.  No signs of infections or ulcers noted.     Onychomycosis  Nails  B/L.  Pain in right toes  Pain in left toes  Debridement of nails both feet followed trimming the nails with dremel tool.    RTC 3  months.   Helane Gunther DPM

## 2023-06-20 ENCOUNTER — Other Ambulatory Visit: Payer: Self-pay | Admitting: Cardiology

## 2023-07-11 ENCOUNTER — Ambulatory Visit: Payer: Medicare Other | Admitting: Podiatry

## 2023-07-17 ENCOUNTER — Ambulatory Visit (INDEPENDENT_AMBULATORY_CARE_PROVIDER_SITE_OTHER): Payer: Medicare Other | Admitting: Podiatry

## 2023-07-17 ENCOUNTER — Encounter: Payer: Self-pay | Admitting: Podiatry

## 2023-07-17 DIAGNOSIS — M79674 Pain in right toe(s): Secondary | ICD-10-CM | POA: Diagnosis not present

## 2023-07-17 DIAGNOSIS — B351 Tinea unguium: Secondary | ICD-10-CM | POA: Diagnosis not present

## 2023-07-17 DIAGNOSIS — M79675 Pain in left toe(s): Secondary | ICD-10-CM | POA: Diagnosis not present

## 2023-07-17 NOTE — Progress Notes (Signed)
 This patient presents to the office with chief complaint of long thick painful nails.  Patient says the nails are painful walking and wearing shoes.  This patient is unable to self treat.  This patient is unable to trim his  nails since she is unable to reach heis nails.  She presents to the office for preventative foot care services.  General Appearance  Alert, conversant and in no acute stress.  Vascular  Dorsalis pedis and posterior tibial  pulses are palpable  bilaterally.  Capillary return is within normal limits  bilaterally. Temperature is within normal limits  bilaterally.  Neurologic  Senn-Weinstein monofilament wire test within normal limits  bilaterally. Muscle power within normal limits bilaterally.  Nails Thick disfigured discolored nails with subungual debris  from hallux to fifth toes bilaterally. No evidence of bacterial infection or drainage bilaterally.  Orthopedic  No limitations of motion  feet .  No crepitus or effusions noted.  No bony pathology or digital deformities noted. Rigid second toe right foot.   Skin  normotropic skin with no porokeratosis noted bilaterally.  No signs of infections or ulcers noted.     Onychomycosis  Nails  B/L.  Pain in right toes  Pain in left toes  Debridement of nails both feet followed trimming the nails with dremel tool.    RTC 3  months.   Helane Gunther DPM

## 2023-08-02 NOTE — Telephone Encounter (Signed)
Message sent to Dr.Jordan for advice. 

## 2023-08-29 ENCOUNTER — Ambulatory Visit: Payer: Medicare Other | Admitting: General Practice

## 2023-09-10 DIAGNOSIS — R748 Abnormal levels of other serum enzymes: Secondary | ICD-10-CM | POA: Diagnosis not present

## 2023-09-10 DIAGNOSIS — I480 Paroxysmal atrial fibrillation: Secondary | ICD-10-CM | POA: Diagnosis not present

## 2023-09-10 DIAGNOSIS — E7800 Pure hypercholesterolemia, unspecified: Secondary | ICD-10-CM | POA: Diagnosis not present

## 2023-09-10 DIAGNOSIS — Z79899 Other long term (current) drug therapy: Secondary | ICD-10-CM | POA: Diagnosis not present

## 2023-09-10 DIAGNOSIS — M1000 Idiopathic gout, unspecified site: Secondary | ICD-10-CM | POA: Diagnosis not present

## 2023-09-10 DIAGNOSIS — Z1211 Encounter for screening for malignant neoplasm of colon: Secondary | ICD-10-CM | POA: Diagnosis not present

## 2023-09-10 DIAGNOSIS — Z1283 Encounter for screening for malignant neoplasm of skin: Secondary | ICD-10-CM | POA: Diagnosis not present

## 2023-09-10 DIAGNOSIS — Z1331 Encounter for screening for depression: Secondary | ICD-10-CM | POA: Diagnosis not present

## 2023-09-10 DIAGNOSIS — I5022 Chronic systolic (congestive) heart failure: Secondary | ICD-10-CM | POA: Diagnosis not present

## 2023-09-10 DIAGNOSIS — I255 Ischemic cardiomyopathy: Secondary | ICD-10-CM | POA: Diagnosis not present

## 2023-09-10 DIAGNOSIS — E1142 Type 2 diabetes mellitus with diabetic polyneuropathy: Secondary | ICD-10-CM | POA: Diagnosis not present

## 2023-09-10 DIAGNOSIS — I2510 Atherosclerotic heart disease of native coronary artery without angina pectoris: Secondary | ICD-10-CM | POA: Diagnosis not present

## 2023-09-10 DIAGNOSIS — G629 Polyneuropathy, unspecified: Secondary | ICD-10-CM | POA: Diagnosis not present

## 2023-09-10 DIAGNOSIS — Z0000 Encounter for general adult medical examination without abnormal findings: Secondary | ICD-10-CM | POA: Diagnosis not present

## 2023-09-18 NOTE — Progress Notes (Unsigned)
 Cardiology Clinic Note   Patient Name: Hector Clark Date of Encounter: 09/24/2023  Primary Care Provider:  Lysle Saunas, MD (Inactive) Primary Cardiologist:  Peter Swaziland, MD  Patient Profile    Hector Clark 78 year old male presents to the clinic today for follow-up evaluation of his persistent atrial fibrillation and coronary artery disease.  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   LIMA to LAD and SVG to OM with [facility] via right thigh   S/P Maze operation for atrial fibrillation 06/24/2017   Left atrial lesion set using bipolar radiofrequency and cryothermy ablation via conventional median sternotomy with clipping of LA appendage   Visit for monitoring Tikosyn  therapy 06/18/2017   Past Surgical History:  Procedure Laterality Date   bone graph     CARDIOVERSION N/A 03/25/2017   Procedure: CARDIOVERSION;  Surgeon: Jacqueline Matsu, MD;  Location: [facility] ENDOSCOPY;  Service: Cardiovascular;  Laterality: N/A;   CARDIOVERSION N/A 11/25/2020   Procedure: CARDIOVERSION;  Surgeon: Sheryle Donning, MD;  Location: [facility] ENDOSCOPY;  Service: Cardiovascular;  Laterality: N/A;   CARDIOVERSION N/A 11/01/2021   Procedure: CARDIOVERSION;  Surgeon: Loyde Rule, MD;  Location: [facility] ENDOSCOPY;  Service: Cardiovascular;  Laterality: N/A;   CLIPPING OF ATRIAL APPENDAGE N/A 06/24/2017   Procedure: CLIPPING OF ATRIAL APPENDAGE;  Surgeon: Gardenia Jump, MD;  Location: [facility] OR;  Service: Open Heart Surgery;  Laterality: N/A;   CORONARY ARTERY BYPASS GRAFT N/A 06/24/2017   Procedure: CORONARY ARTERY BYPASS GRAFTING (CABG) times two utilizing left anterior mammary artery and right greater saphenous vein harvested endoscopically;  Surgeon: Gardenia Jump, MD;  Location: [facility] OR;  Service: Open Heart Surgery;   Laterality: N/A;   IABP INSERTION N/A 06/24/2017   Procedure: IABP Insertion;  Surgeon: Swaziland, Peter M, MD;  Location: [facility] INVASIVE CV LAB;  Service: Cardiovascular;  Laterality: N/A;   LEFT HEART CATH AND CORONARY ANGIOGRAPHY N/A 06/24/2017   Procedure: LEFT HEART CATH AND CORONARY ANGIOGRAPHY;  Surgeon: Swaziland, Peter M, MD;  Location: [facility] INVASIVE CV LAB;  Service: Cardiovascular;  Laterality: N/A;   MAZE N/A 06/24/2017   Procedure: MAZE;  Surgeon: Gardenia Jump, MD;  Location: [facility] OR;  Service: Open Heart Surgery;  Laterality: N/A;   TEE WITHOUT CARDIOVERSION N/A 06/24/2017   Procedure: TRANSESOPHAGEAL ECHOCARDIOGRAM (TEE);  Surgeon: Gardenia Jump, MD;  Location: [facility] OR;  Service: Open Heart Surgery;  Laterality: N/A;   TEE WITHOUT CARDIOVERSION N/A 11/25/2020   Procedure: TRANSESOPHAGEAL ECHOCARDIOGRAM (TEE);  Surgeon: Sheryle Donning, MD;  Location: [facility] ENDOSCOPY;  Service: Cardiovascular;  Laterality: N/A;   TEE WITHOUT CARDIOVERSION N/A 11/01/2021   Procedure: TRANSESOPHAGEAL ECHOCARDIOGRAM (TEE);  Surgeon: Loyde Rule, MD;  Location: [facility] ENDOSCOPY;  Service: Cardiovascular;  Laterality: N/A;   TOE SURGERY Left    4th toe   tophus      Allergies  Allergies  Allergen Reactions   Atorvastatin  Other (See Comments)    Other reaction(s): Muscle pain    History of Present Illness    Hector Clark has a PMH of atrial fibrillation, coronary artery disease status post CABG x2 LIMA-LAD and SVG-OM with maze and LAA clipping 2/19, NSTEMI, HTN, hyperlipidemia, and leg cramps.  His Tikosyn  was discontinued 6/19.  He  had not had a recurrence of atrial fibrillation since his maze procedure until an event was noted via Fitbit 6/22.  An EKG confirmed atrial flutter with RVR.  He underwent TEE and DCCV on 7/22.  His LVEF was noted to be 35-40%.  His echocardiogram 10/22 showed an LVEF of 55-60%, mild MR, mild AS.    He presented to the emergency department with complaints of tachycardia.  He  again noted his heart rate was elevated on his Fitbit.  He was mildly short of breath with elevated rates.  He did not take his morning medications prior to presenting to the emergency department.  His EKG showed narrow complex tachycardia to the 1 atrial flutter with a rate of 134 bpm.  He was started on IV diltiazem  and did not have significant improvement.  He underwent TEE cardioversion on 11/01/2020.  He received 1 shock with 150 J.  He tolerated procedure well.  He presented to the clinic 11/08/21 for follow-up evaluation and stated he felt well.  We reviewed his cardioversion.  He was cardiac unaware.  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  He regularly monitored his Fitbit.  He did note that when he had atrial fibrillation his blood pressure also decreased and his fatigue increased.  He also noted that he had missed a few doses of his medication.  We reviewed the importance of avoiding triggers.  I will asked him to increase his physical activity, gave the salty 6 diet sheet, continued his current medication regimen, planned repeat BMP in 2 weeks (will resume Entresto  at that time based on K) and plan follow-up as scheduled.  We contacted the nurse triage line on 11/17/2021 and reported episodes of irregular heart rates.  He presented to the clinic 11/23/21 for follow-up evaluation stated last week he noted elevated heart rates intermittently.  It woke him from sleep at night on Thursday.  He noted heart rates up to the 130s.  An hour later his heart rate went back down.  He reported drinking 1 cup of coffee, sweet tea, and some wine that day.  He also reported that he continued to have elevated stress related to his work.  He did take 25 mg of metoprolol  tartrate twice daily for 3 days and his heart rate came back down.  He asked about antianxiety medication.  We reviewed the importance of maintaining hydration and avoiding triggers.  He expressed understanding.  I asked  him to follow-up with his PCP related to antianxiety medication.  He did ask about CBD medication as well which I advised against due to his apixaban .  His EKG showed sinus rhythm with questionable AV block left axis deviation left lower branch block 64 bpm.  We  planned to have him follow-up as scheduled with Dr. Swaziland.  He was seen in follow-up by Dr. Swaziland on 12/18/2021.  He did note increased heart rate with typical symptoms of atrial flutter and fatigue.  His heart rate on his Fitbit would be low and when he was working at his computer would go up to 135 bpm.  He was avoiding caffeine and wine.  AAD therapy was discussed.  He is not a candidate for IC agents due to coronary artery disease.  Follow-up with the EP was recommended.  He was seen by Valeda Garter, PA-C 12/20/2021.  During that time he continued to go in and out of rhythm.  He described heart rates in the 60s/80s and  rapid heart rates up to the 150s.  He was in atrial flutter at the appointment.  Rhythm control options were reviewed.  Tikosyn  and amiodarone.  He wished to proceed with Tikosyn  therapy.  His QTc was noted to be 482 with LBBB which manually corrected to 440-450 ms.  His metoprolol  was continued.  He was again seen by Dr. Swaziland on 03/30/2022.  He was no longer on Tikosyn .  He was monitoring his heart rate with a personal Kardia mobile device.  He reported compliance with his apixaban .  Follow-up was planned for 6 months.    He presented to the clinic 09/04/22 for follow-up evaluation and stated he felt well.  He continued to have intermittent episodes of atrial fibrillation.  He reported his episodes of atrial fibrillation tended to coincide with increased stress.  With less stress at work he felt well.  He was cardiac unaware.  His EKG  showed sinus rhythm first-degree AV block left bundle branch block 65 bpm.  His blood pressure was well-controlled at 118/74.  He reported compliance with his medications.  He denieed bleeding  issues.  I planned follow-up in 9 months.  He contacted the office via MyChart and reported he had increased his Jardiance  to 25 mg.  He had reduced his rosuvastatin  to 5 mg.  He also reported that he had lost about 10 pounds.  He reports to the clinic today for follow-up evaluation and states he continues to work on his diet and is losing weight.  He would like to get his A1c below 7.  His blood pressure today is 96/58.  We reviewed his medication.  I recommended that he continue his rosuvastatin  at 10 mg daily.  He brings in lab work from the [place].  He continues to get medications from the [place] as well.  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  He has contracts which he renews and works on.  He plans to do this for another 12 months before retiring.  I will continue his current medication regimen, have him maintain his physical activity.  I will also give him the Aspro support stocking sheet.  Will plan follow-up in 9 to 12 months.  Today he denies chest pain, shortness of breath, lower extremity edema, fatigue, palpitations, melena, hematuria, hemoptysis, diaphoresis, weakness, presyncope, syncope, orthopnea, and PND.       Home Medications    Prior to Admission medications   Medication Sig Start Date End Date Taking? Authorizing Provider  apixaban  (ELIQUIS ) 5 MG TABS tablet Take 1 tablet (5 mg total) by mouth 2 (two) times daily. 01/24/21   Verona Goodwill, MD  cholecalciferol (VITAMIN D) 25 MCG (1000 UNIT) tablet Take 1,000 Units by mouth daily.    [provider]  empagliflozin  (JARDIANCE ) 10 MG TABS tablet Take 1 tablet (10 mg total) by mouth daily before breakfast. 02/27/21   Swaziland, Peter M, MD  metoprolol  tartrate (LOPRESSOR ) 25 MG tablet Take 0.5 tablets (12.5 mg total) by mouth 2 (two) times daily. 11/01/21   Bhagat, Annia Kilts, PA  Multiple Vitamins-Minerals (ZINC PO) Take 250 mg by mouth daily.    [provider]  rosuvastatin  (CRESTOR ) 10 MG tablet Take 1 tablet (10 mg  total) by mouth daily. 03/20/21   Swaziland, Peter M, MD  sildenafil (VIAGRA) 25 MG tablet Take 25 mg by mouth daily as needed for erectile dysfunction.    [provider]  vitamin C (ASCORBIC ACID) 500 MG tablet Take 500 mg by mouth  daily.    [provider]    Family History    Family History  Problem Relation Age of Onset   Liver cancer Mother 41   Esophageal cancer Father 62   Healthy Daughter    Heart disease Neg Hx    He indicated that his mother is deceased. He indicated that his father is deceased. He indicated that his daughter is alive. He indicated that the status of his neg hx is unknown.  Social History    Social History   Socioeconomic History   Marital status: Married    Spouse name: Not on file   Number of children: Not on file   Years of education: Not on file   Highest education level: Not on file  Occupational History   Not on file  Tobacco Use   Smoking status: Never   Smokeless tobacco: Never   Tobacco comments:    Never smoke 12/20/21  Vaping Use   Vaping status: Never Used  Substance and Sexual Activity   Alcohol use: Not Currently    Comment: 1 beer per month   Drug use: No   Sexual activity: Not on file  Other Topics Concern   Not on file  Social History Narrative   Lives in [place]   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, minerals for IT   Social Drivers of Corporate investment banker Strain: Not on file  Food Insecurity: Not on file  Transportation Needs: Not on file  Physical Activity: Not on file  Stress: Not on file  Social Connections: Unknown (10/03/2021)   Received from [facility]   Social Network    Social Network: Not on file  Intimate Partner Violence: Unknown (08/25/2021)   Received from [facility]   HITS    Physically Hurt: Not on file    Insult or Talk Down To: Not on file    Threaten Physical Harm: Not on file    Scream or Curse: Not on file     Review of Systems    General:   No chills, fever, night sweats or weight changes.  Cardiovascular:  No chest pain, dyspnea on exertion, edema, orthopnea, palpitations, paroxysmal nocturnal dyspnea. Dermatological: No rash, lesions/masses Respiratory: No cough, dyspnea Urologic: No hematuria, dysuria Abdominal:   No nausea, vomiting, diarrhea, bright red blood per rectum, melena, or hematemesis Neurologic:  No visual changes, wkns, changes in mental status. All other systems reviewed and are otherwise negative except as noted above.  Physical Exam    VS:  BP (!) 96/58 (BP Location: Left Arm, Patient Position: Sitting, Cuff Size: Normal)   Pulse 75   Ht 6\' 2"  (1.88 m)   Wt 220 lb (99.8 kg)   SpO2 97%   BMI 28.25 kg/m  , BMI Body mass index is 28.25 kg/m. GEN: Well nourished, well developed, in no acute distress. HEENT: normal. Neck: Supple, no JVD, carotid bruits, or masses. Cardiac: RRR, no murmurs, rubs, or gallops. No clubbing, cyanosis, euvolemic.  Radials/DP/PT 2+ and equal bilaterally.  Respiratory:  Respirations regular and unlabored, clear to auscultation bilaterally. GI: Soft, nontender, nondistended, BS + x 4. MS: no deformity or atrophy. Skin: warm and dry, no rash. Neuro:  Strength and sensation are intact. Psych: Normal affect.  Accessory Clinical Findings    Recent Labs: No results found for requested labs within last 365 days.   Recent Lipid Panel    Component Value Date/Time   CHOL 135 08/29/2022 1042  TRIG 122 08/29/2022 1042   HDL 51 08/29/2022 1042   CHOLHDL 2.6 08/29/2022 1042   LDLCALC 62 08/29/2022 1042    ECG personally reviewed by me today-EKG Interpretation Date/Time:  Tuesday Sep 24 2023 16:24:53 EDT Ventricular Rate:  70 PR Interval:  254 QRS Duration:  162 QT Interval:  450 QTC Calculation: 486 R Axis:   -18  Text Interpretation: Sinus rhythm with 1st degree A-V block Left bundle branch block When compared with ECG of 20-Dec-2021 08:59, Sinus rhythm has replaced  Atrial flutter Confirmed by Lawana Pray [phone]) on 09/24/2023 4:27:37 PM   09/04/22 sinus rhythm with first-degree AV block left bundle branch block 65 bpm  11/23/2021 EKG today normal sinus rhythm with first-degree AV block left axis deviation left bundle branch block 64 bpm  EKG 11/08/2021 sinus bradycardia first-degree AV block left bundle branch block 57 bpm.    TEE 11/01/2021  IMPRESSIONS     1. No LAA clot. DCC x 1 with 150 J converted to NSR No immediate  neurologic sequelae On Rx anticoagulation with no missed doses.   2. Left ventricular ejection fraction, by estimation, is 55 to 60%. The  left ventricle has normal function.   3. Right ventricular systolic function is normal. The right ventricular  size is normal.   4. Post MAZE with LA clipping no residual lumen to appendage noted . Left  atrial size was moderately dilated. No left atrial/left atrial appendage  thrombus was detected.   5. Right atrial size was moderately dilated.   6. The mitral valve is normal in structure. Mild mitral valve  regurgitation.   7. The aortic valve is tricuspid. Aortic valve regurgitation is not  visualized.   Assessment & Plan   1.Hyperlipidemia-LDL 71 on 07/12/23  He contacted the office on 09/18/2023 and reported that he had cut back his rosuvastatin  from 10 to 5 mg daily.  Discussed importance of maintaining good lipid control especially with history of two-vessel CABG. Continue rosuvastatin  at 10 mg daily Heart healthy low-sodium high-fiber diet  maintain physical activity Continue weight loss   Atrial flutter/atrial fibrillation-heart rate today 75 bpm.  Status post successful TEE and cardioversion 11/01/2021.   Was previously referred to A-fib clinic and options for management were reviewed.  He decided not to pursue Tikosyn  therapy at that time. Continue apixaban , metoprolol  May continue to take an extra 6.25 mg of metoprolol  as needed for extended periods of  irregular/accelerated heart rate. Avoid triggers caffeine, chocolate, EtOH, dehydration etc.-again reviewed.  Coronary artery disease-denies anginal type symptoms.  Denies exertional chest discomfort.  With CABG 2019 with two-vessel and maze, and LAA clipping. Tikosyn  was discontinued 6/19.  Echo 10/22 showed an increase to LVEF to 55-60%, mild MR and mild AS. Continue metoprolol , rosuvastatin  Heart healthy low-sodium diet Increase physical activity as tolerated  Chronic systolic CHF-no decreased activity tolerance or increased DOE.  TEE showed LVEF of 55-60%.  Details above.  Entresto  was held during hospitalization due to hyperkalemia.  Plan to restart Entresto  if potassium allows. Continue metoprolol , Jardiance  Heart healthy low-sodium diet Increase physical activity as tolerated   Type 2 diabetes-hemoglobin A1c 7 on 08/29/2022.  He increased his Jardiance  to 25 mg from 12.5.  He has lost around 10 pounds. Continue Jardiance  Carb modified diet Maintain weight Increase physical activity    Disposition: Follow-up with Dr. Swaziland or me in 9-12 months.  Hector Clark. Hector Vanriper NP-C    09/24/2023, 4:19 PM [facility] [address] Office [phone] Fax [phone]  Notice: This dictation was prepared with Dragon dictation along with smaller phrase technology. Any transcriptional errors that result from this process are unintentional and may not be corrected upon review.  I spent 14 minutes examining this patient, reviewing medications, and using patient centered shared decision making involving her cardiac care.  I spent  20 minutes reviewing her past medical history,  medications, and prior cardiac tests.

## 2023-09-24 ENCOUNTER — Encounter: Payer: Self-pay | Admitting: General Practice

## 2023-09-24 ENCOUNTER — Ambulatory Visit: Attending: General Practice | Admitting: General Practice

## 2023-09-24 VITALS — BP 96/58 | HR 75 | Ht 74.0 in | Wt 220.0 lb

## 2023-09-24 DIAGNOSIS — I484 Atypical atrial flutter: Secondary | ICD-10-CM | POA: Insufficient documentation

## 2023-09-24 DIAGNOSIS — E785 Hyperlipidemia, unspecified: Secondary | ICD-10-CM | POA: Insufficient documentation

## 2023-09-24 DIAGNOSIS — I5022 Chronic systolic (congestive) heart failure: Secondary | ICD-10-CM | POA: Diagnosis not present

## 2023-09-24 DIAGNOSIS — I25810 Atherosclerosis of coronary artery bypass graft(s) without angina pectoris: Secondary | ICD-10-CM | POA: Diagnosis not present

## 2023-09-24 MED ORDER — EMPAGLIFLOZIN 25 MG PO TABS: 25.0000 mg | ORAL_TABLET | Freq: Every day | ORAL | 3 refills | Status: AC

## 2023-09-24 NOTE — Patient Instructions (Signed)
 Medication Instructions:  Make sure to continue physical activity.  Continue to maintain a heart health diet.    *If you need a refill on your cardiac medications before your next appointment, please call your pharmacy*   Lab Work: No labs were ordered during today's visit.  If you have labs (blood work) drawn today and your tests are completely normal, you will receive your results only by: MyChart Message (if you have MyChart) OR A paper copy in the mail If you have any lab test that is abnormal or we need to change your treatment, we will call you to review the results.  Lawana Pray  recommends you purchase some compression  socks/hose from [employer] in [place] ,[place]. You do not need an prescription to purchase the items.  Address  [address] [place], [place] [address]  Phone  [phone]   Compression   strength    8-15 mmHg 15-20 mmHg                           20-30 mmHg  30-40 mmHg.  You may also try a medical supply store, department store (i.e.- [employer], [employer], [employer], specialty shoe stores ( shoe market), [employer] and [employer]) or  Ship broker uniform store.  Follow-Up: At [facility], you and your health needs are our priority.  As part of our continuing mission to provide you with exceptional heart care, we have created designated Provider Care Teams.  These Care Teams include your primary Cardiologist (physician) and Advanced Practice Providers (APPs -  Physician Assistants and Nurse Practitioners) who all work together to provide you with the care you need, when you need it.  We recommend signing up for the patient portal called "MyChart".  Sign up information is provided on this After Visit Summary.  MyChart is used to connect with patients for Virtual Visits (Telemedicine).  Patients are able to view lab/test results, encounter notes, upcoming appointments, etc.  Non-urgent messages can be sent to your provider as well.   To  learn more about what you can do with MyChart, go to [email or web address].    Your next appointment:   9 month(s)  Provider:   Lawana Pray, NP          Other Instructions Thank you for choosing [facility]!

## 2023-10-03 ENCOUNTER — Other Ambulatory Visit: Payer: Self-pay | Admitting: Cardiology

## 2023-10-15 ENCOUNTER — Ambulatory Visit (INDEPENDENT_AMBULATORY_CARE_PROVIDER_SITE_OTHER): Payer: Medicare Other | Admitting: Podiatry

## 2023-10-15 ENCOUNTER — Encounter: Payer: Self-pay | Admitting: Podiatry

## 2023-10-15 DIAGNOSIS — M79675 Pain in left toe(s): Secondary | ICD-10-CM

## 2023-10-15 DIAGNOSIS — M79674 Pain in right toe(s): Secondary | ICD-10-CM | POA: Diagnosis not present

## 2023-10-15 DIAGNOSIS — B351 Tinea unguium: Secondary | ICD-10-CM | POA: Diagnosis not present

## 2023-10-15 NOTE — Progress Notes (Signed)
 This patient presents to the office with chief complaint of long thick painful nails.  Patient says the nails are painful walking and wearing shoes.  This patient is unable to self treat.  This patient is unable to trim his  nails since she is unable to reach heis nails.  She presents to the office for preventative foot care services.  General Appearance  Alert, conversant and in no acute stress.  Vascular  Dorsalis pedis and posterior tibial  pulses are palpable  bilaterally.  Capillary return is within normal limits  bilaterally. Temperature is within normal limits  bilaterally.  Neurologic  Senn-Weinstein monofilament wire test within normal limits  bilaterally. Muscle power within normal limits bilaterally.  Nails Thick disfigured discolored nails with subungual debris  from hallux to fifth toes bilaterally. No evidence of bacterial infection or drainage bilaterally.  Orthopedic  No limitations of motion  feet .  No crepitus or effusions noted.  No bony pathology or digital deformities noted. Rigid second toe right foot.   Skin  normotropic skin with no porokeratosis noted bilaterally.  No signs of infections or ulcers noted.     Onychomycosis  Nails  B/L.  Pain in right toes  Pain in left toes  Debridement of nails both feet followed trimming the nails with dremel tool.    RTC 3  months.   Helane Gunther DPM

## 2023-10-29 DIAGNOSIS — E119 Type 2 diabetes mellitus without complications: Secondary | ICD-10-CM | POA: Diagnosis not present

## 2023-10-29 DIAGNOSIS — M25562 Pain in left knee: Secondary | ICD-10-CM | POA: Diagnosis not present

## 2023-11-17 ENCOUNTER — Other Ambulatory Visit: Payer: Self-pay | Admitting: General Practice

## 2023-11-28 DIAGNOSIS — M545 Low back pain, unspecified: Secondary | ICD-10-CM | POA: Diagnosis not present

## 2023-12-02 DIAGNOSIS — H18821 Corneal disorder due to contact lens, right eye: Secondary | ICD-10-CM | POA: Diagnosis not present

## 2023-12-02 DIAGNOSIS — E119 Type 2 diabetes mellitus without complications: Secondary | ICD-10-CM | POA: Diagnosis not present

## 2023-12-02 DIAGNOSIS — H26491 Other secondary cataract, right eye: Secondary | ICD-10-CM | POA: Diagnosis not present

## 2023-12-02 DIAGNOSIS — H5213 Myopia, bilateral: Secondary | ICD-10-CM | POA: Diagnosis not present

## 2023-12-02 DIAGNOSIS — H2512 Age-related nuclear cataract, left eye: Secondary | ICD-10-CM | POA: Diagnosis not present

## 2023-12-02 DIAGNOSIS — H52223 Regular astigmatism, bilateral: Secondary | ICD-10-CM | POA: Diagnosis not present

## 2023-12-02 DIAGNOSIS — H524 Presbyopia: Secondary | ICD-10-CM | POA: Diagnosis not present

## 2024-01-15 ENCOUNTER — Ambulatory Visit (INDEPENDENT_AMBULATORY_CARE_PROVIDER_SITE_OTHER): Admitting: Podiatry

## 2024-01-15 DIAGNOSIS — M79674 Pain in right toe(s): Secondary | ICD-10-CM

## 2024-01-15 DIAGNOSIS — B351 Tinea unguium: Secondary | ICD-10-CM

## 2024-01-15 DIAGNOSIS — M79675 Pain in left toe(s): Secondary | ICD-10-CM

## 2024-01-15 NOTE — Progress Notes (Signed)
 This patient presents to the office with chief complaint of long thick painful nails.  Patient says the nails are painful walking and wearing shoes.  This patient is unable to self treat.  This patient is unable to trim his  nails since she is unable to reach heis nails.  She presents to the office for preventative foot care services.  General Appearance  Alert, conversant and in no acute stress.  Vascular  Dorsalis pedis and posterior tibial  pulses are palpable  bilaterally.  Capillary return is within normal limits  bilaterally. Temperature is within normal limits  bilaterally.  Neurologic  Senn-Weinstein monofilament wire test within normal limits  bilaterally. Muscle power within normal limits bilaterally.  Nails Thick disfigured discolored nails with subungual debris  from hallux to fifth toes bilaterally. No evidence of bacterial infection or drainage bilaterally.  Orthopedic  No limitations of motion  feet .  No crepitus or effusions noted.  No bony pathology or digital deformities noted. Rigid second toe right foot.   Skin  normotropic skin with no porokeratosis noted bilaterally.  No signs of infections or ulcers noted.     Onychomycosis  Nails  B/L.  Pain in right toes  Pain in left toes  Debridement of nails both feet followed trimming the nails with dremel tool.    RTC 3  months.   Helane Gunther DPM

## 2024-03-09 DIAGNOSIS — E1169 Type 2 diabetes mellitus with other specified complication: Secondary | ICD-10-CM | POA: Diagnosis not present

## 2024-03-09 DIAGNOSIS — Z79899 Other long term (current) drug therapy: Secondary | ICD-10-CM | POA: Diagnosis not present

## 2024-03-09 DIAGNOSIS — E7800 Pure hypercholesterolemia, unspecified: Secondary | ICD-10-CM | POA: Diagnosis not present

## 2024-03-12 DIAGNOSIS — I255 Ischemic cardiomyopathy: Secondary | ICD-10-CM | POA: Diagnosis not present

## 2024-03-12 DIAGNOSIS — I5022 Chronic systolic (congestive) heart failure: Secondary | ICD-10-CM | POA: Diagnosis not present

## 2024-03-12 DIAGNOSIS — R748 Abnormal levels of other serum enzymes: Secondary | ICD-10-CM | POA: Diagnosis not present

## 2024-03-12 DIAGNOSIS — I2510 Atherosclerotic heart disease of native coronary artery without angina pectoris: Secondary | ICD-10-CM | POA: Diagnosis not present

## 2024-03-12 DIAGNOSIS — I480 Paroxysmal atrial fibrillation: Secondary | ICD-10-CM | POA: Diagnosis not present

## 2024-03-12 DIAGNOSIS — M1000 Idiopathic gout, unspecified site: Secondary | ICD-10-CM | POA: Diagnosis not present

## 2024-03-12 DIAGNOSIS — E1142 Type 2 diabetes mellitus with diabetic polyneuropathy: Secondary | ICD-10-CM | POA: Diagnosis not present

## 2024-03-12 DIAGNOSIS — G629 Polyneuropathy, unspecified: Secondary | ICD-10-CM | POA: Diagnosis not present

## 2024-03-12 DIAGNOSIS — E7800 Pure hypercholesterolemia, unspecified: Secondary | ICD-10-CM | POA: Diagnosis not present

## 2024-04-15 ENCOUNTER — Ambulatory Visit: Admitting: Podiatry

## 2024-05-27 ENCOUNTER — Encounter: Payer: Self-pay | Admitting: General Practice

## 2024-11-09 ENCOUNTER — Ambulatory Visit: Admitting: General Practice
